# Patient Record
Sex: Female | Born: 1970 | Race: Black or African American | Hispanic: No | Smoking: Never smoker
Health system: Southern US, Community
[De-identification: ages and names within clinical notes are randomized; demographics above are authoritative.]

## PROBLEM LIST (undated history)

## (undated) ENCOUNTER — Inpatient Hospital Stay (HOSPITAL_COMMUNITY): Payer: Self-pay

## (undated) DIAGNOSIS — Z8669 Personal history of other diseases of the nervous system and sense organs: Secondary | ICD-10-CM

## (undated) DIAGNOSIS — I10 Essential (primary) hypertension: Secondary | ICD-10-CM

## (undated) DIAGNOSIS — E7800 Pure hypercholesterolemia, unspecified: Secondary | ICD-10-CM

## (undated) DIAGNOSIS — R569 Unspecified convulsions: Secondary | ICD-10-CM

## (undated) HISTORY — DX: Personal history of other diseases of the nervous system and sense organs: Z86.69

---

## 2009-12-31 ENCOUNTER — Emergency Department (HOSPITAL_COMMUNITY): Admission: EM | Admit: 2009-12-31 | Discharge: 2009-12-31 | Payer: Self-pay | Admitting: Emergency Medicine

## 2010-02-08 ENCOUNTER — Encounter: Admission: RE | Admit: 2010-02-08 | Discharge: 2010-02-08 | Payer: Self-pay | Admitting: Nephrology

## 2010-11-22 ENCOUNTER — Emergency Department (HOSPITAL_COMMUNITY)
Admission: EM | Admit: 2010-11-22 | Discharge: 2010-11-22 | Disposition: A | Discharge status: Home / Self Care | Payer: Medicaid Other | Attending: Emergency Medicine | Admitting: Emergency Medicine

## 2010-11-22 DIAGNOSIS — Z79899 Other long term (current) drug therapy: Secondary | ICD-10-CM | POA: Insufficient documentation

## 2010-11-22 DIAGNOSIS — G40909 Epilepsy, unspecified, not intractable, without status epilepticus: Secondary | ICD-10-CM | POA: Insufficient documentation

## 2010-11-22 DIAGNOSIS — R5381 Other malaise: Secondary | ICD-10-CM | POA: Insufficient documentation

## 2011-02-06 ENCOUNTER — Other Ambulatory Visit: Payer: Self-pay

## 2011-02-06 LAB — OB RESULTS CONSOLE RPR: RPR: NONREACTIVE

## 2011-02-06 LAB — OB RESULTS CONSOLE ABO/RH

## 2011-02-06 LAB — OB RESULTS CONSOLE HEPATITIS B SURFACE ANTIGEN: Hepatitis B Surface Ag: NEGATIVE

## 2011-02-06 LAB — OB RESULTS CONSOLE ANTIBODY SCREEN: Antibody Screen: NEGATIVE

## 2011-02-08 ENCOUNTER — Other Ambulatory Visit: Payer: Self-pay

## 2011-02-20 LAB — OB RESULTS CONSOLE GC/CHLAMYDIA
Chlamydia: NEGATIVE
Gonorrhea: NEGATIVE

## 2011-02-22 ENCOUNTER — Other Ambulatory Visit (HOSPITAL_COMMUNITY): Payer: Self-pay | Admitting: Nephrology

## 2011-02-22 DIAGNOSIS — O0990 Supervision of high risk pregnancy, unspecified, unspecified trimester: Secondary | ICD-10-CM

## 2011-02-28 ENCOUNTER — Other Ambulatory Visit (HOSPITAL_COMMUNITY): Payer: Medicaid Other

## 2011-03-07 ENCOUNTER — Ambulatory Visit (HOSPITAL_COMMUNITY): Payer: Medicaid Other

## 2011-03-13 ENCOUNTER — Encounter (HOSPITAL_COMMUNITY): Payer: Self-pay

## 2011-03-13 ENCOUNTER — Ambulatory Visit (HOSPITAL_COMMUNITY)
Admission: RE | Admit: 2011-03-13 | Discharge: 2011-03-13 | Disposition: A | Discharge status: Home / Self Care | Payer: Medicaid Other | Source: Ambulatory Visit | Attending: Obstetrics & Gynecology | Admitting: Obstetrics & Gynecology

## 2011-03-13 ENCOUNTER — Other Ambulatory Visit: Payer: Self-pay

## 2011-03-13 ENCOUNTER — Ambulatory Visit (HOSPITAL_COMMUNITY)
Admission: RE | Admit: 2011-03-13 | Discharge: 2011-03-13 | Disposition: A | Discharge status: Home / Self Care | Payer: Medicaid Other | Source: Ambulatory Visit | Attending: Obstetrics | Admitting: Obstetrics

## 2011-03-13 VITALS — BP 148/91 | HR 128 | Wt 184.0 lb

## 2011-03-13 DIAGNOSIS — O351XX0 Maternal care for (suspected) chromosomal abnormality in fetus, not applicable or unspecified: Secondary | ICD-10-CM | POA: Insufficient documentation

## 2011-03-13 DIAGNOSIS — O355XX0 Maternal care for (suspected) damage to fetus by drugs, not applicable or unspecified: Secondary | ICD-10-CM | POA: Insufficient documentation

## 2011-03-13 DIAGNOSIS — O0990 Supervision of high risk pregnancy, unspecified, unspecified trimester: Secondary | ICD-10-CM

## 2011-03-13 DIAGNOSIS — O3510X0 Maternal care for (suspected) chromosomal abnormality in fetus, unspecified, not applicable or unspecified: Secondary | ICD-10-CM | POA: Insufficient documentation

## 2011-03-13 DIAGNOSIS — Z3689 Encounter for other specified antenatal screening: Secondary | ICD-10-CM | POA: Insufficient documentation

## 2011-03-13 DIAGNOSIS — O09529 Supervision of elderly multigravida, unspecified trimester: Secondary | ICD-10-CM | POA: Insufficient documentation

## 2011-03-13 HISTORY — DX: Unspecified convulsions: R56.9

## 2011-03-15 DIAGNOSIS — O09529 Supervision of elderly multigravida, unspecified trimester: Secondary | ICD-10-CM | POA: Insufficient documentation

## 2011-03-15 NOTE — Progress Notes (Signed)
Genetic Counseling  High-Risk Gestation Note  Appointment Date:  03/13/11 Referred By: Coral Ceo, MD Date of Birth:  January 08, 1971  Pregnancy History: [id number] Estimated Date of Delivery: 09/17/11 Estimated Gestational Age: [redacted]w[redacted]d Attending: Dario Ave, MD  Thank you for sending Amanda Lozano for genetic counseling because of a maternal age of 40 y.o. at delivery. The patient was accompanied by her daughter today.      She was counseled regarding maternal age and the association with risk for chromosome conditions due to nondisjunction with aging of the ova.   We reviewed chromosomes, nondisjunction, and the associated 1 in 60 risk for fetal aneuploidy in the first trimester related to a maternal age of 6 at delivery.  She was counseled that the risk for aneuploidy decreases as gestational age increases, accounting for those pregnancies which spontaneously abort.  We specifically discussed Down syndrome (trisomy 66), trisomies 72 and 72, and sex chromosome aneuploidies (47,XXX and 47,XXY) including the common features and prognoses of each.   We reviewed available screening and diagnostic options.  Regarding screening tests, we discussed the options of First screen, Quad screen and ultrasound.  She understands that screening tests are used to modify a patient's a priori risk for aneuploidy, typically based on age.  This estimate provides a pregnancy specific risk assessment.  We also reviewed the availability of diagnostic options including CVS and amniocentesis.  We discussed the risks, limitations, and benefits of each.  After reviewing these options, Amanda Lozano elected to have First screen, but declined CVS and amniocentesis.  She wishes to pursue these options to help ascertain her pregnancy specific risks for aneuploidy.  She understands that ultrasound cannot rule out all birth defects or genetic syndromes. The patient was advised of this limitation and states she still does not want  diagnostic testing at this time.  However, she was counseled that 50-80% of fetuses with Down syndrome and up to 90% of fetuses with trisomies 13 and 18, when well visualized, have detectable anomalies or soft markers by ultrasound.   Amanda Lozano was provided with written information regarding sickle cell anemia (SCA) including the carrier frequency and incidence in the African-American population, the availability of carrier testing and prenatal diagnosis if indicated.  In addition, we discussed that hemoglobinopathies are routinely screened for as part of the [place] newborn screening panel.  Amanda Lozano reported that she previously had sickle cell screening, which was normal.   Both family histories were reviewed and found to be contributory for epilepsy in the patient. Epilepsy occurs in 1% of the population and can have many causes.  Approximately 80% of epilepsy is thought to be idiopathic while the remaining 20% is secondary to a variety of factors such as perinatal events, infections, trauma and genetic disease.  A specific diagnosis in an affected individual is necessary to accurately assess the risk for other family members to develop epilepsy.  In the absence of a known etiology, epilepsy is thought to be caused by a combination of genetic and environmental factors, called multifactorial inheritance. Recurrence risk for idiopathic epilepsy in offspring of affected individuals is approximately 4%, assuming multifactorial inheritance. Without further information regarding the provided family history, an accurate genetic risk cannot be calculated. Further genetic counseling is warranted if more information is obtained.   Amanda Lozano denied exposure to environmental toxins or chemical agents. She denied the use of alcohol, tobacco or street drugs. She denied significant viral illnesses during the course of her pregnancy.  Her medical and  surgical histories were contributory for epilepsy for which she is  treated with carbamazepine. Her last seizure was reportedly Amanda Lozano declined MFM consultation regarding this history. She reported that she is followed by a neurologist. Available study data have indicated that prenatal exposures to Tegretol (Carbamazepine) appear to be associated with an increased risk for a neural tube defect (NTD), such as spina bifida, above the general population risk of approximately 1 in 500.  Conflicting study data exist on whether or not the prenatal use of Tegretol also increase the chance for additional birth defects or possible developmental delays. Available screening and testing options in the current pregnancy include maternal serum AFP screening (up to approximately 80% detection of ONTDs), targeted ultrasound in the second trimester (approximately 90% detection of ONTDs, when well visualized) and amniocentesis (greater than 98% detection of ONTDs). Sometimes the maternal use of medications, dictated by a medical condition such as epilepsy, may even be more beneficial to a pregnancy than not taking the medication(s) at all.  In this instance, controlling Amanda Lozano's seizures is more beneficial to the patient and her pregnancy. She should not alter her medication without first consulting with her physician.   I counseled Amanda Lozano regarding the above risks and available options.  The approximate face-to-face time with the genetic counselor was 40 minutes.  Clydie Braun Kale Dols, MS, [facility] 03/15/2011

## 2011-03-20 ENCOUNTER — Telehealth (HOSPITAL_COMMUNITY): Payer: Self-pay | Admitting: MS"

## 2011-03-20 NOTE — Telephone Encounter (Signed)
First trimester screen results within normal range. Results to patient. Down syndrome risk reduced to 1 in 560, and Trisomy 18/13 risk reduced to 1 in 4,501. Patient happy with these results.

## 2011-03-25 ENCOUNTER — Inpatient Hospital Stay (HOSPITAL_COMMUNITY)
Admission: AD | Admit: 2011-03-25 | Discharge: 2011-03-25 | Disposition: A | Discharge status: Home / Self Care | Payer: Medicaid Other | Source: Ambulatory Visit | Attending: Obstetrics & Gynecology | Admitting: Obstetrics & Gynecology

## 2011-03-25 ENCOUNTER — Encounter (HOSPITAL_COMMUNITY): Payer: Self-pay

## 2011-03-25 DIAGNOSIS — O99891 Other specified diseases and conditions complicating pregnancy: Secondary | ICD-10-CM | POA: Insufficient documentation

## 2011-03-25 DIAGNOSIS — R51 Headache: Secondary | ICD-10-CM | POA: Insufficient documentation

## 2011-03-25 DIAGNOSIS — O26899 Other specified pregnancy related conditions, unspecified trimester: Secondary | ICD-10-CM

## 2011-03-25 LAB — URINE MICROSCOPIC-ADD ON

## 2011-03-25 LAB — URINALYSIS, ROUTINE W REFLEX MICROSCOPIC
Glucose, UA: NEGATIVE mg/dL
Specific Gravity, Urine: 1.02 (ref 1.005–1.030)

## 2011-03-25 MED ORDER — ACETAMINOPHEN 500 MG PO TABS
1000.0000 mg | ORAL_TABLET | Freq: Once | ORAL | Status: AC
  Administered 2011-03-25: 1000 mg via ORAL
  Filled 2011-03-25: qty 2

## 2011-03-25 NOTE — Progress Notes (Signed)
Onset of headache since yesterday has continued have headache has not taken anything concerned about what to take, has seizure disorder since 1997, 15 weeks, seizure free for 4 years.

## 2011-03-25 NOTE — ED Provider Notes (Signed)
History     Chief Complaint  Patient presents with  . Headache   HPI Headache started yesterday night, no vision changes or photophobia, wasn't sure what she could take. H/O seizure disorder, no seizure x 4 years.   OB History    Grav Para Term Preterm Abortions TAB SAB Ect Mult Living   5 3 3  0 1 0 1 0 0 3      Past Medical History  Diagnosis Date  . Seizures     No past surgical history on file.  No family history on file.  History  Substance Use Topics  . Smoking status: Never Smoker   . Smokeless tobacco: Not on file  . Alcohol Use: No    Allergies: No Known Allergies  Prescriptions prior to admission  Medication Sig Dispense Refill  . carbamazepine (TEGRETOL) 100 MG chewable tablet Chew by mouth 2 (two) times daily.        Marland Kitchen PRENATAL VITAMINS PO Take by mouth.          Review of Systems  Constitutional: Negative.   HENT: Negative for hearing loss, ear pain, nosebleeds and congestion.   Eyes: Negative for blurred vision, double vision and photophobia.  Respiratory: Negative.   Cardiovascular: Negative.   Gastrointestinal: Negative.   Genitourinary: Negative.   Musculoskeletal: Negative.   Neurological: Positive for headaches. Negative for dizziness, tingling, tremors, sensory change, speech change, focal weakness, seizures and loss of consciousness.  Psychiatric/Behavioral: Negative.    Physical Exam   Blood pressure 132/81, pulse 115, temperature 98.3 F (36.8 C), temperature source Oral, resp. rate 18, height 5\' 1"  (1.549 m), weight 83.28 kg (183 lb 9.6 oz), last menstrual period 12/11/2010.  Physical Exam  Nursing note and vitals reviewed. Constitutional: She is oriented to person, place, and time. She appears well-developed and well-nourished. No distress.  HENT:  Head: Normocephalic and atraumatic.  Eyes: Conjunctivae and EOM are normal. Pupils are equal, round, and reactive to light.  Cardiovascular: Normal rate.   Respiratory: Effort normal.    Musculoskeletal: Normal range of motion.  Neurological: She is alert and oriented to person, place, and time. No cranial nerve deficit. Coordination normal.  Skin: Skin is warm and dry.  Psychiatric: She has a normal mood and affect.    MAU Course  Procedures Tylenol 1000mg  for headache  Results for orders placed during the hospital encounter of 03/25/11 (from the past 24 hour(s))  URINALYSIS, ROUTINE W REFLEX MICROSCOPIC     Status: Abnormal   Collection Time   03/25/11  9:40 AM      Component Value Range   Color, Urine YELLOW  YELLOW    Appearance CLEAR  CLEAR    Specific Gravity, Urine 1.020  1.005 - 1.030    pH 7.5  5.0 - 8.0    Glucose, UA NEGATIVE  NEGATIVE (mg/dL)   Hgb urine dipstick TRACE (*) NEGATIVE    Bilirubin Urine NEGATIVE  NEGATIVE    Ketones, ur NEGATIVE  NEGATIVE (mg/dL)   Protein, ur NEGATIVE  NEGATIVE (mg/dL)   Urobilinogen, UA 0.2  0.0 - 1.0 (mg/dL)   Nitrite NEGATIVE  NEGATIVE    Leukocytes, UA NEGATIVE  NEGATIVE   URINE MICROSCOPIC-ADD ON     Status: Abnormal   Collection Time   03/25/11  9:40 AM      Component Value Range   Squamous Epithelial / LPF FEW (*) RARE    WBC, UA 0-2  <3 (WBC/hpf)   RBC / HPF 0-2  <  3 (RBC/hpf)   Bacteria, UA RARE  RARE      Assessment and Plan  40 y.o. [id number] at [redacted]w[redacted]d Headache - improved with tylenol Follow up as scheduled, precautions rev'd  Jusitn Salsgiver 03/25/2011, 9:58 AM

## 2011-05-08 NOTE — L&D Delivery Note (Signed)
Cesarean Section Procedure Note  Indications: malpresentation: Breech  Pre-operative Diagnosis: 39 week 2 day pregnancy.  Active labor.  Breech presentation.  Post-operative Diagnosis: same  Surgeon: Coral Ceo A   Assistants: Francoise Ceo  Anesthesia: Spinal anesthesia  ASA Class: 2 - E   Procedure Details   The patient was seen in the Holding Room. The risks, benefits, complications, treatment options, and expected outcomes were discussed with the patient.  The patient concurred with the proposed plan, giving informed consent.  The site of surgery properly noted/marked. The patient was taken to Operating Room # 1, identified as Amanda Lozano and the procedure verified as C-Section Delivery. A Time Out was held and the above information confirmed.  After induction of anesthesia, the patient was draped and prepped in the usual sterile manner. A Pfannenstiel incision was made and carried down through the subcutaneous tissue to the fascia. Fascial incision was made and extended transversely. The fascia was separated from the underlying rectus tissue superiorly and inferiorly. The peritoneum was identified and entered. Peritoneal incision was extended longitudinally. The utero-vesical peritoneal reflection was incised transversely and the bladder flap was bluntly freed from the lower uterine segment. A low transverse uterine incision was made. Delivered from breech Homero Fellers)  presentation was a 3465 gram Female with Apgar scores of 9 at one minute and 9 at five minutes. After the umbilical cord was clamped and cut cord blood was obtained for evaluation. The placenta was removed intact and appeared normal. The uterine outline, tubes and ovaries appeared normal. The uterine incision was closed with running locked sutures of 0 Monocryl.   Hemostasis was observed. Lavage was carried out until clear.  The peritoneum closed with running sutures of 2-0 Vicryl.  The fascia was then  reapproximated with running sutures of 0 Vicryl. The skin was reapproximated with Staples.  Instrument, sponge, and needle counts were correct prior the abdominal closure and at the conclusion of the case.   Findings: Viable female - Homero Fellers Breech   Estimated Blood Loss:  600 ml.         Drains: Foley to gravity.         Total IV Fluids:          Specimens: Placenta          Implants: None         Complications:  None; patient tolerated the procedure well.         Disposition: PACU - hemodynamically stable.         Condition: stable  Attending Attestation: I was present and scrubbed for the entire procedure.

## 2011-07-20 ENCOUNTER — Other Ambulatory Visit: Payer: Self-pay | Admitting: Obstetrics

## 2011-07-20 DIAGNOSIS — IMO0002 Reserved for concepts with insufficient information to code with codable children: Secondary | ICD-10-CM

## 2011-07-20 DIAGNOSIS — O99350 Diseases of the nervous system complicating pregnancy, unspecified trimester: Secondary | ICD-10-CM

## 2011-07-20 DIAGNOSIS — O09529 Supervision of elderly multigravida, unspecified trimester: Secondary | ICD-10-CM

## 2011-07-20 DIAGNOSIS — G40909 Epilepsy, unspecified, not intractable, without status epilepticus: Secondary | ICD-10-CM

## 2011-07-27 ENCOUNTER — Ambulatory Visit (HOSPITAL_COMMUNITY): Payer: Medicaid Other

## 2011-07-27 ENCOUNTER — Ambulatory Visit (HOSPITAL_COMMUNITY)
Admission: RE | Admit: 2011-07-27 | Discharge: 2011-07-27 | Disposition: A | Discharge status: Home / Self Care | Payer: Medicaid Other | Source: Ambulatory Visit | Attending: Obstetrics | Admitting: Obstetrics

## 2011-07-27 DIAGNOSIS — Z363 Encounter for antenatal screening for malformations: Secondary | ICD-10-CM | POA: Insufficient documentation

## 2011-07-27 DIAGNOSIS — IMO0002 Reserved for concepts with insufficient information to code with codable children: Secondary | ICD-10-CM

## 2011-07-27 DIAGNOSIS — O09529 Supervision of elderly multigravida, unspecified trimester: Secondary | ICD-10-CM

## 2011-07-27 DIAGNOSIS — Z1389 Encounter for screening for other disorder: Secondary | ICD-10-CM | POA: Insufficient documentation

## 2011-07-27 DIAGNOSIS — O3660X0 Maternal care for excessive fetal growth, unspecified trimester, not applicable or unspecified: Secondary | ICD-10-CM | POA: Insufficient documentation

## 2011-07-27 DIAGNOSIS — O355XX0 Maternal care for (suspected) damage to fetus by drugs, not applicable or unspecified: Secondary | ICD-10-CM | POA: Insufficient documentation

## 2011-07-27 DIAGNOSIS — G40909 Epilepsy, unspecified, not intractable, without status epilepticus: Secondary | ICD-10-CM

## 2011-07-27 DIAGNOSIS — O358XX0 Maternal care for other (suspected) fetal abnormality and damage, not applicable or unspecified: Secondary | ICD-10-CM | POA: Insufficient documentation

## 2011-08-30 ENCOUNTER — Inpatient Hospital Stay (HOSPITAL_COMMUNITY)
Admission: AD | Admit: 2011-08-30 | Discharge: 2011-08-30 | Disposition: A | Discharge status: Home / Self Care | Payer: Medicaid Other | Source: Ambulatory Visit | Attending: Obstetrics & Gynecology | Admitting: Obstetrics & Gynecology

## 2011-08-30 ENCOUNTER — Encounter (HOSPITAL_COMMUNITY): Payer: Self-pay

## 2011-08-30 DIAGNOSIS — O139 Gestational [pregnancy-induced] hypertension without significant proteinuria, unspecified trimester: Secondary | ICD-10-CM

## 2011-08-30 DIAGNOSIS — O133 Gestational [pregnancy-induced] hypertension without significant proteinuria, third trimester: Secondary | ICD-10-CM

## 2011-08-30 DIAGNOSIS — O10019 Pre-existing essential hypertension complicating pregnancy, unspecified trimester: Secondary | ICD-10-CM | POA: Insufficient documentation

## 2011-08-30 LAB — COMPREHENSIVE METABOLIC PANEL
AST: 22 U/L (ref 0–37)
Albumin: 2.8 g/dL — ABNORMAL LOW (ref 3.5–5.2)
Alkaline Phosphatase: 184 U/L — ABNORMAL HIGH (ref 39–117)
Chloride: 99 mEq/L (ref 96–112)
Creatinine, Ser: 0.51 mg/dL (ref 0.50–1.10)
Potassium: 3.2 mEq/L — ABNORMAL LOW (ref 3.5–5.1)
Total Bilirubin: 0.2 mg/dL — ABNORMAL LOW (ref 0.3–1.2)

## 2011-08-30 LAB — CBC
Platelets: 216 10*3/uL (ref 150–400)
RDW: 14.5 % (ref 11.5–15.5)
WBC: 7.9 10*3/uL (ref 4.0–10.5)

## 2011-08-30 LAB — LACTATE DEHYDROGENASE: LDH: 173 U/L (ref 94–250)

## 2011-08-30 NOTE — MAU Provider Note (Signed)
History     CSN: [id number]  Arrival date and time: 08/30/11 1248   First Provider Initiated Contact with Patient 08/30/11 1407      Chief Complaint  Patient presents with  . Pre-Eclampsia   HPI This is a 41 y.o. female at [redacted]w[redacted]d who presents from office with hypertension. Denies headache or visual changes. No edema.  OB History    Grav Para Term Preterm Abortions TAB SAB Ect Mult Living   5 3 3  0 1 0 1 0 0 3      Past Medical History  Diagnosis Date  . Seizures     Past Surgical History  Procedure Date  . No past surgeries     Family History  Problem Relation Age of Onset  . Hypertension Father     History  Substance Use Topics  . Smoking status: Never Smoker   . Smokeless tobacco: Not on file  . Alcohol Use: No    Allergies: No Known Allergies  Prescriptions prior to admission  Medication Sig Dispense Refill  . carbamazepine (TEGRETOL) 100 MG chewable tablet Chew by mouth 2 (two) times daily.        . folic acid (FOLVITE) 1 MG tablet Take 1 mg by mouth daily.        . ondansetron (ZOFRAN) 4 MG tablet Take 4 mg by mouth every 8 (eight) hours as needed. For nausea.       . Prenatal Vit-Fe Fumarate-FA (PRENATAL MULTIVITAMIN) TABS Take 1 tablet by mouth daily.        ROS As listed in HPI  Physical Exam   Blood pressure 138/84, pulse 110, resp. rate 18, last menstrual period 12/11/2010.  Physical Exam  Constitutional: She is oriented to person, place, and time. She appears well-developed and well-nourished. No distress.  HENT:  Head: Normocephalic.  Cardiovascular: Normal rate.   Respiratory: Effort normal.  GI: Soft. She exhibits no distension. There is no tenderness.  Genitourinary:       FHR reactive Irregular mild contractions\  Cervix exam deferred  Musculoskeletal: Normal range of motion. She exhibits no edema.  Neurological: She is alert and oriented to person, place, and time. She has normal reflexes.  Skin: Skin is warm and dry.    Psychiatric: She has a normal mood and affect.   Filed Vitals:   08/30/11 1417  BP: 122/77  Pulse: 108  Resp:    Results for orders placed during the hospital encounter of 08/30/11 (from the past 24 hour(s))  CBC     Status: Abnormal   Collection Time   08/30/11  1:11 PM      Component Value Range   WBC 7.9  4.0 - 10.5 (K/uL)   RBC 4.21  3.87 - 5.11 (MIL/uL)   Hemoglobin 11.6 (*) 12.0 - 15.0 (g/dL)   HCT 56.2 (*) 13.0 - 46.0 (%)   MCV 84.3  78.0 - 100.0 (fL)   MCH 27.6  26.0 - 34.0 (pg)   MCHC 32.7  30.0 - 36.0 (g/dL)   RDW 86.5  78.4 - 69.6 (%)   Platelets 216  150 - 400 (K/uL)  COMPREHENSIVE METABOLIC PANEL     Status: Abnormal   Collection Time   08/30/11  1:11 PM      Component Value Range   Sodium 134 (*) 135 - 145 (mEq/L)   Potassium 3.2 (*) 3.5 - 5.1 (mEq/L)   Chloride 99  96 - 112 (mEq/L)   CO2 21  19 - 32 (mEq/L)  Glucose, Bld 132 (*) 70 - 99 (mg/dL)   BUN 5 (*) 6 - 23 (mg/dL)   Creatinine, Ser 1.61  0.50 - 1.10 (mg/dL)   Calcium 9.0  8.4 - 09.6 (mg/dL)   Total Protein 7.8  6.0 - 8.3 (g/dL)   Albumin 2.8 (*) 3.5 - 5.2 (g/dL)   AST 22  0 - 37 (U/L)   ALT 15  0 - 35 (U/L)   Alkaline Phosphatase 184 (*) 39 - 117 (U/L)   Total Bilirubin 0.2 (*) 0.3 - 1.2 (mg/dL)   GFR calc non Af Amer >90  >90 (mL/min)   GFR calc Af Amer >90  >90 (mL/min)  URIC ACID     Status: Normal   Collection Time   08/30/11  1:11 PM      Component Value Range   Uric Acid, Serum 4.1  2.4 - 7.0 (mg/dL)  LACTATE DEHYDROGENASE     Status: Normal   Collection Time   08/30/11  1:11 PM      Component Value Range   LDH 173  94 - 250 (U/L)     MAU Course  Procedures  Assessment and Plan  A:  SIUP at [redacted]w[redacted]d      Labile hypertension       No evidence of preeclampsia P:  Discussed with DR Tamela Oddi      Discharge home      Labor and [facility] precautions  [facility] 08/30/2011, 2:31 PM

## 2011-08-30 NOTE — Discharge Instructions (Signed)

## 2011-09-03 ENCOUNTER — Other Ambulatory Visit: Payer: Self-pay | Admitting: Obstetrics

## 2011-09-04 ENCOUNTER — Telehealth (HOSPITAL_COMMUNITY): Payer: Self-pay | Admitting: *Deleted

## 2011-09-04 ENCOUNTER — Encounter (HOSPITAL_COMMUNITY): Payer: Self-pay | Admitting: *Deleted

## 2011-09-04 NOTE — Telephone Encounter (Signed)
Preadmission screen  

## 2011-09-06 ENCOUNTER — Inpatient Hospital Stay (HOSPITAL_COMMUNITY)
Admission: AD | Admit: 2011-09-06 | Discharge: 2011-09-06 | Disposition: A | Discharge status: Home / Self Care | Payer: Medicaid Other | Source: Ambulatory Visit | Attending: Obstetrics | Admitting: Obstetrics

## 2011-09-06 ENCOUNTER — Encounter (HOSPITAL_COMMUNITY): Payer: Self-pay | Admitting: *Deleted

## 2011-09-06 DIAGNOSIS — O479 False labor, unspecified: Secondary | ICD-10-CM | POA: Insufficient documentation

## 2011-09-06 NOTE — MAU Note (Signed)
Pt states she woke up @ 0400 having strong uc's, has had 3 BM's.  Pt denies bleeding or LOF.

## 2011-09-06 NOTE — Discharge Instructions (Signed)

## 2011-09-06 NOTE — MAU Note (Signed)
Pains started at 0400, no bleeding, no leaking. Frequent contractions and bowel movements.

## 2011-09-10 ENCOUNTER — Encounter (HOSPITAL_COMMUNITY): Payer: Self-pay

## 2011-09-10 ENCOUNTER — Inpatient Hospital Stay (HOSPITAL_COMMUNITY)
Admission: RE | Admit: 2011-09-10 | Discharge: 2011-09-14 | DRG: 766 | Disposition: A | Discharge status: Home / Self Care | Payer: Medicaid Other | Source: Ambulatory Visit | Attending: Obstetrics | Admitting: Obstetrics

## 2011-09-10 VITALS — BP 136/90 | HR 105 | Temp 98.3°F | Resp 16 | Ht 61.0 in | Wt 185.1 lb

## 2011-09-10 DIAGNOSIS — O321XX0 Maternal care for breech presentation, not applicable or unspecified: Principal | ICD-10-CM | POA: Diagnosis present

## 2011-09-10 LAB — CBC
HCT: 35.5 % — ABNORMAL LOW (ref 36.0–46.0)
Hemoglobin: 11.5 g/dL — ABNORMAL LOW (ref 12.0–15.0)
MCHC: 32.4 g/dL (ref 30.0–36.0)
MCV: 83.7 fL (ref 78.0–100.0)

## 2011-09-10 MED ORDER — HYDROXYZINE HCL 50 MG/ML IM SOLN: 50.0000 mg | Freq: Four times a day (QID) | INTRAMUSCULAR | Status: DC | PRN

## 2011-09-10 MED ORDER — OXYTOCIN BOLUS FROM INFUSION
500.0000 mL | Freq: Once | INTRAVENOUS | Status: DC
  Filled 2011-09-10: qty 500

## 2011-09-10 MED ORDER — PROCHLORPERAZINE EDISYLATE 5 MG/ML IJ SOLN
10.0000 mg | Freq: Four times a day (QID) | INTRAMUSCULAR | Status: DC | PRN
  Filled 2011-09-10: qty 2

## 2011-09-10 MED ORDER — IBUPROFEN 600 MG PO TABS: 600.0000 mg | ORAL_TABLET | Freq: Four times a day (QID) | ORAL | Status: DC | PRN

## 2011-09-10 MED ORDER — LACTATED RINGERS IV SOLN
INTRAVENOUS | Status: DC
  Administered 2011-09-10 – 2011-09-11 (×2): via INTRAVENOUS

## 2011-09-10 MED ORDER — LACTATED RINGERS IV SOLN: 500.0000 mL | INTRAVENOUS | Status: DC | PRN

## 2011-09-10 MED ORDER — OXYTOCIN 20 UNITS IN LACTATED RINGERS INFUSION - SIMPLE: 125.0000 mL/h | Freq: Once | INTRAVENOUS | Status: DC

## 2011-09-10 MED ORDER — CITRIC ACID-SODIUM CITRATE 334-500 MG/5ML PO SOLN
30.0000 mL | ORAL | Status: DC | PRN
  Administered 2011-09-11: 30 mL via ORAL
  Filled 2011-09-10: qty 15

## 2011-09-10 MED ORDER — ONDANSETRON HCL 4 MG/2ML IJ SOLN: 4.0000 mg | Freq: Four times a day (QID) | INTRAMUSCULAR | Status: DC | PRN

## 2011-09-10 MED ORDER — LIDOCAINE HCL (PF) 1 % IJ SOLN: 30.0000 mL | INTRAMUSCULAR | Status: DC | PRN

## 2011-09-10 MED ORDER — OXYCODONE-ACETAMINOPHEN 5-325 MG PO TABS: 1.0000 | ORAL_TABLET | ORAL | Status: DC | PRN

## 2011-09-10 MED ORDER — HYDROXYZINE HCL 50 MG PO TABS: 50.0000 mg | ORAL_TABLET | Freq: Four times a day (QID) | ORAL | Status: DC | PRN

## 2011-09-10 MED ORDER — ACETAMINOPHEN 325 MG PO TABS: 650.0000 mg | ORAL_TABLET | ORAL | Status: DC | PRN

## 2011-09-10 MED ORDER — CARBAMAZEPINE 100 MG PO CHEW
200.0000 mg | CHEWABLE_TABLET | Freq: Every day | ORAL | Status: DC
  Administered 2011-09-11 – 2011-09-13 (×3): 200 mg via ORAL
  Filled 2011-09-10 (×5): qty 2

## 2011-09-10 MED ORDER — CARBAMAZEPINE 100 MG PO CHEW
100.0000 mg | CHEWABLE_TABLET | Freq: Every day | ORAL | Status: DC
Start: 2011-09-11 — End: 2011-09-14
  Administered 2011-09-12 – 2011-09-14 (×3): 100 mg via ORAL
  Filled 2011-09-10 (×5): qty 1

## 2011-09-10 MED ORDER — BUTORPHANOL TARTRATE 2 MG/ML IJ SOLN: 1.0000 mg | INTRAMUSCULAR | Status: DC | PRN

## 2011-09-10 MED ORDER — TERBUTALINE SULFATE 1 MG/ML IJ SOLN
0.2500 mg | Freq: Once | INTRAMUSCULAR | Status: AC | PRN
  Filled 2011-09-10: qty 1

## 2011-09-10 MED ORDER — MISOPROSTOL 25 MCG QUARTER TABLET
25.0000 ug | ORAL_TABLET | ORAL | Status: DC | PRN
  Administered 2011-09-10 – 2011-09-11 (×2): 25 ug via VAGINAL
  Filled 2011-09-10 (×2): qty 0.25

## 2011-09-11 ENCOUNTER — Encounter (HOSPITAL_COMMUNITY)
Admission: RE | Disposition: A | Discharge status: Home / Self Care | Payer: Self-pay | Source: Ambulatory Visit | Attending: Obstetrics

## 2011-09-11 ENCOUNTER — Inpatient Hospital Stay (HOSPITAL_COMMUNITY): Payer: Medicaid Other | Admitting: Anesthesiology

## 2011-09-11 ENCOUNTER — Inpatient Hospital Stay (HOSPITAL_COMMUNITY): Payer: Medicaid Other

## 2011-09-11 ENCOUNTER — Encounter (HOSPITAL_COMMUNITY): Payer: Self-pay

## 2011-09-11 ENCOUNTER — Encounter (HOSPITAL_COMMUNITY): Payer: Self-pay | Admitting: Anesthesiology

## 2011-09-11 LAB — ABO/RH: ABO/RH(D): O POS

## 2011-09-11 SURGERY — Surgical Case
Anesthesia: Spinal | Site: Abdomen | Wound class: Clean Contaminated

## 2011-09-11 MED ORDER — LANOLIN HYDROUS EX OINT: TOPICAL_OINTMENT | CUTANEOUS | Status: DC | PRN

## 2011-09-11 MED ORDER — MEPERIDINE HCL 25 MG/ML IJ SOLN: 6.2500 mg | INTRAMUSCULAR | Status: DC | PRN

## 2011-09-11 MED ORDER — HYDROMORPHONE HCL PF 1 MG/ML IJ SOLN
0.2500 mg | INTRAMUSCULAR | Status: DC | PRN
  Administered 2011-09-11: 0.5 mg via INTRAVENOUS

## 2011-09-11 MED ORDER — PHENYLEPHRINE 40 MCG/ML (10ML) SYRINGE FOR IV PUSH (FOR BLOOD PRESSURE SUPPORT)
PREFILLED_SYRINGE | INTRAVENOUS | Status: AC
  Filled 2011-09-11: qty 5

## 2011-09-11 MED ORDER — NALOXONE HCL 0.4 MG/ML IJ SOLN: 0.4000 mg | INTRAMUSCULAR | Status: DC | PRN

## 2011-09-11 MED ORDER — TETANUS-DIPHTH-ACELL PERTUSSIS 5-2.5-18.5 LF-MCG/0.5 IM SUSP
0.5000 mL | Freq: Once | INTRAMUSCULAR | Status: AC
  Administered 2011-09-12: 0.5 mL via INTRAMUSCULAR
  Filled 2011-09-11: qty 0.5

## 2011-09-11 MED ORDER — ONDANSETRON HCL 4 MG/2ML IJ SOLN
INTRAMUSCULAR | Status: AC
  Filled 2011-09-11: qty 2

## 2011-09-11 MED ORDER — ONDANSETRON HCL 4 MG PO TABS: 4.0000 mg | ORAL_TABLET | ORAL | Status: DC | PRN

## 2011-09-11 MED ORDER — FENTANYL CITRATE 0.05 MG/ML IJ SOLN
INTRAMUSCULAR | Status: AC
  Filled 2011-09-11: qty 2

## 2011-09-11 MED ORDER — MORPHINE SULFATE (PF) 0.5 MG/ML IJ SOLN
INTRAMUSCULAR | Status: DC | PRN
  Administered 2011-09-11: 100 ug via EPIDURAL

## 2011-09-11 MED ORDER — ONDANSETRON HCL 4 MG/2ML IJ SOLN
INTRAMUSCULAR | Status: DC | PRN
  Administered 2011-09-11: 4 mg via INTRAVENOUS

## 2011-09-11 MED ORDER — PRENATAL MULTIVITAMIN CH
1.0000 | ORAL_TABLET | Freq: Every day | ORAL | Status: DC
  Administered 2011-09-11 – 2011-09-14 (×4): 1 via ORAL
  Filled 2011-09-11 (×4): qty 1

## 2011-09-11 MED ORDER — SENNOSIDES-DOCUSATE SODIUM 8.6-50 MG PO TABS
2.0000 | ORAL_TABLET | Freq: Every day | ORAL | Status: DC
  Administered 2011-09-11 – 2011-09-13 (×3): 2 via ORAL

## 2011-09-11 MED ORDER — MAGNESIUM SULFATE 40 G IN LACTATED RINGERS - SIMPLE
2.0000 g/h | INTRAVENOUS | Status: DC
  Administered 2011-09-11 – 2011-09-12 (×2): 2 g/h via INTRAVENOUS
  Filled 2011-09-11 (×2): qty 500

## 2011-09-11 MED ORDER — KETOROLAC TROMETHAMINE 30 MG/ML IJ SOLN: 15.0000 mg | Freq: Once | INTRAMUSCULAR | Status: DC | PRN

## 2011-09-11 MED ORDER — MEDROXYPROGESTERONE ACETATE 150 MG/ML IM SUSP: 150.0000 mg | INTRAMUSCULAR | Status: DC | PRN

## 2011-09-11 MED ORDER — OXYTOCIN 10 UNIT/ML IJ SOLN
20.0000 [IU] | INTRAVENOUS | Status: DC | PRN
  Administered 2011-09-11: 20 [IU] via INTRAVENOUS

## 2011-09-11 MED ORDER — KETOROLAC TROMETHAMINE 30 MG/ML IJ SOLN: 30.0000 mg | Freq: Four times a day (QID) | INTRAMUSCULAR | Status: AC | PRN

## 2011-09-11 MED ORDER — ZOLPIDEM TARTRATE 5 MG PO TABS: 5.0000 mg | ORAL_TABLET | Freq: Every evening | ORAL | Status: DC | PRN

## 2011-09-11 MED ORDER — DIPHENHYDRAMINE HCL 50 MG/ML IJ SOLN: 12.5000 mg | INTRAMUSCULAR | Status: DC | PRN

## 2011-09-11 MED ORDER — LABETALOL HCL 5 MG/ML IV SOLN
20.0000 mg | Freq: Once | INTRAVENOUS | Status: AC
  Administered 2011-09-11: 20 mg via INTRAVENOUS
  Filled 2011-09-11: qty 4

## 2011-09-11 MED ORDER — PHENYLEPHRINE HCL 10 MG/ML IJ SOLN
INTRAMUSCULAR | Status: DC | PRN
  Administered 2011-09-11: 40 ug via INTRAVENOUS
  Administered 2011-09-11: 80 ug via INTRAVENOUS
  Administered 2011-09-11: 40 ug via INTRAVENOUS
  Administered 2011-09-11 (×4): 80 ug via INTRAVENOUS
  Administered 2011-09-11: 40 ug via INTRAVENOUS
  Administered 2011-09-11: 80 ug via INTRAVENOUS

## 2011-09-11 MED ORDER — MAGNESIUM SULFATE BOLUS VIA INFUSION
4.0000 g | Freq: Once | INTRAVENOUS | Status: AC
  Administered 2011-09-11: 4 g via INTRAVENOUS
  Filled 2011-09-11: qty 500

## 2011-09-11 MED ORDER — KETOROLAC TROMETHAMINE 60 MG/2ML IM SOLN
INTRAMUSCULAR | Status: AC
  Administered 2011-09-11: 60 mg via INTRAMUSCULAR
  Filled 2011-09-11: qty 2

## 2011-09-11 MED ORDER — PROMETHAZINE HCL 25 MG/ML IJ SOLN: 6.2500 mg | INTRAMUSCULAR | Status: DC | PRN

## 2011-09-11 MED ORDER — METOCLOPRAMIDE HCL 5 MG/ML IJ SOLN: 10.0000 mg | Freq: Three times a day (TID) | INTRAMUSCULAR | Status: DC | PRN

## 2011-09-11 MED ORDER — SIMETHICONE 80 MG PO CHEW
80.0000 mg | CHEWABLE_TABLET | ORAL | Status: DC | PRN
  Administered 2011-09-12: 80 mg via ORAL

## 2011-09-11 MED ORDER — FENTANYL CITRATE 0.05 MG/ML IJ SOLN
INTRAMUSCULAR | Status: DC | PRN
  Administered 2011-09-11: 12.5 ug via INTRATHECAL

## 2011-09-11 MED ORDER — SCOPOLAMINE 1 MG/3DAYS TD PT72
MEDICATED_PATCH | TRANSDERMAL | Status: AC
  Filled 2011-09-11: qty 1

## 2011-09-11 MED ORDER — NALBUPHINE HCL 10 MG/ML IJ SOLN
5.0000 mg | INTRAMUSCULAR | Status: DC | PRN
  Filled 2011-09-11: qty 1

## 2011-09-11 MED ORDER — SODIUM CHLORIDE 0.9 % IJ SOLN: 3.0000 mL | INTRAMUSCULAR | Status: DC | PRN

## 2011-09-11 MED ORDER — KETOROLAC TROMETHAMINE 30 MG/ML IJ SOLN
30.0000 mg | Freq: Four times a day (QID) | INTRAMUSCULAR | Status: AC | PRN
  Administered 2011-09-11: 30 mg via INTRAVENOUS
  Filled 2011-09-11: qty 1

## 2011-09-11 MED ORDER — SCOPOLAMINE 1 MG/3DAYS TD PT72
1.0000 | MEDICATED_PATCH | Freq: Once | TRANSDERMAL | Status: AC
  Administered 2011-09-11: 1.5 mg via TRANSDERMAL

## 2011-09-11 MED ORDER — OXYCODONE-ACETAMINOPHEN 5-325 MG PO TABS
2.0000 | ORAL_TABLET | Freq: Once | ORAL | Status: AC
  Administered 2011-09-11: 2 via ORAL

## 2011-09-11 MED ORDER — CEFAZOLIN SODIUM 1-5 GM-% IV SOLN
INTRAVENOUS | Status: AC
  Filled 2011-09-11: qty 100

## 2011-09-11 MED ORDER — SODIUM CHLORIDE 0.9 % IV SOLN
1.0000 ug/kg/h | INTRAVENOUS | Status: DC | PRN
  Filled 2011-09-11: qty 2.5

## 2011-09-11 MED ORDER — METHYLERGONOVINE MALEATE 0.2 MG PO TABS: 0.2000 mg | ORAL_TABLET | ORAL | Status: DC | PRN

## 2011-09-11 MED ORDER — OXYTOCIN 10 UNIT/ML IJ SOLN
INTRAMUSCULAR | Status: AC
  Filled 2011-09-11: qty 2

## 2011-09-11 MED ORDER — MORPHINE SULFATE 0.5 MG/ML IJ SOLN
INTRAMUSCULAR | Status: AC
  Filled 2011-09-11: qty 10

## 2011-09-11 MED ORDER — DIPHENHYDRAMINE HCL 25 MG PO CAPS: 25.0000 mg | ORAL_CAPSULE | ORAL | Status: DC | PRN

## 2011-09-11 MED ORDER — METHYLERGONOVINE MALEATE 0.2 MG/ML IJ SOLN: 0.2000 mg | INTRAMUSCULAR | Status: DC | PRN

## 2011-09-11 MED ORDER — HYDROMORPHONE HCL PF 1 MG/ML IJ SOLN
INTRAMUSCULAR | Status: AC
  Filled 2011-09-11: qty 1

## 2011-09-11 MED ORDER — OXYCODONE-ACETAMINOPHEN 5-325 MG PO TABS
1.0000 | ORAL_TABLET | ORAL | Status: DC | PRN
  Administered 2011-09-13 – 2011-09-14 (×2): 1 via ORAL
  Filled 2011-09-11: qty 1
  Filled 2011-09-11: qty 2
  Filled 2011-09-11: qty 1

## 2011-09-11 MED ORDER — DIPHENHYDRAMINE HCL 50 MG/ML IJ SOLN: 25.0000 mg | INTRAMUSCULAR | Status: DC | PRN

## 2011-09-11 MED ORDER — EPHEDRINE 5 MG/ML INJ
INTRAVENOUS | Status: AC
  Filled 2011-09-11: qty 10

## 2011-09-11 MED ORDER — IBUPROFEN 600 MG PO TABS
600.0000 mg | ORAL_TABLET | Freq: Four times a day (QID) | ORAL | Status: DC
  Administered 2011-09-12 – 2011-09-14 (×11): 600 mg via ORAL
  Filled 2011-09-11 (×11): qty 1

## 2011-09-11 MED ORDER — DIBUCAINE 1 % RE OINT: 1.0000 "application " | TOPICAL_OINTMENT | RECTAL | Status: DC | PRN

## 2011-09-11 MED ORDER — ONDANSETRON HCL 4 MG/2ML IJ SOLN: 4.0000 mg | Freq: Three times a day (TID) | INTRAMUSCULAR | Status: DC | PRN

## 2011-09-11 MED ORDER — BENZOCAINE-MENTHOL 20-0.5 % EX AERO: 1.0000 "application " | INHALATION_SPRAY | CUTANEOUS | Status: DC | PRN

## 2011-09-11 MED ORDER — CEFAZOLIN SODIUM 1-5 GM-% IV SOLN
INTRAVENOUS | Status: DC | PRN
  Administered 2011-09-11: 2 g via INTRAVENOUS

## 2011-09-11 MED ORDER — LACTATED RINGERS IV SOLN
INTRAVENOUS | Status: DC | PRN
  Administered 2011-09-11 (×2): via INTRAVENOUS

## 2011-09-11 MED ORDER — ONDANSETRON HCL 4 MG/2ML IJ SOLN: 4.0000 mg | INTRAMUSCULAR | Status: DC | PRN

## 2011-09-11 MED ORDER — BUPIVACAINE HCL 0.75 % IJ SOLN
INTRAMUSCULAR | Status: DC | PRN
  Administered 2011-09-11: 1.4 mL

## 2011-09-11 MED ORDER — DIPHENHYDRAMINE HCL 25 MG PO CAPS: 25.0000 mg | ORAL_CAPSULE | Freq: Four times a day (QID) | ORAL | Status: DC | PRN

## 2011-09-11 MED ORDER — TERBUTALINE SULFATE 1 MG/ML IJ SOLN
0.2500 mg | Freq: Once | INTRAMUSCULAR | Status: AC
  Administered 2011-09-11: 0.25 mg via SUBCUTANEOUS

## 2011-09-11 MED ORDER — OXYTOCIN 20 UNITS IN LACTATED RINGERS INFUSION - SIMPLE
125.0000 mL/h | INTRAVENOUS | Status: DC | PRN
  Filled 2011-09-11: qty 1000

## 2011-09-11 MED ORDER — LACTATED RINGERS IV SOLN
INTRAVENOUS | Status: DC
  Administered 2011-09-12 – 2011-09-13 (×3): via INTRAVENOUS

## 2011-09-11 MED ORDER — WITCH HAZEL-GLYCERIN EX PADS: 1.0000 "application " | MEDICATED_PAD | CUTANEOUS | Status: DC | PRN

## 2011-09-11 MED ORDER — KETOROLAC TROMETHAMINE 60 MG/2ML IM SOLN
60.0000 mg | Freq: Once | INTRAMUSCULAR | Status: AC | PRN
  Administered 2011-09-11: 60 mg via INTRAMUSCULAR

## 2011-09-11 MED ORDER — OXYTOCIN 20 UNITS IN LACTATED RINGERS INFUSION - SIMPLE
INTRAVENOUS | Status: AC
  Administered 2011-09-11: 20 [IU]
  Filled 2011-09-11: qty 1000

## 2011-09-11 SURGICAL SUPPLY — 27 items
CLOTH BEACON ORANGE TIMEOUT ST (SAFETY) ×2 IMPLANT
DERMABOND ADVANCED (GAUZE/BANDAGES/DRESSINGS) ×1
DERMABOND ADVANCED .7 DNX12 (GAUZE/BANDAGES/DRESSINGS) ×1 IMPLANT
DRSG COVADERM 4X10 (GAUZE/BANDAGES/DRESSINGS) ×2 IMPLANT
ELECT REM PT RETURN 9FT ADLT (ELECTROSURGICAL) ×2
ELECTRODE REM PT RTRN 9FT ADLT (ELECTROSURGICAL) ×1 IMPLANT
EXTRACTOR VACUUM M CUP 4 TUBE (SUCTIONS) IMPLANT
GLOVE BIO SURGEON STRL SZ8.5 (GLOVE) ×4 IMPLANT
GOWN PREVENTION PLUS LG XLONG (DISPOSABLE) ×4 IMPLANT
GOWN PREVENTION PLUS XXLARGE (GOWN DISPOSABLE) ×2 IMPLANT
KIT ABG SYR 3ML LUER SLIP (SYRINGE) IMPLANT
NEEDLE HYPO 25X5/8 SAFETYGLIDE (NEEDLE) ×2 IMPLANT
NS IRRIG 1000ML POUR BTL (IV SOLUTION) ×2 IMPLANT
PACK C SECTION WH (CUSTOM PROCEDURE TRAY) ×2 IMPLANT
SLEEVE SCD COMPRESS KNEE MED (MISCELLANEOUS) IMPLANT
SUT CHROMIC 0 CT 802H (SUTURE) ×2 IMPLANT
SUT CHROMIC 1 CTX 36 (SUTURE) ×4 IMPLANT
SUT CHROMIC 2 0 SH (SUTURE) ×2 IMPLANT
SUT GUT PLAIN 0 CT-3 TAN 27 (SUTURE) IMPLANT
SUT MON AB 4-0 PS1 27 (SUTURE) ×2 IMPLANT
SUT VIC AB 0 CT1 18XCR BRD8 (SUTURE) IMPLANT
SUT VIC AB 0 CT1 8-18 (SUTURE)
SUT VIC AB 0 CTX 36 (SUTURE) ×2
SUT VIC AB 0 CTX36XBRD ANBCTRL (SUTURE) ×2 IMPLANT
TOWEL OR 17X24 6PK STRL BLUE (TOWEL DISPOSABLE) ×4 IMPLANT
TRAY FOLEY CATH 14FR (SET/KITS/TRAYS/PACK) ×2 IMPLANT
WATER STERILE IRR 1000ML POUR (IV SOLUTION) ×2 IMPLANT

## 2011-09-11 NOTE — Anesthesia Procedure Notes (Signed)
Spinal  Patient location during procedure: OR Start time: 09/11/2011 6:45 AM End time: 09/11/2011 6:47 AM Staffing Anesthesiologist: Sandrea Hughs Preanesthetic Checklist Completed: patient identified, site marked, surgical consent, pre-op evaluation, timeout performed, IV checked, risks and benefits discussed and monitors and equipment checked Spinal Block Patient position: sitting Prep: DuraPrep Patient monitoring: heart rate, cardiac monitor, continuous pulse ox and blood pressure Approach: midline Location: L3-4 Injection technique: single-shot Needle Needle type: Sprotte  Needle gauge: 24 G Needle length: 9 cm Needle insertion depth: 5 cm Assessment Sensory level: T4

## 2011-09-11 NOTE — Progress Notes (Signed)
CM / UR chart review completed.  

## 2011-09-11 NOTE — Anesthesia Preprocedure Evaluation (Signed)
Anesthesia Evaluation  Patient identified by MRN, date of birth, ID band Patient awake    Reviewed: Allergy & Precautions, H&P , NPO status , Patient's Chart, lab work & pertinent test results  Airway Mallampati: II TM Distance: >3 FB Neck ROM: full    Dental No notable dental hx.    Pulmonary neg pulmonary ROS,  breath sounds clear to auscultation  Pulmonary exam normal       Cardiovascular negative cardio ROS      Neuro/Psych negative psych ROS   GI/Hepatic negative GI ROS, Neg liver ROS,   Endo/Other  Morbid obesity  Renal/GU negative Renal ROS  negative genitourinary   Musculoskeletal negative musculoskeletal ROS (+)   Abdominal (+) + obese,   Peds negative pediatric ROS (+)  Hematology negative hematology ROS (+)   Anesthesia Other Findings   Reproductive/Obstetrics (+) Pregnancy                           Anesthesia Physical  Anesthesia Plan  ASA: III  Anesthesia Plan: Spinal   Post-op Pain Management:    Induction:   Airway Management Planned:   Additional Equipment:   Intra-op Plan:   Post-operative Plan:   Informed Consent: I have reviewed the patients History and Physical, chart, labs and discussed the procedure including the risks, benefits and alternatives for the proposed anesthesia with the patient or authorized representative who has indicated his/her understanding and acceptance.     Plan Discussed with: CRNA and Surgeon  Anesthesia Plan Comments:         Anesthesia Quick Evaluation  

## 2011-09-11 NOTE — H&P (Signed)
Amanda Lozano is a 41 y.o. female presenting for IOL. Maternal Medical History:  Fetal activity: Perceived fetal activity is normal.   Last perceived fetal movement was within the past hour.    Prenatal complications: no prenatal complications   OB History    Grav Para Term Preterm Abortions TAB SAB Ect Mult Living   5 3 3  0 1 0 1 0 0 3     Past Medical History  Diagnosis Date  . History of carpal tunnel syndrome   . Seizures     Last seizure "years ago"   Past Surgical History  Procedure Date  . No past surgeries    Family History: family history includes Hypertension in her father. Social History:  reports that she has never smoked. She has never used smokeless tobacco. She reports that she does not drink alcohol or use illicit drugs.  Review of Systems  All other systems reviewed and are negative.    Dilation: 5.5 Effacement (%): 70 Station: -2;-3 Exam by:: ansah-mensah, rnc Blood pressure 158/74, pulse 116, temperature 98.2 F (36.8 C), temperature source Oral, resp. rate 18, height 5\' 1"  (1.549 m), weight 88.451 kg (195 lb), last menstrual period 12/11/2010, SpO2 100.00%. Maternal Exam:  Uterine Assessment: Contraction strength is firm.  Contraction frequency is regular.   Abdomen: Patient reports no abdominal tenderness. Fetal presentation: breech     Physical Exam  Nursing note and vitals reviewed. Constitutional: She is oriented to person, place, and time. She appears well-developed and well-nourished.  HENT:  Head: Normocephalic.  Eyes: Conjunctivae are normal. Pupils are equal, round, and reactive to light.  Neck: Normal range of motion. Neck supple.  Cardiovascular: Normal rate and regular rhythm.   Respiratory: Effort normal.  GI: Soft.  Musculoskeletal: Normal range of motion.  Neurological: She is alert and oriented to person, place, and time. She has normal reflexes.    Prenatal labs: ABO, Rh: O/Positive/-- (10/02 0000) Antibody: Negative  (10/02 0000) Rubella: Immune (10/02 0000) RPR: NON REACTIVE (05/06 2000)  HBsAg: Negative (10/02 0000)  HIV: Non-reactive (10/02 0000)  GBS: Negative (04/18 0000)   Assessment/Plan:  39 weeks.  Multiparity.   IOL                        Filippo Puls A 09/11/2011, 6:42 AM

## 2011-09-11 NOTE — Progress Notes (Signed)
Amanda Lozano is a 41 y.o. [id number] at [redacted]w[redacted]d by LMP admitted for induction of labor due to Elective at term.  Subjective:  Patient at 39 weeks .  Favorable cervix.  Wanted IOL.   Objective: BP 158/74  Pulse 116  Temp(Src) 98.2 F (36.8 C) (Oral)  Resp 18  Ht 5\' 1"  (1.549 m)  Wt 88.451 kg (195 lb)  BMI 36.84 kg/m2  SpO2 100%  LMP 12/11/2010      FHT:  FHR: 150 bpm, variability: moderate,  accelerations:  Present,  decelerations:  Absent UC:   regular, every 3 minutes SVE:   SROM.  Breech.  Labs: Lab Results  Component Value Date   WBC 7.3 09/10/2011   HGB 11.5* 09/10/2011   HCT 35.5* 09/10/2011   MCV 83.7 09/10/2011   PLT 237 09/10/2011    Assessment / Plan: 39 weeks.  Active labor.  Breech presentation.  Will proceed with C/S.  Amanda Lozano A 09/11/2011, 6:48 AM

## 2011-09-11 NOTE — Anesthesia Postprocedure Evaluation (Signed)
Anesthesia Post Note  Patient: Amanda Lozano  Procedure(s) Performed: Procedure(s) (LRB): CESAREAN SECTION (N/A)  Anesthesia type: Spinal  Patient location: PACU  Post pain: Pain level controlled  Post assessment: Post-op Vital signs reviewed  Last Vitals:  Filed Vitals:   09/11/11 0930  BP: 134/80  Pulse: 85  Temp:   Resp:     Post vital signs: Reviewed  Level of consciousness: awake  Complications: No apparent anesthesia complications

## 2011-09-11 NOTE — Transfer of Care (Signed)
Immediate Anesthesia Transfer of Care Note  Patient: Amanda Lozano  Procedure(s) Performed: Procedure(s) (LRB): CESAREAN SECTION (N/A)  Patient Location: PACU  Anesthesia Type: Spinal  Level of Consciousness: awake, alert  and oriented  Airway & Oxygen Therapy: Patient Spontanous Breathing  Post-op Assessment: Report given to PACU RN and Post -op Vital signs reviewed and stable  Post vital signs: Reviewed and stable  Complications: No apparent anesthesia complications

## 2011-09-11 NOTE — Anesthesia Preprocedure Evaluation (Signed)
Anesthesia Evaluation  Patient identified by MRN, date of birth, ID band Patient awake    Reviewed: Allergy & Precautions, H&P , NPO status , Patient's Chart, lab work & pertinent test results  Airway Mallampati: II TM Distance: >3 FB Neck ROM: full    Dental No notable dental hx.    Pulmonary neg pulmonary ROS,  breath sounds clear to auscultation  Pulmonary exam normal       Cardiovascular negative cardio ROS      Neuro/Psych negative psych ROS   GI/Hepatic negative GI ROS, Neg liver ROS,   Endo/Other  Morbid obesity  Renal/GU negative Renal ROS  negative genitourinary   Musculoskeletal negative musculoskeletal ROS (+)   Abdominal (+) + obese,   Peds negative pediatric ROS (+)  Hematology negative hematology ROS (+)   Anesthesia Other Findings   Reproductive/Obstetrics (+) Pregnancy                           Anesthesia Physical  Anesthesia Plan  ASA: III  Anesthesia Plan: Spinal   Post-op Pain Management:    Induction:   Airway Management Planned:   Additional Equipment:   Intra-op Plan:   Post-operative Plan:   Informed Consent: I have reviewed the patients History and Physical, chart, labs and discussed the procedure including the risks, benefits and alternatives for the proposed anesthesia with the patient or authorized representative who has indicated his/her understanding and acceptance.     Plan Discussed with: CRNA and Surgeon  Anesthesia Plan Comments:         Anesthesia Quick Evaluation

## 2011-09-11 NOTE — Progress Notes (Signed)
New Admit:  Pt rec'd from [facility] via bed for start of Magnesium Sulfate.  G5P4, 39.[redacted] wks gestation, female infant in CN.  Bottle feeding.  PCS, 5/7, 0703 - breech presentation.  Spinal anesthesia with Duramorph at [phone].  Inc BP this afternoon - orders for Magnesium Sulfate per Dr. Clearance Coots.  Pt. In bed, SR up x 2.  Call light at [facility].  Oriented to room.

## 2011-09-11 NOTE — Anesthesia Postprocedure Evaluation (Signed)
  Anesthesia Post Note  Patient: Amanda Lozano  Procedure(s) Performed: Procedure(s) (LRB): CESAREAN SECTION (N/A)  Anesthesia type: Spinal  Patient location: Mother/Baby  Post pain: Pain level controlled  Post assessment: Post-op Vital signs reviewed  Last Vitals:  Filed Vitals:   09/11/11 1500  BP: 145/83  Pulse: 92  Temp: 36.6 C  Resp: 18    Post vital signs: Reviewed  Level of consciousness: awake  Complications: No apparent anesthesia complications

## 2011-09-11 NOTE — Addendum Note (Signed)
Addendum  created 09/11/11 1535 by Algis Greenhouse, CRNA   Modules edited:Notes Section

## 2011-09-12 ENCOUNTER — Encounter (HOSPITAL_COMMUNITY): Payer: Self-pay | Admitting: Obstetrics

## 2011-09-12 LAB — CBC
HCT: 33.5 % — ABNORMAL LOW (ref 36.0–46.0)
Hemoglobin: 10.9 g/dL — ABNORMAL LOW (ref 12.0–15.0)
MCH: 27.3 pg (ref 26.0–34.0)
MCHC: 32.5 g/dL (ref 30.0–36.0)
MCV: 83.8 fL (ref 78.0–100.0)
Platelets: 203 10*3/uL (ref 150–400)
RBC: 4 MIL/uL (ref 3.87–5.11)
RDW: 14.5 % (ref 11.5–15.5)
WBC: 10 10*3/uL (ref 4.0–10.5)

## 2011-09-12 NOTE — Progress Notes (Signed)
Called Dr. Clearance Coots due to increased blood pressures.  Patient is not experiencing any other PIH symptoms at this time.  Order received to give 2 Percocet from Dr. Clearance Coots and to call back in one hour with blood pressure results.  Cox, Rhilynn Preyer M

## 2011-09-12 NOTE — Progress Notes (Signed)
Called Dr. Clearance Coots with continued high blood pressures.  Blood pressure after 2 Percocet was 167/95.  Order received to give Labetalol, transfer to AICU, and to start patient on Magnesium Sulfate.  Patient informed of transfer. Cox, Burnie Hank M

## 2011-09-12 NOTE — Progress Notes (Signed)
Subjective: Postpartum Day 1: Cesarean Delivery Patient reports tolerating PO, + flatus and no problems voiding.    Objective: Vital signs in last 24 hours: Temp:  [97.3 F (36.3 C)-99.4 F (37.4 C)] 98.3 F (36.8 C) (05/08 0800) Pulse Rate:  [84-123] 113  (05/08 0900) Resp:  [16-20] 18  (05/08 0800) BP: (122-167)/(78-95) 147/88 mmHg (05/08 0900) SpO2:  [98 %-100 %] 100 % (05/08 0900) Weight:  [83.961 kg (185 lb 1.6 oz)-85.957 kg (189 lb 8 oz)] 83.961 kg (185 lb 1.6 oz) (05/08 0600)  Physical Exam:  General: alert and no distress Lochia: appropriate Uterine Fundus: firm Incision: healing well DVT Evaluation: No evidence of DVT seen on physical exam.   Basename 09/12/11 0510 09/10/11 2000  HGB 10.9* 11.5*  HCT 33.5* 35.5*    Assessment/Plan: Status post Cesarean section. Doing well postoperatively.  Continue current care.  Estephanie Hubbs A 09/12/2011, 9:11 AM

## 2011-09-13 MED ORDER — AMLODIPINE BESYLATE 5 MG PO TABS
5.0000 mg | ORAL_TABLET | Freq: Every day | ORAL | Status: DC
  Administered 2011-09-13 – 2011-09-14 (×2): 5 mg via ORAL
  Filled 2011-09-13 (×3): qty 1

## 2011-09-13 MED ORDER — TRIAMTERENE-HCTZ 37.5-25 MG PO TABS
1.0000 | ORAL_TABLET | Freq: Every day | ORAL | Status: DC
  Administered 2011-09-13 – 2011-09-14 (×2): 1 via ORAL
  Filled 2011-09-13 (×3): qty 1

## 2011-09-13 NOTE — Progress Notes (Signed)
Subjective: Postpartum Day 2: Cesarean Delivery Patient reports tolerating PO, + flatus and no problems voiding.    Objective: Vital signs in last 24 hours: Temp:  [97.9 F (36.6 C)-98.5 F (36.9 C)] 98.1 F (36.7 C) (05/09 0400) Pulse Rate:  [77-113] 96  (05/09 0600) Resp:  [16-20] 20  (05/09 0600) BP: (125-147)/(80-93) 144/93 mmHg (05/09 0600) SpO2:  [98 %-100 %] 99 % (05/09 0600)  Physical Exam:  General: alert and no distress Lochia: appropriate Uterine Fundus: firm Incision: healing well DVT Evaluation: No evidence of DVT seen on physical exam.   Basename 09/12/11 0510 09/10/11 2000  HGB 10.9* 11.5*  HCT 33.5* 35.5*    Assessment/Plan: Status post Cesarean section. Doing well postoperatively.  Continue current care.  Damarrion Mimbs A 09/13/2011, 8:11 AM

## 2011-09-14 MED ORDER — TRIAMTERENE-HCTZ 37.5-25 MG PO CAPS: 1.0000 | ORAL_CAPSULE | ORAL | Status: DC

## 2011-09-14 MED ORDER — IBUPROFEN 600 MG PO TABS: 600.0000 mg | ORAL_TABLET | Freq: Four times a day (QID) | ORAL | Status: DC

## 2011-09-14 MED ORDER — CARVEDILOL 6.25 MG PO TABS: 6.2500 mg | ORAL_TABLET | Freq: Two times a day (BID) | ORAL | Status: DC

## 2011-09-14 MED ORDER — OXYCODONE-ACETAMINOPHEN 5-325 MG PO TABS: 1.0000 | ORAL_TABLET | ORAL | Status: AC | PRN

## 2011-09-14 NOTE — Discharge Summary (Signed)
Obstetric Discharge Summary Reason for Admission: induction of labor Prenatal Procedures: ultrasound Intrapartum Procedures: cesarean: low cervical, transverse Postpartum Procedures: magnesium sulfate Complications-Operative and Postpartum: hypertension Hemoglobin  Date Value Range Status  09/12/2011 10.9* 12.0-15.0 (g/dL) Final     HCT  Date Value Range Status  09/12/2011 33.5* 36.0-46.0 (%) Final    Physical Exam:  General: alert and no distress Lochia: appropriate Uterine Fundus: firm Incision: healing well DVT Evaluation: No evidence of DVT seen on physical exam.  Discharge Diagnoses: Term Pregnancy-delivered  LTCS for Breech Presentation. Discharge Information: Date: 09/14/2011 Activity: pelvic rest Diet: routine Medications: PNV, Ibuprofen and Percocet, Coreg, Dyazide. Condition: stable Instructions: refer to practice specific booklet Discharge to: home Follow-up Information    Follow up with Jiaire Rosebrook A, MD. Schedule an appointment as soon as possible for a visit in 2 weeks.   Contact information:   [address] [place] [address] [phone]          Newborn Data: Live born female  Birth Weight: 7 lb 10.2 oz (3465 g) APGAR: 9, 9  Home with mother.  Adrena Nakamura A 09/14/2011, 11:15 AM

## 2011-09-14 NOTE — Progress Notes (Signed)
Subjective: Postpartum Day 3: Cesarean Delivery Patient reports tolerating PO, + flatus, + BM and no problems voiding.    Objective: Vital signs in last 24 hours: Temp:  [97.7 F (36.5 C)-98.5 F (36.9 C)] 98.3 F (36.8 C) (05/10 1000) Pulse Rate:  [73-111] 105  (05/10 1000) Resp:  [16-20] 16  (05/10 1000) BP: (136-159)/(83-98) 136/90 mmHg (05/10 1000) SpO2:  [100 %] 100 % (05/10 1000)  Physical Exam:  General: alert and no distress Lochia: appropriate Uterine Fundus: firm Incision: healing well DVT Evaluation: No evidence of DVT seen on physical exam.   Basename 09/12/11 0510  HGB 10.9*  HCT 33.5*    Assessment/Plan: Status post Cesarean section. Doing well postoperatively.  Discharge home with standard precautions and return to clinic in 4-6 weeks.  Amanda Lozano A 09/14/2011, 11:02 AM

## 2011-12-10 ENCOUNTER — Other Ambulatory Visit: Payer: Self-pay | Admitting: Obstetrics & Gynecology

## 2011-12-19 ENCOUNTER — Encounter (HOSPITAL_COMMUNITY): Payer: Self-pay | Admitting: Pharmacist

## 2011-12-28 ENCOUNTER — Inpatient Hospital Stay (HOSPITAL_COMMUNITY): Admission: RE | Admit: 2011-12-28 | Payer: Medicaid Other | Source: Ambulatory Visit

## 2012-01-01 ENCOUNTER — Other Ambulatory Visit (HOSPITAL_COMMUNITY): Payer: Medicaid Other

## 2012-01-04 ENCOUNTER — Encounter (HOSPITAL_COMMUNITY): Admission: AD | Payer: Self-pay | Source: Ambulatory Visit

## 2012-01-04 ENCOUNTER — Ambulatory Visit (HOSPITAL_COMMUNITY)
Admission: AD | Admit: 2012-01-04 | Payer: Medicaid Other | Source: Ambulatory Visit | Admitting: Obstetrics & Gynecology

## 2012-01-04 SURGERY — ESSURE TUBAL STERILIZATION
Anesthesia: Choice | Laterality: Bilateral

## 2012-01-12 MED ORDER — KETOROLAC TROMETHAMINE 30 MG/ML IJ SOLN: 30.0000 mg | Freq: Once | INTRAMUSCULAR | Status: AC

## 2012-09-23 ENCOUNTER — Emergency Department (HOSPITAL_COMMUNITY)
Admission: EM | Admit: 2012-09-23 | Discharge: 2012-09-23 | Disposition: A | Discharge status: Home / Self Care | Payer: Medicaid Other | Attending: Emergency Medicine | Admitting: Emergency Medicine

## 2012-09-23 ENCOUNTER — Encounter (HOSPITAL_COMMUNITY): Payer: Self-pay | Admitting: Emergency Medicine

## 2012-09-23 DIAGNOSIS — R5381 Other malaise: Secondary | ICD-10-CM | POA: Insufficient documentation

## 2012-09-23 DIAGNOSIS — I10 Essential (primary) hypertension: Secondary | ICD-10-CM | POA: Insufficient documentation

## 2012-09-23 DIAGNOSIS — R569 Unspecified convulsions: Secondary | ICD-10-CM | POA: Insufficient documentation

## 2012-09-23 DIAGNOSIS — Z79899 Other long term (current) drug therapy: Secondary | ICD-10-CM | POA: Insufficient documentation

## 2012-09-23 DIAGNOSIS — Z8669 Personal history of other diseases of the nervous system and sense organs: Secondary | ICD-10-CM | POA: Insufficient documentation

## 2012-09-23 DIAGNOSIS — E7800 Pure hypercholesterolemia, unspecified: Secondary | ICD-10-CM | POA: Insufficient documentation

## 2012-09-23 HISTORY — DX: Pure hypercholesterolemia, unspecified: E78.00

## 2012-09-23 HISTORY — DX: Essential (primary) hypertension: I10

## 2012-09-23 LAB — BASIC METABOLIC PANEL
BUN: 13 mg/dL (ref 6–23)
CO2: 23 mEq/L (ref 19–32)
Chloride: 100 mEq/L (ref 96–112)
Creatinine, Ser: 0.7 mg/dL (ref 0.50–1.10)
Glucose, Bld: 149 mg/dL — ABNORMAL HIGH (ref 70–99)
Potassium: 4 mEq/L (ref 3.5–5.1)

## 2012-09-23 LAB — CBC
HCT: 38.6 % (ref 36.0–46.0)
Hemoglobin: 12.6 g/dL (ref 12.0–15.0)
MCH: 27.5 pg (ref 26.0–34.0)
MCV: 84.1 fL (ref 78.0–100.0)
Platelets: 240 10*3/uL (ref 150–400)
RBC: 4.59 MIL/uL (ref 3.87–5.11)
WBC: 9.1 10*3/uL (ref 4.0–10.5)

## 2012-09-23 LAB — GLUCOSE, CAPILLARY

## 2012-09-23 NOTE — ED Provider Notes (Signed)
History     CSN: [id number]  Arrival date & time 09/23/12  1335   First MD Initiated Contact with Patient 09/23/12 1547      Chief Complaint  Patient presents with  . Seizures    (Consider location/radiation/quality/duration/timing/severity/associated sxs/prior treatment) HPI Pt is a 42yo female w/ hx of seizures presenting today after a possible seizure that woke her from her sleep this morning.  Pt states she has not had a grand mal seizure in 7-8 years however, this morning when she woke up she felt sore and fatigued as she does after having a seizure.  Pt reports having not been taking medication, carbamazepine, consistently over the past 2 weeks, nor has she been obtaining adequate sleep due to hectic schedule and stresses at home.  Pt believes seizure was triggered due to lack of sleep but just wanted to be checked out. Denies falling out of bed.   Denies any symptoms at this time. Denies fever, chills, cough, head, neck, chest, back, or abd pain. Denies n/v/d.   Past Medical History  Diagnosis Date  . History of carpal tunnel syndrome   . Seizures     Last seizure "years ago"  . Hypertension   . High cholesterol     Past Surgical History  Procedure Laterality Date  . No past surgeries    . Cesarean section  09/11/2011    Procedure: CESAREAN SECTION;  Surgeon: Brock Bad, MD;  Location: [facility];  Service: Gynecology;  Laterality: N/A;    Family History  Problem Relation Age of Onset  . Hypertension Father     History  Substance Use Topics  . Smoking status: Never Smoker   . Smokeless tobacco: Never Used  . Alcohol Use: No    OB History   Grav Para Term Preterm Abortions TAB SAB Ect Mult Living   5 4 4  0 1 0 1 0 0 4      Review of Systems  Constitutional: Positive for fatigue. Negative for fever and chills.  Neurological: Positive for seizures.  All other systems reviewed and are negative.    Allergies  Review of patient's allergies indicates no  known allergies.  Home Medications   Current Outpatient Rx  Name  Route  Sig  Dispense  Refill  . atenolol (TENORMIN) 100 MG tablet   Oral   Take 100 mg by mouth daily.         . carbamazepine (TEGRETOL) 100 MG chewable tablet   Oral   Chew 100 mg by mouth 2 (two) times daily.          . norethindrone (NORA-BE) 0.35 MG tablet   Oral   Take 1 tablet by mouth daily.         . pravastatin (PRAVACHOL) 10 MG tablet   Oral   Take 10 mg by mouth daily.         Marland Kitchen EXPIRED: carvedilol (COREG) 6.25 MG tablet   Oral   Take 1 tablet (6.25 mg total) by mouth 2 (two) times daily with a meal.   60 tablet   0   . EXPIRED: triamterene-hydrochlorothiazide (DYAZIDE) 37.5-25 MG per capsule   Oral   Take 1 each (1 capsule total) by mouth every morning.   60 capsule   0     BP 142/93  Pulse 96  Temp(Src) 98.9 F (37.2 C) (Oral)  Resp 14  SpO2 100%  Physical Exam  Nursing note and vitals reviewed. Constitutional: She is oriented to  person, place, and time. She appears well-developed and well-nourished. No distress.  Pt lying in exam bed, nad  HENT:  Head: Normocephalic and atraumatic.  Eyes: Conjunctivae and EOM are normal. Pupils are equal, round, and reactive to light. Right eye exhibits no discharge. Left eye exhibits no discharge. No scleral icterus.  Neck: Normal range of motion. Neck supple.  Supple, no nuchal rigidity or meningeal signs  Cardiovascular: Normal rate, regular rhythm and normal heart sounds.   Pulmonary/Chest: Effort normal and breath sounds normal. No respiratory distress. She has no wheezes. She has no rales. She exhibits no tenderness.  Musculoskeletal: Normal range of motion.  Neurological: She is alert and oriented to person, place, and time. She has normal strength. No cranial nerve deficit or sensory deficit. She displays a negative Romberg sign. Coordination and gait normal. GCS eye subscore is 4. GCS verbal subscore is 5. GCS motor subscore is 6.   CN II-XII intact, no focal deficit. 5/5 strength, neg romberg, nl gait  Skin: Skin is warm and dry. She is not diaphoretic.  Psychiatric: She has a normal mood and affect. Her behavior is normal.    ED Course  Procedures (including critical care time)  Labs Reviewed  BASIC METABOLIC PANEL - Abnormal; Notable for the following:    Sodium 134 (*)    Glucose, Bld 149 (*)    All other components within normal limits  GLUCOSE, CAPILLARY - Abnormal; Notable for the following:    Glucose-Capillary 158 (*)    All other components within normal limits  CBC  CARBAMAZEPINE LEVEL, TOTAL   No results found.   1. Seizure       MDM  Pt w/ hx of or seizures experienced a possible seizure just prior to waking this morning.  Felt fatigued and sore similar to previous seizures 7-72yrs ago.  Denies falling out of bed.  Denies fever, chills, cough, head, neck, chest, back, or abd pain. Denies n/v/d. Pt believes possible seizure was due to lack of sleep and not taking her carbamazepine consistently due to busy schedule.  PE: pt appears well, NAD. Neuro exam: nl, CN II-XII in tact, no focal deficit.  GCS: 15. Neg romberg and nl gait.    Labs: unremarkable.  Do not believe further workup is needed at this time. Will have pt f/u with [facility] neurology since she does not have any neurologist in [place].  Moved 83yrs ago and just has PCP.  Will have pt f/u with PCP tomorrow.  Vitals: unremarkable. Discharged in stable condition.    Discussed pt with attending during ED encounter.            Junius Finner, PA-C 09/25/12 1704

## 2012-09-23 NOTE — ED Notes (Signed)
States that she thinks she had a sz in her sleep last night her balance is off and she aching all over states has not had one in 6 weeks  forgort x2 last week  Her meds

## 2012-09-23 NOTE — ED Notes (Addendum)
Patient states "last week I missed two doses". However she has been taken tegretol doses regularly since. No recent fevers or stimulus that patient can recall

## 2012-09-27 NOTE — ED Provider Notes (Signed)
Medical screening examination/treatment/procedure(s) were performed by non-physician practitioner and as supervising physician I was immediately available for consultation/collaboration.   Dione Booze, MD 09/27/12 [phone]

## 2012-11-10 ENCOUNTER — Encounter: Payer: Self-pay | Admitting: Neurology

## 2012-11-10 ENCOUNTER — Ambulatory Visit (INDEPENDENT_AMBULATORY_CARE_PROVIDER_SITE_OTHER): Payer: Medicaid Other | Admitting: Neurology

## 2012-11-10 VITALS — BP 136/94 | HR 84 | Temp 98.6°F | Resp 18 | Wt 176.0 lb

## 2012-11-10 DIAGNOSIS — R569 Unspecified convulsions: Secondary | ICD-10-CM

## 2012-11-10 NOTE — Progress Notes (Signed)
NEUROLOGY CONSULTATION NOTE  Amanda Lozano MRN: [id number] DOB: 08-21-1970   Reason for consult:  Seizure disorder  HISTORY OF PRESENT ILLNESS: Amanda Lozano is a 42 y.o. female with history of seizure disorder who presents at the request of the ER for evaluation of seizures. Her primary care provider is at [facility], however she does not know her PCP's name.  Patient has a history of seizure disorder, diagnosed in 6. Since childhood, she has experienced multiple brief "staring spells". It would only last a few seconds. He would tell her that she looked like she was daydreaming. In 1997, 3 months after having her second child, she had her first generalized tonic-clonic seizure. At first, it was thought to be a reaction to the Depo shot.  She denies any aura but will sometimes no fatigue. Usually they occurred at night time out of sleep. Often, she would wake up feeling fatigued. She has had some witnessed seizures and was noted to have generalized shaking with eyes rolled back. There would be a tongue laceration but no incontinence. She was initially started on Dilantin, which caused elevated liver enzymes. This was then changed to Topamax, which caused sensation of "drunkenness". Eventually, she was started on carbamazepine. She had been taking 100 mg in the morning and 200 mg in the evening for several years. She was initially followed for several years by a neurologist, Dr. Micheline Maze, in [place].  Reportedly, MRIs of the brain were unremarkable. She also said she had several EEGs which were unremarkable.  After she moved to [place], she never established with a neurologist since she has been seizure free for so long.  She was seizure free from generalized tonic-clonic seizures for about 7 years up until this past May. At that time, she woke up one morning and noted generalized fatigue as well as a tongue laceration. This was typical feeling after she would have a seizure  during sleep. She went to the emergency room on 09/23/12. A carbamazepine level was 4.9. Sodium was 134, BUN 13, and creatinine 0.70. She reported increased stress and poor sleep, and had missed some doses over the previous 2 weeks. Since that time, she has been compliant with her medications. However, about 2 weeks ago, she woke up with the same symptoms of headache and sleepiness, suggesting another seizure.  She thinks it may be secondary to stress, as this has been a trigger for seizures in the past. Specifically, she has been having family-related stress, such as one of her children going off to college, to very young children to care for her, and a 13 year old who recently had a baby.it should be noted that she still occasionally has "silent" seizures, namely the staring spells. She does not drive and has not [place] for many years due to her staring spells. She does have a family history of seizures on her father's side, namely her grandmother and her cousin. She had a normal birth. She has no history of meningitis or significant head trauma. She has not had any recent changes in medications.  PAST MEDICAL HISTORY: Past Medical History  Diagnosis Date  . History of carpal tunnel syndrome   . Seizures     Last seizure "years ago"  . Hypertension   . High cholesterol     PAST SURGICAL HISTORY: Past Surgical History  Procedure Laterality Date  . No past surgeries    . Cesarean section  09/11/2011    Procedure: CESAREAN SECTION;  Surgeon: Brock Bad, MD;  Location: [facility];  Service: Gynecology;  Laterality: N/A;    MEDICATIONS: Current Outpatient Prescriptions on File Prior to Visit  Medication Sig Dispense Refill  . atenolol (TENORMIN) 100 MG tablet Take 100 mg by mouth daily.      . carbamazepine (TEGRETOL) 100 MG chewable tablet Chew 100 mg by mouth. One qam and two q pm      . norethindrone (NORA-BE) 0.35 MG tablet Take 1 tablet by mouth daily.      . pravastatin (PRAVACHOL) 10 MG  tablet Take 10 mg by mouth daily.      . carvedilol (COREG) 6.25 MG tablet Take 1 tablet (6.25 mg total) by mouth 2 (two) times daily with a meal.  60 tablet  0  . triamterene-hydrochlorothiazide (DYAZIDE) 37.5-25 MG per capsule Take 1 each (1 capsule total) by mouth every morning.  60 capsule  0   No current facility-administered medications on file prior to visit.    ALLERGIES: No Known Allergies  FAMILY HISTORY: Family History  Problem Relation Age of Onset  . Hypertension Father     SOCIAL HISTORY: History   Social History  . Marital Status: Divorced    Spouse Name: N/A    Number of Children: N/A  . Years of Education: N/A   Occupational History  . Not on file.   Social History Main Topics  . Smoking status: Never Smoker   . Smokeless tobacco: Never Used  . Alcohol Use: No  . Drug Use: No  . Sexually Active: Yes   Other Topics Concern  . Not on file   Social History Narrative  . No narrative on file    REVIEW OF SYSTEMS: Constitutional: No fevers, chills, or sweats, no generalized fatigue, change in appetite Eyes: No visual changes, double vision, eye pain Ear, nose and throat: No hearing loss, ear pain, nasal congestion, sore throat Cardiovascular: No chest pain, palpitations Respiratory:  No shortness of breath at rest or with exertion, wheezes GastrointestinaI: No nausea, vomiting, diarrhea, abdominal pain, fecal incontinence Genitourinary:  No dysuria, urinary retention or frequency Musculoskeletal:  No neck pain, back pain Integumentary: No rash, pruritus, skin lesions Neurological: as above Psychiatric: Some depression, insomnia, anxiety Endocrine: No palpitations, fatigue, diaphoresis, mood swings, change in appetite, change in weight, increased thirst Hematologic/Lymphatic:  No anemia, purpura, petechiae. Allergic/Immunologic: no itchy/runny eyes, nasal congestion, recent allergic reactions, rashes  PHYSICAL EXAM: Filed Vitals:   11/10/12 1512   BP: 136/94  Pulse: 84  Temp: 98.6 F (37 C)  Resp: 18   General: No acute distress Head:  Normocephalic/atraumatic Neck: supple, no paraspinal tenderness, full range of motion Back: No paraspinal tenderness Heart: regular rate and rhythm Lungs: Clear to auscultation bilaterally. Neurological Exam: Mental status: alert and oriented to person, place, time and self, speech fluent and not dysarthric, language intact. Cranial nerves: CN I: not tested CN II: pupils equal, round and reactive to light, visual fields intact, fundi unremarkable CN III, IV, VI:  full range of motion, no nystagmus, no ptosis CN V: facial sensation intact CN VII: upper and lower face symmetric CN VIII: hearing intact CN IX, X: gag intact, uvula midline CN XI: sternocleidomastoid and trapezius muscles intact CN XII: tongue midline Bulk & Tone: normal, no fasciculations. Muscle strength: 5/5 throughout Sensation: pinprick and vibration intact Deep Tendon Reflexes: 2+ throughout, toes downgoing Finger to nose testing: normal without dysmetria Gait: normal, able to walk in tandem. Romberg negative.  IMPRESSION & PLAN: Amanda Lozano is a 42 y.o.  female with history of seizure disorder who presents to establish care given recent exacerbation of seizures. She had potentially 2 seizures during sleep over the past 3 months. 1.  Continue carbamazepine 100 mg every morning and 200 mg every afternoon for now. 2.  We will check a trough carbamazepine level and then make necessary adjustments. 3.  We will order a routine sleep deprived EEG. 4.  We will obtain pertinent records from her previous neurologist, Dr. Micheline Maze, in [place]. 5.  Patient does not drive so this is not an issue. 6.  Address depression and anxiety with PCP. 7.  followup in 3 months or as needed. Call with any questions or concerns  We will send consult over to [facility].  Thank you for allowing me to take part in the care  of this patient.  Shon Millet, DO

## 2012-11-10 NOTE — Patient Instructions (Addendum)
1.  Continue carbamazepine at 100mg  in the morning and 200mg  at night for now. 2.  We will check another carbamazepine level and make adjustments. 3.  We will get another EEG (sleep-deprived). 4.  We will contact Dr. Elio Forget office to get your medical records for review. 5.  Follow up in 3 months.  Call with any questions or concerns.  Your sleep deprived EEG is scheduled at [facility] on Thursday, July 24th at 8:00 am.  Please arrive at admitting fifteen minutes prior to your appointment. Enter the hospital off of [address] at [facility].    [phone].  Blood work in our office at your earliest convenience. It is a trough level so bring your medication with you so you can take it AFTER the blood is drawn.

## 2012-11-14 ENCOUNTER — Other Ambulatory Visit: Payer: Medicaid Other

## 2012-11-14 DIAGNOSIS — R569 Unspecified convulsions: Secondary | ICD-10-CM

## 2012-11-15 LAB — CARBAMAZEPINE LEVEL, TOTAL: Carbamazepine Lvl: 6.6 ug/mL (ref 4.0–12.0)

## 2012-11-17 ENCOUNTER — Telehealth: Payer: Self-pay | Admitting: Neurology

## 2012-11-17 DIAGNOSIS — R569 Unspecified convulsions: Secondary | ICD-10-CM

## 2012-11-17 MED ORDER — CARBAMAZEPINE 100 MG PO CHEW: CHEWABLE_TABLET | ORAL | Status: DC

## 2012-11-17 NOTE — Telephone Encounter (Signed)
Spoke with the patient. Information given as per Dr. Everlena Cooper below. The patient will come for lab work in one week. Trough level explained to the patient. Reorder for the Carbamezipine will be sent to her pharmacy ([facility] on [address]).

## 2012-11-17 NOTE — Telephone Encounter (Signed)
Message copied by Benay Spice on Mon Nov 17, 2012 11:37 AM ------      Message from: JAFFE, ADAM R      Created: Mon Nov 17, 2012  7:16 AM       Please have Ms. Johnson increase her carbamazepine to 200mg  BID and recheck trough level in one week.  Thank you. ------

## 2012-11-27 ENCOUNTER — Other Ambulatory Visit (HOSPITAL_COMMUNITY): Payer: Medicaid Other

## 2013-02-10 ENCOUNTER — Ambulatory Visit: Payer: Medicaid Other | Admitting: Neurology

## 2013-03-06 ENCOUNTER — Ambulatory Visit: Payer: Medicaid Other | Admitting: Neurology

## 2013-04-27 ENCOUNTER — Ambulatory Visit (INDEPENDENT_AMBULATORY_CARE_PROVIDER_SITE_OTHER): Payer: Medicaid Other | Admitting: Obstetrics

## 2013-04-27 ENCOUNTER — Encounter: Payer: Self-pay | Admitting: Obstetrics

## 2013-04-27 VITALS — BP 166/100 | HR 99 | Temp 98.7°F | Ht 61.0 in | Wt 187.0 lb

## 2013-04-27 DIAGNOSIS — Z309 Encounter for contraceptive management, unspecified: Secondary | ICD-10-CM

## 2013-04-27 DIAGNOSIS — IMO0001 Reserved for inherently not codable concepts without codable children: Secondary | ICD-10-CM

## 2013-04-27 DIAGNOSIS — Z3202 Encounter for pregnancy test, result negative: Secondary | ICD-10-CM

## 2013-04-27 LAB — POCT URINE PREGNANCY: Preg Test, Ur: NEGATIVE

## 2013-04-27 MED ORDER — NORETHINDRONE 0.35 MG PO TABS: 1.0000 | ORAL_TABLET | Freq: Every day | ORAL | Status: DC

## 2013-04-27 NOTE — Progress Notes (Signed)
Pt in office today for birth control consult, would like information on Esure and tubal ligation.  Reports she needs prescription for oral birth control.  PE:  Omitted  A/P:  Consultation for contraception.  Desires Essure procedure.Marland Kitchen  Appointment scheduled with Dr. Tina Griffiths.

## 2013-04-28 ENCOUNTER — Encounter: Payer: Self-pay | Admitting: Neurology

## 2013-04-28 ENCOUNTER — Ambulatory Visit (INDEPENDENT_AMBULATORY_CARE_PROVIDER_SITE_OTHER): Payer: Medicaid Other | Admitting: Neurology

## 2013-04-28 ENCOUNTER — Ambulatory Visit: Payer: Medicaid Other | Admitting: Neurology

## 2013-04-28 VITALS — BP 140/80 | HR 72 | Temp 98.3°F | Ht 61.0 in | Wt 185.0 lb

## 2013-04-28 DIAGNOSIS — R569 Unspecified convulsions: Secondary | ICD-10-CM

## 2013-04-28 MED ORDER — LAMOTRIGINE 100 MG PO TABS: ORAL_TABLET | ORAL | Status: DC

## 2013-04-28 NOTE — Progress Notes (Signed)
NEUROLOGY FOLLOW UP OFFICE NOTE  Amanda Lozano [id number]  HISTORY OF PRESENT ILLNESS: Amanda Lozano is a 42 year old woman who presents for follow up regarding seizure disorder.   Patient has a history of seizure disorder, diagnosed in 45.  However, she has experienced multiple brief "staring spells" since childhood, looking like she was daydreaming. It would only last a few seconds. In 1997, 3 months after having her second child, she had her first generalized tonic-clonic seizure. At first, it was thought to be a reaction to the Depo shot.  She denies any aura but will sometimes no fatigue. Usually they occurred at night time out of sleep. Often, she would wake up feeling fatigued. She has had some witnessed seizures and was noted to have generalized shaking with eyes rolled back. There would be a tongue laceration but no incontinence.  She was initially followed for several years by a neurologist, Dr. Micheline Maze, in [place].  Reportedly, MRIs of the brain were unremarkable. She also said she had several EEGs which were unremarkable.    She had been on carbamazepine 100mg  in morning and 200mg  in evening.  She was seizure free from generalized tonic-clonic seizures for about 7 years up until May 2014. At that time, she woke up one morning and noted generalized fatigue as well as a tongue laceration. This was typical feeling after she would have a seizure during sleep. She went to the emergency room on 09/23/12. A carbamazepine level was 4.9. Sodium was 134, BUN 13, and creatinine 0.70. She reported increased stress and poor sleep, and had missed some doses over the previous 2 weeks. Since that time, she has been compliant with her medications.  Afterwards, she woke up with the same symptoms of headache and sleepiness, suggesting another seizure.  She thinks it may be secondary to stress, as this has been a trigger for seizures in the past. Specifically, she has been having  family-related stress, such as one of her children going off to college, to very young children to care for her, and a 1 year old who recently had a baby.it should be noted that she still occasionally has "silent" seizures, namely the staring spells. She does not drive.   When I saw her in July, her carbamazepine level was 6.6.  I recommended increasing the dose to 200mg  BID in order to achieve optimal seizure control.  However, she experienced increased lethargy so she reduced it back to 100mg  in morning and 200mg  at night.  She has not had any further convulsive seizures but still has staring spells.   She does have a family history of seizures on her father's side, namely her grandmother and her cousin. She had a normal birth. She has no history of meningitis or significant head trauma.   She has been experiencing fairly significant stress and anxiety.  She gets flustered and upset easily when she has to multi-task, such as yesterday when she had to run several errands.  Today, she accidentally put in the wrong address to the office, so she was late.  This caused a great deal of anxiety and she began to cry.  She has been proactive about improving this.  She started attending zumba classes and plans to start walking as well.  She has been trying to modify her diet too.  Past AEDs:  Dilantin (elevated LFTs), Topamax (fogginess)  PAST MEDICAL HISTORY: Past Medical History  Diagnosis Date  . History of carpal tunnel syndrome   . Seizures  Last seizure "years ago"  . Hypertension   . High cholesterol     MEDICATIONS: Current Outpatient Prescriptions on File Prior to Visit  Medication Sig Dispense Refill  . atenolol (TENORMIN) 100 MG tablet Take 100 mg by mouth daily.      . carbamazepine (TEGRETOL) 100 MG chewable tablet two qam and two q pm.  120 tablet  1  . norethindrone (NORA-BE) 0.35 MG tablet Take 1 tablet (0.35 mg total) by mouth daily.  1 Package  3  . carvedilol (COREG) 6.25 MG  tablet Take 1 tablet (6.25 mg total) by mouth 2 (two) times daily with a meal.  60 tablet  0   No current facility-administered medications on file prior to visit.    ALLERGIES: No Known Allergies  FAMILY HISTORY: Family History  Problem Relation Age of Onset  . Hypertension Father   . Seizures Paternal Grandmother     SOCIAL HISTORY: History   Social History  . Marital Status: Divorced    Spouse Name: N/A    Number of Children: N/A  . Years of Education: N/A   Occupational History  . Not on file.   Social History Main Topics  . Smoking status: Never Smoker   . Smokeless tobacco: Never Used  . Alcohol Use: No  . Drug Use: No  . Sexual Activity: Yes    Birth Control/ Protection: Abstinence   Other Topics Concern  . Not on file   Social History Narrative  . No narrative on file    REVIEW OF SYSTEMS: Constitutional: No fevers, chills, or sweats, no generalized fatigue, change in appetite Eyes: No visual changes, double vision, eye pain Ear, nose and throat: No hearing loss, ear pain, nasal congestion, sore throat Cardiovascular: No chest pain, palpitations Respiratory:  No shortness of breath at rest or with exertion, wheezes GastrointestinaI: No nausea, vomiting, diarrhea, abdominal pain, fecal incontinence Genitourinary:  No dysuria, urinary retention or frequency Musculoskeletal:  No neck pain, back pain Integumentary: No rash, pruritus, skin lesions Neurological: as above Psychiatric: No depression, insomnia, anxiety Endocrine: No palpitations, fatigue, diaphoresis, mood swings, change in appetite, change in weight, increased thirst Hematologic/Lymphatic:  No anemia, purpura, petechiae. Allergic/Immunologic: no itchy/runny eyes, nasal congestion, recent allergic reactions, rashes  PHYSICAL EXAM: Filed Vitals:   04/28/13 1042  BP: 140/80  Pulse: 72  Temp: 98.3 F (36.8 C)   General: No acute distress Head:  Normocephalic/atraumatic Neck: supple, no  paraspinal tenderness, full range of motion Heart:  Regular rate and rhythm Lungs:  Clear to auscultation bilaterally Back: No paraspinal tenderness Neurological Exam: alert and oriented to person, place, and time. Speech fluent and not dysarthric, language intact.  CN II-XII intact. Fundoscopic exam unremarkable, no papilledema.  Bulk and tone normal, muscle strength 5/5 throughout.  Sensation to light touch intact.  Deep tendon reflexes 2+ throughout.  Finger to nose testing intact.  Gait normal, Romberg negative.  IMPRESSION: 1.  Seizure disorder 2.  Anxiety  PLAN: 1.  We will continue Tegretol at current dose for now.  But we will start Lamictal and titrate to goal of 100mg  BID.  We will check a level and further titrate if needed.  This may help address mood as well. 2.  Discuss with PCP regarding anxiety and stress management.  Shon Millet, DO  CC:  [name], MD

## 2013-04-28 NOTE — Patient Instructions (Addendum)
1.  Continue Tegretol at current dose. 2.  Lamotrigine 100mg  tablets  Week 1 and 2:  Take 1/2 tab daily for 14 days  Week 3 and 4:  Then 1/2 tab twice daily for 14 days  Week 5:  Then 1 tablet twice daily   At the end of week 5, call the clinic so we can check a lamictal level.  If you experience any new or unusual rash while taking this medication, call the clinic immediately.  There is also a small risk of suicidal ideation, which is the case for all antiepileptic medications.  Pregnancy Guidelines 1. If any medication changes are to be made, they should be completed several months before conception. Lamictal (lamotrigine) and Trileptal (oxcarbazepine) levels, in particular, need to carefully monitored (at least once a month) during pregnancy as drug levels for these two medications tend to decrease by at least 30% during pregnancy. 2. The risk of fetal malformation is increased in women taking seizure medications: approximately 2% with Trileptal, Keppra or Lamictal, 3% with Tegretol or Dilantin, 7-9% with Depakote, 15% with two antiseizure drugs, compared to 1-2% in the general population. 3. Women with epilepsy planning a pregnancy should take 5 mg/day of folic acid in the preconception period and throughout pregnancy. 4. Vitamin K (20 mg/day) should be used in the last month of pregnancy in women on enzyme-inducing seizure medications (Dilantin, Phenobarbital, Tegretol, Lamictal, Topamax, Trileptal).  Infants should receive 1 mg of vitamin K intramuscularly at birth, to prevent hemorrhagic disease of the newborn. 5. Overbreathing (hyperventilation), sleep deprivation, pain, and emotional stress increase the risk of seizures during labor.  Consider epidural anesthesia early in the labor. 6. All currently available seizure medications can be taken while breast feeding.  If drowsiness occurs in breastfed babies whose mothers are taking Phenobarbital, alternate breast and bottle feedings.  3.   Discuss with your PCP regarding treatment for stress and anxiety.

## 2013-05-20 ENCOUNTER — Other Ambulatory Visit: Payer: Self-pay | Admitting: Neurology

## 2013-05-20 ENCOUNTER — Telehealth: Payer: Self-pay | Admitting: Neurology

## 2013-05-20 DIAGNOSIS — R569 Unspecified convulsions: Secondary | ICD-10-CM

## 2013-05-20 NOTE — Telephone Encounter (Signed)
spoek with Amanda Lozano. Will come to the office at the end of January to have Lamictal level drawn. Understands trough. Is tolerating the medication well.

## 2013-05-20 NOTE — Telephone Encounter (Signed)
Message copied by Benay SpiceSTONEMAN, Saya Mccoll H on Wed May 20, 2013 10:50 AM ------      Message from: Benay SpiceSTONEMAN, Tahiri Shareef H      Created: Tue Apr 28, 2013 11:37 AM      Regarding: Lamictal level       Needs Lamictal level around the end of January. Will need to place the order. ------

## 2013-05-21 ENCOUNTER — Institutional Professional Consult (permissible substitution): Payer: Medicaid Other | Admitting: Obstetrics & Gynecology

## 2013-06-12 ENCOUNTER — Telehealth: Payer: Self-pay | Admitting: *Deleted

## 2013-06-12 ENCOUNTER — Telehealth: Payer: Self-pay | Admitting: Neurology

## 2013-06-12 NOTE — Telephone Encounter (Signed)
Pt would like to know if she should have blood work drawn d/t sz meds and if she needs to continue sz meds. She says she presented in our office 2 weeks ago but for reasons that I could not really make sense of she did not have her labs drawn. Please call [phone] / Roanna RaiderSherri

## 2013-06-12 NOTE — Telephone Encounter (Signed)
Please see note patient is asking if she should have labs done since she is on  Seizure  medication and do you want her to continue medication

## 2013-06-12 NOTE — Telephone Encounter (Signed)
Patient states she came to office on the 06/04/13 to have labs drawn but she was told she had to have a Caid number  So she was not able to get labs drawn. She also so states she is taking 50mg   Lamictal am and 50 mg HS and has did not want to increase as it makes her not able to function . Please let me know if you want me to set up labs for her and I will do so and call her Monday

## 2013-06-12 NOTE — Telephone Encounter (Signed)
Patient is asking do you want to do lab work on her since she is on seizure medication  ? And do you want her to stay on the medication  ? Please advise

## 2013-06-12 NOTE — Telephone Encounter (Signed)
She's supposed to continue the Tegretol but we started a Lamictal titration last visit.  Please refer to patient instructions from last visit.  As per patient instructions, she is to titrate up on Lamictal as instructed.  At the end of week 5, we are to check a trough lamictal level and I would like to check a BMP at that time as well.  She should be on Lamictal 100mg  BID by now, so we should check the trough level and a BMP.  Further instructions pending results.

## 2013-06-15 NOTE — Telephone Encounter (Signed)
So it sounds like she doesn't want to continue the Lamictal?  How was she not able to function.  We can start another medication instead.  I would recommend Vimpat 50mg  twice daily.  To taper off of Lamictal, she can take 50mg  once for three days and then stop.

## 2013-07-28 ENCOUNTER — Ambulatory Visit: Payer: Medicaid Other | Admitting: Neurology

## 2013-08-04 ENCOUNTER — Encounter: Payer: Self-pay | Admitting: Neurology

## 2013-08-04 ENCOUNTER — Other Ambulatory Visit: Payer: Self-pay | Admitting: *Deleted

## 2013-08-04 ENCOUNTER — Telehealth: Payer: Self-pay | Admitting: *Deleted

## 2013-08-04 ENCOUNTER — Ambulatory Visit (INDEPENDENT_AMBULATORY_CARE_PROVIDER_SITE_OTHER): Payer: Medicaid Other | Admitting: Neurology

## 2013-08-04 VITALS — BP 122/78 | HR 80 | Resp 16 | Ht 61.0 in | Wt 183.0 lb

## 2013-08-04 DIAGNOSIS — F329 Major depressive disorder, single episode, unspecified: Secondary | ICD-10-CM

## 2013-08-04 DIAGNOSIS — Z7189 Other specified counseling: Secondary | ICD-10-CM

## 2013-08-04 DIAGNOSIS — F411 Generalized anxiety disorder: Secondary | ICD-10-CM

## 2013-08-04 DIAGNOSIS — F32A Depression, unspecified: Secondary | ICD-10-CM

## 2013-08-04 DIAGNOSIS — IMO0001 Reserved for inherently not codable concepts without codable children: Secondary | ICD-10-CM

## 2013-08-04 DIAGNOSIS — R569 Unspecified convulsions: Secondary | ICD-10-CM

## 2013-08-04 DIAGNOSIS — F3289 Other specified depressive episodes: Secondary | ICD-10-CM

## 2013-08-04 LAB — COMPREHENSIVE METABOLIC PANEL
ALT: 17 U/L (ref 0–35)
AST: 23 U/L (ref 0–37)
Albumin: 3.7 g/dL (ref 3.5–5.2)
Alkaline Phosphatase: 135 U/L — ABNORMAL HIGH (ref 39–117)
BILIRUBIN TOTAL: 0.2 mg/dL (ref 0.2–1.2)
BUN: 9 mg/dL (ref 6–23)
CALCIUM: 8.7 mg/dL (ref 8.4–10.5)
CHLORIDE: 100 meq/L (ref 96–112)
CO2: 27 mEq/L (ref 19–32)
CREATININE: 0.7 mg/dL (ref 0.50–1.10)
Glucose, Bld: 85 mg/dL (ref 70–99)
Potassium: 3.7 mEq/L (ref 3.5–5.3)
Sodium: 135 mEq/L (ref 135–145)
Total Protein: 7.6 g/dL (ref 6.0–8.3)

## 2013-08-04 LAB — CBC WITH DIFFERENTIAL/PLATELET
BASOS ABS: 0 10*3/uL (ref 0.0–0.1)
Basophils Relative: 0 % (ref 0–1)
EOS ABS: 0 10*3/uL (ref 0.0–0.7)
EOS PCT: 1 % (ref 0–5)
HEMATOCRIT: 36 % (ref 36.0–46.0)
Hemoglobin: 11.9 g/dL — ABNORMAL LOW (ref 12.0–15.0)
LYMPHS PCT: 44 % (ref 12–46)
Lymphs Abs: 1.9 10*3/uL (ref 0.7–4.0)
MCH: 27.7 pg (ref 26.0–34.0)
MCHC: 33.1 g/dL (ref 30.0–36.0)
MCV: 83.7 fL (ref 78.0–100.0)
MONO ABS: 0.5 10*3/uL (ref 0.1–1.0)
Monocytes Relative: 11 % (ref 3–12)
Neutro Abs: 1.9 10*3/uL (ref 1.7–7.7)
Neutrophils Relative %: 44 % (ref 43–77)
Platelets: 264 10*3/uL (ref 150–400)
RBC: 4.3 MIL/uL (ref 3.87–5.11)
RDW: 14 % (ref 11.5–15.5)
WBC: 4.4 10*3/uL (ref 4.0–10.5)

## 2013-08-04 NOTE — Telephone Encounter (Signed)
Spoke with patient she will [facility]  At [phone] and make an appt . [facility]

## 2013-08-04 NOTE — Progress Notes (Signed)
NEUROLOGY FOLLOW UP OFFICE NOTE  Rosena Bartle [id number]  HISTORY OF PRESENT ILLNESS: Amanda Lozano is a 43 year old woman who presents for follow up regarding seizure disorder.   UPDATE: Due to persistent staring spells, she was started on Lamictal in addition to Tegretol.  She reported difficulty functioning so the Lamictal was tapered off.  It was intended to start Vimpat instead, but this was never done.  She continues to have staring spells, maybe 10 a day.  She began improving her lifestyle.  She started exercising, meditating and eating better.  She has increased her water intake. She has not had any tonic clonic seizures.  She continues to have depression and anxiety, particularly about her brother who is dealing with a drug problem.  She is unable to work due to her anxiety and depression.  She would like to talk with a therapist.  HISTORY: Patient has a history of seizure disorder, diagnosed in 67.  However, she has experienced multiple brief "staring spells" since childhood, looking like she was daydreaming. It would only last a few seconds. In 1997, 3 months after having her second child, she had her first generalized tonic-clonic seizure. At first, it was thought to be a reaction to the Depo shot.  She denies any aura but will sometimes no fatigue. Usually they occurred at night time out of sleep. Often, she would wake up feeling fatigued. She has had some witnessed seizures and was noted to have generalized shaking with eyes rolled back. There would be a tongue laceration but no incontinence.  She was initially followed for several years by a neurologist, Dr. Micheline Maze, in [place].  Reportedly, MRIs of the brain were unremarkable. She also said she had several EEGs which were unremarkable.     She had been on carbamazepine 100mg  in morning and 200mg  in evening.  Higher doses caused increased lethargy.  She was seizure free from generalized tonic-clonic seizures for  about 7 years up until May 2014. At that time, she woke up one morning and noted generalized fatigue as well as a tongue laceration. This was typical feeling after she would have a seizure during sleep. She went to the emergency room on 09/23/12. A carbamazepine level was 4.9. Sodium was 134, BUN 13, and creatinine 0.70. She reported increased stress and poor sleep, and had missed some doses over the previous 2 weeks. Since that time, she has been compliant with her medications.  Afterwards, she woke up with the same symptoms of headache and sleepiness, suggesting another seizure.  She thinks it may be secondary to stress, as this has been a trigger for seizures in the past. Specifically, she has been having family-related stress, such as one of her children going off to college, to very young children to care for her, and a 47 year old who recently had a baby.it should be noted that she still occasionally has "silent" seizures, namely the staring spells. She does not drive. Her last generalized tonic clonic seizure was in late August or early September.  She does have a family history of seizures on her father's side, namely her grandmother and her cousin. She had a normal birth. She has no history of meningitis or significant head trauma.   She has been experiencing fairly significant stress and anxiety.  She gets flustered and upset easily when she has to multi-task, such as yesterday when she had to run several errands.  Today, she accidentally put in the wrong address to the office, so  she was late. This caused a great deal of anxiety and she began to cry.  She has been proactive about improving this.  She started attending zumba classes and plans to start walking as well.  She has been trying to modify her diet too.  Past AEDs:  Dilantin (elevated LFTs), Topamax (fogginess)  PAST MEDICAL HISTORY: Past Medical History  Diagnosis Date  . History of carpal tunnel syndrome   . Seizures     Last seizure  "years ago"  . Hypertension   . High cholesterol     MEDICATIONS: Current Outpatient Prescriptions on File Prior to Visit  Medication Sig Dispense Refill  . atenolol (TENORMIN) 100 MG tablet Take 100 mg by mouth daily.      . carbamazepine (TEGRETOL) 100 MG chewable tablet two qam and two q pm.  120 tablet  1  . norethindrone (NORA-BE) 0.35 MG tablet Take 1 tablet (0.35 mg total) by mouth daily.  1 Package  3   No current facility-administered medications on file prior to visit.    ALLERGIES: No Known Allergies  FAMILY HISTORY: Family History  Problem Relation Age of Onset  . Hypertension Father   . Seizures Paternal Grandmother     SOCIAL HISTORY: History   Social History  . Marital Status: Divorced    Spouse Name: N/A    Number of Children: N/A  . Years of Education: N/A   Occupational History  . Not on file.   Social History Main Topics  . Smoking status: Never Smoker   . Smokeless tobacco: Never Used  . Alcohol Use: No  . Drug Use: No  . Sexual Activity: Yes    Birth Control/ Protection: Abstinence   Other Topics Concern  . Not on file   Social History Narrative  . No narrative on file    REVIEW OF SYSTEMS: Constitutional: No fevers, chills, or sweats, no generalized fatigue, change in appetite Eyes: No visual changes, double vision, eye pain Ear, nose and throat: No hearing loss, ear pain, nasal congestion, sore throat Cardiovascular: No chest pain, palpitations Respiratory:  No shortness of breath at rest or with exertion, wheezes GastrointestinaI: No nausea, vomiting, diarrhea, abdominal pain, fecal incontinence Genitourinary:  No dysuria, urinary retention or frequency Musculoskeletal:  No neck pain, back pain Integumentary: No rash, pruritus, skin lesions Neurological: as above Psychiatric: No depression, insomnia, anxiety Endocrine: No palpitations, fatigue, diaphoresis, mood swings, change in appetite, change in weight, increased  thirst Hematologic/Lymphatic:  No anemia, purpura, petechiae. Allergic/Immunologic: no itchy/runny eyes, nasal congestion, recent allergic reactions, rashes  PHYSICAL EXAM: Filed Vitals:   08/04/13 1258  BP: 122/78  Pulse: 80  Resp: 16   General: No acute distress Head:  Normocephalic/atraumatic Neck: supple, no paraspinal tenderness, full range of motion Heart:  Regular rate and rhythm Lungs:  Clear to auscultation bilaterally Back: No paraspinal tenderness Neurological Exam: alert and oriented to person, place, and time. Attention span and concentration intact, recent and remote memory intact, fund of knowledge intact.  Speech fluent and not dysarthric, language intact.  CN II-XII intact. Fundoscopic exam unremarkable without vessel changes, exudates, hemorrhages or papilledema.  Bulk and tone normal, muscle strength 5/5 throughout.  Sensation to light touch, temperature and vibration intact.  Deep tendon reflexes 2+ throughout, toes downgoing.  Finger to nose and heel to shin testing intact.  Gait normal, Romberg negative.  IMPRESSION: 1.  Seizure disorder, stable. 2.  Staring spells. 3.  Depression and anxiety  PLAN: 1.  Continue Tegretol  100mg  in AM and 200mg  at bedtime. 2.  Check CBC and CMP 3.  Will order 24 hour ambulatory EEG to capture and evaluate her staring spells. 4.  Refer to Behavioral Health 5.  Follow up soon after EEG to discuss results.  30 minutes spent with patient, over 50% spent counseling, discussing her depression, and coordinating care.  Shon Millet, DO  CC:  [name], MD

## 2013-08-04 NOTE — Patient Instructions (Addendum)
1.  We will continue the Tegretol (carbamazepine) 100mg  in the morning and 200mg  at bedtime. 2.  We will check some blood work. Your provider has requested that you have labwork completed today. [facility] on the first floor of this building before leaving the office today. 3.  We will arrange for a 24 hour ambulatory EEG to look at the staring spells. [facility]/20/2013 at 1:00 pm. Please arrive 15 minutes prior and go to Admissions.  4.  Follow up right after the EEG to discuss next steps. 5. We will refer you to behavioral health for counseling.

## 2013-08-05 ENCOUNTER — Telehealth: Payer: Self-pay | Admitting: *Deleted

## 2013-08-05 NOTE — Telephone Encounter (Signed)
Patient is aware that labs are ok

## 2013-08-06 ENCOUNTER — Telehealth: Payer: Self-pay | Admitting: Neurology

## 2013-08-06 ENCOUNTER — Telehealth: Payer: Self-pay | Admitting: *Deleted

## 2013-08-06 NOTE — Telephone Encounter (Signed)
Pt calling to speak w/ Susie. CB# [phone] / Sherri S.

## 2013-08-06 NOTE — Telephone Encounter (Signed)
Patient is aware she has appt today with behavorial health at 3pm in [place]

## 2013-08-12 ENCOUNTER — Other Ambulatory Visit: Payer: Self-pay | Admitting: *Deleted

## 2013-08-12 DIAGNOSIS — IMO0001 Reserved for inherently not codable concepts without codable children: Secondary | ICD-10-CM

## 2013-08-12 MED ORDER — NORETHINDRONE 0.35 MG PO TABS: 1.0000 | ORAL_TABLET | Freq: Every day | ORAL | Status: DC

## 2013-08-13 ENCOUNTER — Encounter: Payer: Self-pay | Admitting: Obstetrics

## 2013-08-20 ENCOUNTER — Inpatient Hospital Stay (HOSPITAL_COMMUNITY): Admission: RE | Admit: 2013-08-20 | Payer: Medicaid Other | Source: Ambulatory Visit

## 2013-09-03 ENCOUNTER — Ambulatory Visit: Payer: Medicaid Other | Admitting: Neurology

## 2013-10-05 ENCOUNTER — Ambulatory Visit: Payer: Medicaid Other | Admitting: Neurology

## 2013-10-19 ENCOUNTER — Encounter: Payer: Self-pay | Admitting: Neurology

## 2013-10-19 ENCOUNTER — Ambulatory Visit (INDEPENDENT_AMBULATORY_CARE_PROVIDER_SITE_OTHER): Payer: Medicaid Other | Admitting: Neurology

## 2013-10-19 VITALS — BP 122/74 | HR 70 | Temp 97.8°F | Resp 16 | Ht 61.0 in | Wt 182.2 lb

## 2013-10-19 DIAGNOSIS — R569 Unspecified convulsions: Secondary | ICD-10-CM

## 2013-10-19 DIAGNOSIS — IMO0001 Reserved for inherently not codable concepts without codable children: Secondary | ICD-10-CM

## 2013-10-19 LAB — CBC WITH DIFFERENTIAL/PLATELET
BASOS ABS: 0 10*3/uL (ref 0.0–0.1)
Basophils Relative: 0 % (ref 0–1)
EOS PCT: 2 % (ref 0–5)
Eosinophils Absolute: 0.1 10*3/uL (ref 0.0–0.7)
HCT: 36 % (ref 36.0–46.0)
Hemoglobin: 11.9 g/dL — ABNORMAL LOW (ref 12.0–15.0)
LYMPHS PCT: 47 % — AB (ref 12–46)
Lymphs Abs: 2.3 10*3/uL (ref 0.7–4.0)
MCH: 27.5 pg (ref 26.0–34.0)
MCHC: 33.1 g/dL (ref 30.0–36.0)
MCV: 83.1 fL (ref 78.0–100.0)
Monocytes Absolute: 0.6 10*3/uL (ref 0.1–1.0)
Monocytes Relative: 13 % — ABNORMAL HIGH (ref 3–12)
Neutro Abs: 1.9 10*3/uL (ref 1.7–7.7)
Neutrophils Relative %: 38 % — ABNORMAL LOW (ref 43–77)
PLATELETS: 274 10*3/uL (ref 150–400)
RBC: 4.33 MIL/uL (ref 3.87–5.11)
RDW: 14 % (ref 11.5–15.5)
WBC: 4.9 10*3/uL (ref 4.0–10.5)

## 2013-10-19 LAB — COMPLETE METABOLIC PANEL WITH GFR
ALT: 19 U/L (ref 0–35)
AST: 27 U/L (ref 0–37)
Albumin: 4 g/dL (ref 3.5–5.2)
Alkaline Phosphatase: 152 U/L — ABNORMAL HIGH (ref 39–117)
BILIRUBIN TOTAL: 0.2 mg/dL (ref 0.2–1.2)
BUN: 13 mg/dL (ref 6–23)
CO2: 27 mEq/L (ref 19–32)
CREATININE: 0.67 mg/dL (ref 0.50–1.10)
Calcium: 9.2 mg/dL (ref 8.4–10.5)
Chloride: 101 mEq/L (ref 96–112)
GFR, Est African American: >89 mL/min
GFR, Est Non African American: >89 mL/min
Glucose, Bld: 83 mg/dL (ref 70–99)
Potassium: 3.9 mEq/L (ref 3.5–5.3)
Sodium: 136 mEq/L (ref 135–145)
Total Protein: 8.3 g/dL (ref 6.0–8.3)

## 2013-10-19 MED ORDER — CARBAMAZEPINE 100 MG PO CHEW: 200.0000 mg | CHEWABLE_TABLET | Freq: Two times a day (BID) | ORAL | Status: DC

## 2013-10-19 NOTE — Progress Notes (Signed)
NEUROLOGY FOLLOW UP OFFICE NOTE  Amanda Lozano [id number]  HISTORY OF PRESENT ILLNESS: Amanda Lozano is a 43 year old woman who presents for follow up regarding seizure disorder.  Records personally reviewed.   UPDATE: Current medication:  Tegretol 100mg  in AM and 200mg  at bedtime. 08/04/13 LABS:  WBC 4.4, HGB 11.9, HCT 36, PLT 264, Na 135, K 3.7, BUN 9, Cr 0.70, TB 0.2, AP 235, AST 23, ALT 17. Ordered ambulatory EEG to evaluate staring spells, but patient cancelled.  She had a seizure about 2 weeks ago.  She woke up in the morning feeling a little confused and had a lingering headache.  She had bit her tongue and had lost bladder control.  She recovered much quicker than previous seizures.  She still has staring spells almost daily.  She began seeing a therapist.  Since she doesn't drive, he comes to the house.  She has also worked on improving her diet.  HISTORY: Patient has a history of seizure disorder, diagnosed in [address]1997.  However, she has experienced multiple brief "staring spells" since childhood, looking like she was daydreaming. It would only last a few seconds. In 1997, 3 months after having her second child, she had her first generalized tonic-clonic seizure. At first, it was thought to be a reaction to the Depo shot.  She denies any aura but will sometimes no fatigue. Usually they occurred at night time out of sleep. Often, she would wake up feeling fatigued. She has had some witnessed seizures and was noted to have generalized shaking with eyes rolled back. There would be a tongue laceration but no incontinence.  She was initially followed for several years by a neurologist, Dr. Micheline [place]Boyle, in [place].  Reportedly, MRIs of the brain were unremarkable. She also said she had several EEGs which were unremarkable.    She had been on carbamazepine 100mg  in morning and 200mg  in evening.  Higher doses caused increased lethargy.  She was seizure free from generalized tonic-clonic  seizures for about 7 years up until May 2014. At that time, she woke up one morning and noted generalized fatigue as well as a tongue laceration. This was typical feeling after she would have a seizure during sleep. She went to the emergency room on 09/23/12. A carbamazepine level was 4.9. Sodium was 134, BUN 13, and creatinine 0.70. She reported increased stress and poor sleep, and had missed some doses over the previous 2 weeks. Since that time, she has been compliant with her medications.  Afterwards, she woke up with the same symptoms of headache and sleepiness, suggesting another seizure.  She thinks it may be secondary to stress, as this has been a trigger for seizures in the past. Specifically, she has been having family-related stress, such as one of her children going off to college, to very young children to care for her, and a 41 year old who recently had a baby.it should be noted that she still occasionally has "silent" seizures, namely the staring spells. She does not drive.  She does have a family history of seizures on her father's side, namely her grandmother and her cousin. She had a normal birth. She has no history of meningitis or significant head trauma.   Past AEDs:  Dilantin (elevated LFTs), Topamax (fogginess), Lamictal (could not function)  PAST MEDICAL HISTORY: Past Medical History  Diagnosis Date  . History of carpal tunnel syndrome   . Seizures     Last seizure "years ago"  . Hypertension   .  High cholesterol     MEDICATIONS: Current Outpatient Prescriptions on File Prior to Visit  Medication Sig Dispense Refill  . atenolol (TENORMIN) 100 MG tablet Take 100 mg by mouth daily.      . norethindrone (NORA-BE) 0.35 MG tablet Take 1 tablet (0.35 mg total) by mouth daily.  1 Package  9   No current facility-administered medications on file prior to visit.    ALLERGIES: No Known Allergies  FAMILY HISTORY: Family History  Problem Relation Age of Onset  . Hypertension  Father   . Seizures Paternal Grandmother     SOCIAL HISTORY: History   Social History  . Marital Status: Divorced    Spouse Name: N/A    Number of Children: N/A  . Years of Education: N/A   Occupational History  . Not on file.   Social History Main Topics  . Smoking status: Never Smoker   . Smokeless tobacco: Never Used  . Alcohol Use: No  . Drug Use: No  . Sexual Activity: Yes    Partners: Male    Birth Control/ Protection: Abstinence   Other Topics Concern  . Not on file   Social History Narrative  . No narrative on file    REVIEW OF SYSTEMS: Constitutional: No fevers, chills, or sweats, no generalized fatigue, change in appetite Eyes: No visual changes, double vision, eye pain Ear, nose and throat: No hearing loss, ear pain, nasal congestion, sore throat Cardiovascular: No chest pain, palpitations Respiratory:  No shortness of breath at rest or with exertion, wheezes GastrointestinaI: No nausea, vomiting, diarrhea, abdominal pain, fecal incontinence Genitourinary:  No dysuria, urinary retention or frequency Musculoskeletal:  No neck pain, back pain Integumentary: No rash, pruritus, skin lesions Neurological: as above Psychiatric: depression, anxiety Endocrine: No palpitations, fatigue, diaphoresis, mood swings, change in appetite, change in weight, increased thirst Hematologic/Lymphatic:  No anemia, purpura, petechiae. Allergic/Immunologic: no itchy/runny eyes, nasal congestion, recent allergic reactions, rashes  PHYSICAL EXAM: Filed Vitals:   10/19/13 1421  BP: 122/74  Pulse: 70  Temp: 97.8 F (36.6 C)  Resp: 16   General: No acute distress Head:  Normocephalic/atraumatic Neck: supple, no paraspinal tenderness, full range of motion Heart:  Regular rate and rhythm Lungs:  Clear to auscultation bilaterally Back: No paraspinal tenderness Neurological Exam: alert and oriented to person, place, and time. Attention span and concentration intact, recent and  remote memory intact, fund of knowledge intact.  Speech fluent and not dysarthric, language intact.  CN II-XII intact. Fundoscopic exam unremarkable without vessel changes, exudates, hemorrhages or papilledema.  Bulk and tone normal, muscle strength 5/5 throughout.  Sensation to light touch, temperature and vibration intact.  Deep tendon reflexes 2+ throughout, toes downgoing.  Finger to nose and heel to shin testing intact.  Gait normal, Romberg negative.  IMPRESSION: 1.  Seizure disorder, stable. 2.  Staring spells. 3.  Depression and anxiety  PLAN: 1.  Increase carbamazepine to 200mg  twice daily.  Check trough level, CBC and CMP in one week. 2.  She should call the office if she has another seizure.   3.  Will get 24 hour ambulatory EEG to assess staring spells. 4.  Follow up in 3 months.  Shon MilletAdam Bailynn Dyk, DO  CC: Jackie PlumGeorge Osei-Bonsu, MD

## 2013-10-19 NOTE — Patient Instructions (Signed)
1.  Increase Tegretol to 2 pills (200mg ) in the morning and 2 pills (200mg ) at bedtime. 2.  In one week, check a tegretol level, as well as CBC and CMP 3.  Will get 24 hour ambulatory EEG. 4.  Follow up in 3 months.

## 2013-10-20 ENCOUNTER — Telehealth: Payer: Self-pay | Admitting: *Deleted

## 2013-10-20 LAB — CARBAMAZEPINE LEVEL, TOTAL: Carbamazepine Lvl: 6.2 ug/mL (ref 4.0–12.0)

## 2013-10-20 NOTE — Telephone Encounter (Signed)
Message copied by Fredirick MaudlinVAN DER GLAS, SUSAN E on Tue Oct 20, 2013 11:26 AM ------      Message from: JAFFE, ADAM R      Created: Tue Oct 20, 2013  9:39 AM       Labs okay      ----- Message -----         From: [facility]         Sent: 10/19/2013  11:44 PM           To: Cira ServantAdam Robert Jaffe, DO                   ------

## 2013-11-05 NOTE — Telephone Encounter (Signed)
Error

## 2013-11-17 ENCOUNTER — Encounter: Payer: Self-pay | Admitting: Neurology

## 2013-11-17 ENCOUNTER — Ambulatory Visit (INDEPENDENT_AMBULATORY_CARE_PROVIDER_SITE_OTHER): Payer: Medicaid Other | Admitting: Neurology

## 2013-11-17 VITALS — BP 118/60 | HR 78 | Temp 98.0°F | Resp 16 | Wt 181.2 lb

## 2013-11-17 DIAGNOSIS — R404 Transient alteration of awareness: Secondary | ICD-10-CM

## 2013-11-17 DIAGNOSIS — IMO0001 Reserved for inherently not codable concepts without codable children: Secondary | ICD-10-CM

## 2013-11-17 DIAGNOSIS — R569 Unspecified convulsions: Secondary | ICD-10-CM

## 2013-11-17 DIAGNOSIS — F329 Major depressive disorder, single episode, unspecified: Secondary | ICD-10-CM | POA: Insufficient documentation

## 2013-11-17 DIAGNOSIS — F32A Depression, unspecified: Secondary | ICD-10-CM

## 2013-11-17 DIAGNOSIS — F3289 Other specified depressive episodes: Secondary | ICD-10-CM

## 2013-11-17 MED ORDER — CARBAMAZEPINE 100 MG PO CHEW: 300.0000 mg | CHEWABLE_TABLET | Freq: Two times a day (BID) | ORAL | Status: DC

## 2013-11-17 MED ORDER — FOLIC ACID 1 MG PO TABS: 1.0000 mg | ORAL_TABLET | Freq: Every day | ORAL | Status: DC

## 2013-11-17 NOTE — Patient Instructions (Addendum)
1.  Increase tegretol 100mg  tablets to 3 tablets (300mg ) twice daily.  NOTE:  Tegretol may reduce efficacy of birth control pills. 2.  Start folic acid 1mg  daily 3.  No driving 4.  Follow up as scheduled in September 5.  Have ambulatory EEG.

## 2013-11-17 NOTE — Progress Notes (Signed)
NEUROLOGY FOLLOW UP OFFICE NOTE  Amanda Lozano [id number]  HISTORY OF PRESENT ILLNESS: Amanda Lozano is a 43 year old woman who presents for follow up regarding seizure disorder.  Records personally reviewed.  UPDATE: Current medication:  Tegretol 200mg  bid She woke up this morning from vivid dreams.  She has been experiencing headache with head tingling, dizziness, depression and extreme fatigue.  She has been seeing a therapist, which has been going well.   10/19/13 LABS:  carbamazepine level 6.2, WBC 4.9, HGB 11.9, HCT 36, PLT 274, Na 136, K 3.9, BUN 13, Cr 0.67, TB 0.2, AP 152, AST 27, ALT 19 Ordered ambulatory EEG to evaluate staring spells, scheduled for later this month.  HISTORY: Patient has a history of seizure disorder, diagnosed in [address].  However, she has experienced multiple brief "staring spells" since childhood, looking like she was daydreaming. It would only last a few seconds. In 1997, 3 months after having her second child, she had her first generalized tonic-clonic seizure. At first, it was thought to be a reaction to the Depo shot.  She denies any aura but will sometimes no fatigue. Usually they occurred at night time out of sleep. Often, she would wake up feeling fatigued. She has had some witnessed seizures and was noted to have generalized shaking with eyes rolled back. There would be a tongue laceration but no incontinence.  She was initially followed for several years by a neurologist, Dr. Micheline MazeBoyle, in [place].  Reportedly, MRIs of the brain were unremarkable. She also said she had several EEGs which were unremarkable.    She had been on carbamazepine 100mg  in morning and 200mg  in evening.  Higher doses caused increased lethargy.  She was seizure free from generalized tonic-clonic seizures for about 7 years up until May 2014. At that time, she woke up one morning and noted generalized fatigue as well as a tongue laceration. This was typical feeling after she  would have a seizure during sleep. She went to the emergency room on 09/23/12. A carbamazepine level was 4.9. Sodium was 134, BUN 13, and creatinine 0.70. She reported increased stress and poor sleep, and had missed some doses over the previous 2 weeks. Since that time, she has been compliant with her medications.  She will have episodes about every 6 months or so.  She has disturbed sleep with vivid dreams.  She wakes up with the same symptoms of headache, depression and sleepiness, suggesting another seizure.  This would last a couple of days.  Sometimes, she has bitten her tongue or lost bladder control.  She thinks it may be secondary to stress, as this has been a trigger for seizures in the past. Specifically, she has been having family-related stress, such as one of her children going off to college, to very young children to care for her, and a 41 year old who recently had a baby.it should be noted that she still occasionally has "silent" seizures, namely the staring spells. She does not drive.  She does have a family history of seizures on her father's side, namely her grandmother and her cousin. She had a normal birth. She has no history of meningitis or significant head trauma.   Past AEDs:  Dilantin (elevated LFTs), Topamax (fogginess), Lamictal (could not function)  PAST MEDICAL HISTORY: Past Medical History  Diagnosis Date  . History of carpal tunnel syndrome   . Seizures     Last seizure "years ago"  . Hypertension   . High cholesterol  MEDICATIONS: Current Outpatient Prescriptions on File Prior to Visit  Medication Sig Dispense Refill  . atenolol (TENORMIN) 100 MG tablet Take 100 mg by mouth daily.      . norethindrone (NORA-BE) 0.35 MG tablet Take 1 tablet (0.35 mg total) by mouth daily.  1 Package  9   No current facility-administered medications on file prior to visit.    ALLERGIES: No Known Allergies  FAMILY HISTORY: Family History  Problem Relation Age of Onset  .  Hypertension Father   . Seizures Paternal Grandmother     SOCIAL HISTORY: History   Social History  . Marital Status: Divorced    Spouse Name: N/A    Number of Children: N/A  . Years of Education: N/A   Occupational History  . Not on file.   Social History Main Topics  . Smoking status: Never Smoker   . Smokeless tobacco: Never Used  . Alcohol Use: No  . Drug Use: No  . Sexual Activity: Yes    Partners: Male    Birth Control/ Protection: Abstinence   Other Topics Concern  . Not on file   Social History Narrative  . No narrative on file    REVIEW OF SYSTEMS: Constitutional: No fevers, chills, or sweats, no generalized fatigue, change in appetite Eyes: No visual changes, double vision, eye pain Ear, nose and throat: No hearing loss, ear pain, nasal congestion, sore throat Cardiovascular: No chest pain, palpitations Respiratory:  No shortness of breath at rest or with exertion, wheezes GastrointestinaI: No nausea, vomiting, diarrhea, abdominal pain, fecal incontinence Genitourinary:  No dysuria, urinary retention or frequency Musculoskeletal:  No neck pain, back pain Integumentary: No rash, pruritus, skin lesions Neurological: as above Psychiatric: No depression, insomnia, anxiety Endocrine: No palpitations, fatigue, diaphoresis, mood swings, change in appetite, change in weight, increased thirst Hematologic/Lymphatic:  No anemia, purpura, petechiae. Allergic/Immunologic: no itchy/runny eyes, nasal congestion, recent allergic reactions, rashes  PHYSICAL EXAM: Filed Vitals:   11/17/13 1304  BP: 118/60  Pulse: 78  Temp: 98 F (36.7 C)  Resp: 16   General: No acute distress but stressed and tearful Head:  Normocephalic/atraumatic Neck: supple, no paraspinal tenderness, full range of motion Heart:  Regular rate and rhythm Lungs:  Clear to auscultation bilaterally Back: No paraspinal tenderness Neurological Exam: alert and oriented to person, place, and time.  Attention span and concentration intact, recent and remote memory intact, fund of knowledge intact.  Speech fluent and not dysarthric, language intact.  CN II-XII intact. Fundoscopic exam unremarkable without vessel changes, exudates, hemorrhages or papilledema.  Bulk and tone normal, muscle strength 5/5 throughout.  Sensation to light touch, temperature and vibration intact.  Deep tendon reflexes 2+ throughout, toes downgoing.  Finger to nose and heel to shin testing intact.  Gait normal, Romberg negative.  IMPRESSION: Probable complex partial seizures Staring spells Depression and anxiety  PLAN: 1.  Increase Tegretol to 300mg  twice daily.  Discussed that this may reduce efficacy of birth control pills.  She is aware and is not currently sexually active. 2.  Folic acid 1mg  daily 3.  Ambulatory EEG scheduled. 4.  Follow up in September.  Shon Millet, DO  CC:  [name], MD

## 2013-12-02 ENCOUNTER — Other Ambulatory Visit: Payer: Medicaid Other

## 2013-12-03 ENCOUNTER — Ambulatory Visit (INDEPENDENT_AMBULATORY_CARE_PROVIDER_SITE_OTHER): Payer: Medicaid Other | Admitting: Neurology

## 2013-12-03 DIAGNOSIS — IMO0001 Reserved for inherently not codable concepts without codable children: Secondary | ICD-10-CM

## 2013-12-03 DIAGNOSIS — R404 Transient alteration of awareness: Secondary | ICD-10-CM

## 2013-12-04 NOTE — Procedures (Signed)
24 HOUR AMBULATORY ELECTROENCEPHALOGRAM REPORT  Date of Study: 12/03/2013 to 12/04/2013  Patient's Name: Amanda Lozano MRN: [id number] Date of Birth: [birth year]  Indication: staring spells  Medications: Tegretol  Technical Summary: This is a multichannel digital EEG recording, using the international 10-20 placement system.  Spike detection software was employed.  Description: The EEG background is symmetric, with a well-developed posterior dominant rhythm of 10 Hz, which is reactive to eye opening and closing.  Diffuse beta activity is seen, with a bilateral frontal preponderance.  No focal or generalized abnormalities are seen.  No focal or generalized epileptiform discharges are seen.  Several of the patient's habitual spells were captured on EEG, producing no electrographic correlate.  Stage II sleep is seen, with normal and symmetric sleep patterns.  Hyperventilation and photic stimulation were not performed.  ECG revealed normal cardiac rate and rhythm.  Impression: This is a normal awake and asleep 24 hour ambulatory EEG, without activating procedures.  Several of the patient's habitual spells were captured, with no electrographic correlate, diagnostic for nonepileptic spells.  Adam R. Everlena CooperJaffe, DO

## 2013-12-07 ENCOUNTER — Telehealth: Payer: Self-pay | Admitting: *Deleted

## 2013-12-07 NOTE — Telephone Encounter (Signed)
Message copied by Fredirick MaudlinVAN DER GLAS, SUSAN E on Mon Dec 07, 2013  8:19 AM ------      Message from: JAFFE, ADAM R      Created: Fri Dec 04, 2013  4:38 PM       eeg normal.      ----- Message -----         From: Cira ServantAdam Robert Jaffe, DO         Sent: 12/04/2013   4:37 PM           To: Cira ServantAdam Robert Jaffe, DO                   ------

## 2013-12-07 NOTE — Telephone Encounter (Signed)
Patient is aware of EEG results.   

## 2014-01-19 ENCOUNTER — Encounter: Payer: Self-pay | Admitting: Neurology

## 2014-01-19 ENCOUNTER — Ambulatory Visit (INDEPENDENT_AMBULATORY_CARE_PROVIDER_SITE_OTHER): Payer: Medicaid Other | Admitting: Neurology

## 2014-01-19 VITALS — BP 140/80 | HR 76 | Temp 98.5°F | Resp 16 | Wt 184.6 lb

## 2014-01-19 DIAGNOSIS — IMO0001 Reserved for inherently not codable concepts without codable children: Secondary | ICD-10-CM

## 2014-01-19 DIAGNOSIS — F329 Major depressive disorder, single episode, unspecified: Secondary | ICD-10-CM

## 2014-01-19 DIAGNOSIS — F32A Depression, unspecified: Secondary | ICD-10-CM

## 2014-01-19 DIAGNOSIS — F3289 Other specified depressive episodes: Secondary | ICD-10-CM

## 2014-01-19 DIAGNOSIS — R404 Transient alteration of awareness: Secondary | ICD-10-CM

## 2014-01-19 DIAGNOSIS — G40209 Localization-related (focal) (partial) symptomatic epilepsy and epileptic syndromes with complex partial seizures, not intractable, without status epilepticus: Secondary | ICD-10-CM

## 2014-01-19 LAB — CBC WITH DIFFERENTIAL/PLATELET
Basophils Absolute: 0 10*3/uL (ref 0.0–0.1)
Basophils Relative: 0 % (ref 0–1)
EOS ABS: 0 10*3/uL (ref 0.0–0.7)
Eosinophils Relative: 1 % (ref 0–5)
HCT: 38.3 % (ref 36.0–46.0)
HEMOGLOBIN: 12.3 g/dL (ref 12.0–15.0)
LYMPHS ABS: 1.6 10*3/uL (ref 0.7–4.0)
LYMPHS PCT: 42 % (ref 12–46)
MCH: 27.1 pg (ref 26.0–34.0)
MCHC: 32.1 g/dL (ref 30.0–36.0)
MCV: 84.4 fL (ref 78.0–100.0)
MONOS PCT: 17 % — AB (ref 3–12)
Monocytes Absolute: 0.6 10*3/uL (ref 0.1–1.0)
Neutro Abs: 1.5 10*3/uL — ABNORMAL LOW (ref 1.7–7.7)
Neutrophils Relative %: 40 % — ABNORMAL LOW (ref 43–77)
PLATELETS: 269 10*3/uL (ref 150–400)
RBC: 4.54 MIL/uL (ref 3.87–5.11)
RDW: 14 % (ref 11.5–15.5)
WBC: 3.7 10*3/uL — AB (ref 4.0–10.5)

## 2014-01-19 LAB — COMPLETE METABOLIC PANEL WITH GFR
ALT: 26 U/L (ref 0–35)
AST: 27 U/L (ref 0–37)
Albumin: 4 g/dL (ref 3.5–5.2)
Alkaline Phosphatase: 152 U/L — ABNORMAL HIGH (ref 39–117)
BILIRUBIN TOTAL: 0.3 mg/dL (ref 0.2–1.2)
BUN: 12 mg/dL (ref 6–23)
CHLORIDE: 102 meq/L (ref 96–112)
CO2: 28 meq/L (ref 19–32)
Calcium: 9.2 mg/dL (ref 8.4–10.5)
Creat: 0.76 mg/dL (ref 0.50–1.10)
GLUCOSE: 82 mg/dL (ref 70–99)
Potassium: 4.2 mEq/L (ref 3.5–5.3)
Sodium: 137 mEq/L (ref 135–145)
TOTAL PROTEIN: 8.1 g/dL (ref 6.0–8.3)

## 2014-01-19 NOTE — Progress Notes (Signed)
NEUROLOGY FOLLOW UP OFFICE NOTE  Amanda Lozano [id number]  HISTORY OF PRESENT ILLNESS: Amanda Lozano is a 43 year old woman with history of hypertension and depression who presents for follow up regarding seizure disorder.  Records personally reviewed.  UPDATE: Carbamazepine was increased to  twice daily because she felt that she was still having nocturnal seizures.  At first, it caused sleepiness but she has since adjusted to it.  She also takes Folic acid  daily.  She also had 24 hour ambulatory EEG was performed 12/03/13 to 12/04/13, which captured several staring spells and was a normal awake and asleep study.  She does not feel she has had any nocturnal seizures.  She occasionally feels she has a staring spell.  She started seeing a therapist who comes to the house, which is helping. HISTORY: Patient has a history of seizure disorder, diagnosed in 44.  However, she has experienced multiple brief "staring spells" since childhood, looking like she was daydreaming. It would only last a few seconds. In 1997, 3 months after having her second child, she had her first generalized tonic-clonic seizure. At first, it was thought to be a reaction to the Depo shot.  She denies any aura but will sometimes no fatigue. Usually they occurred at night time out of sleep. Often, she would wake up feeling fatigued. She has had some witnessed seizures and was noted to have generalized shaking with eyes rolled back. There would be a tongue laceration but no incontinence.  She was initially followed for several years by a neurologist, Dr. Micheline Maze, in [place].  Reportedly, MRIs of the brain were unremarkable. She also said she had several EEGs which were unremarkable.    She had been on carbamazepine  in morning and  in evening for many years.  Higher doses caused increased lethargy.  She was seizure free from generalized tonic-clonic seizures for about 7 years up until May 2014. At that  time, she woke up one morning and noted generalized fatigue as well as a tongue laceration. This was typical feeling after she would have a seizure during sleep. She went to the emergency room on 09/23/12. A carbamazepine level was 4.9. Sodium was 134, BUN 13, and creatinine 0.70. She reported increased stress and poor sleep, and had missed some doses over the previous 2 weeks. Since that time, she has been compliant with her medications.  She will have episodes about every 6 months or so.  She has disturbed sleep with vivid dreams.  She wakes up with the same symptoms of headache, depression and sleepiness, suggesting another seizure.  This would last a couple of days.  Sometimes, she has bitten her tongue or lost bladder control.  She thinks it may be secondary to stress, as this has been a trigger for seizures in the past. Specifically, she has been having family-related stress, such as one of her children going off to college, to very young children to care for her, and a 74 year old who recently had a baby.  It should be noted that she still occasionally has "silent" seizures, namely the staring spells. She does not drive.  She does have a family history of seizures on her father's side, namely her grandmother and her cousin. She had a normal birth. She has no history of meningitis or significant head trauma.   Past AEDs:  Dilantin (elevated LFTs), Topamax (fogginess), Lamictal (could not function)  10/19/13 LABS:  carbamazepine level 6.2, WBC 4.9, HGB 11.9, HCT 36, PLT 274,  Na 136, K 3.9, BUN 13, Cr 0.67, TB 0.2, AP 152, AST 27, ALT 19  PAST MEDICAL HISTORY: Past Medical History  Diagnosis Date  . History of carpal tunnel syndrome   . Seizures     Last seizure "years ago"  . Hypertension   . High cholesterol     MEDICATIONS: Current Outpatient Prescriptions on File Prior to Visit  Medication Sig Dispense Refill  . atenolol (TENORMIN) 100 MG tablet Take 100 mg by mouth daily.      .  carbamazepine (TEGRETOL) 100 MG chewable tablet Chew 3 tablets (300 mg total) by mouth 2 (two) times daily.  180 tablet  3  . folic acid (FOLVITE) 1 MG tablet Take 1 tablet (1 mg total) by mouth daily.  30 tablet  6  . norethindrone (NORA-BE) 0.35 MG tablet Take 1 tablet (0.35 mg total) by mouth daily.  1 Package  9   No current facility-administered medications on file prior to visit.    ALLERGIES: No Known Allergies  FAMILY HISTORY: Family History  Problem Relation Age of Onset  . Hypertension Father   . Seizures Paternal Grandmother     SOCIAL HISTORY: History   Social History  . Marital Status: Divorced    Spouse Name: N/A    Number of Children: N/A  . Years of Education: N/A   Occupational History  . Not on file.   Social History Main Topics  . Smoking status: Never Smoker   . Smokeless tobacco: Never Used  . Alcohol Use: No  . Drug Use: No  . Sexual Activity: Not Currently    Partners: Male    Birth Control/ Protection: Abstinence   Other Topics Concern  . Not on file   Social History Narrative  . No narrative on file    REVIEW OF SYSTEMS: Constitutional: No fevers, chills, or sweats, no generalized fatigue, change in appetite Eyes: No visual changes, double vision, eye pain Ear, nose and throat: No hearing loss, ear pain, nasal congestion, sore throat Cardiovascular: No chest pain, palpitations Respiratory:  No shortness of breath at rest or with exertion, wheezes GastrointestinaI: No nausea, vomiting, diarrhea, abdominal pain, fecal incontinence Genitourinary:  No dysuria, urinary retention or frequency Musculoskeletal:  No neck pain, back pain Integumentary: No rash, pruritus, skin lesions Neurological: as above Psychiatric: Depression, anxiety Endocrine: No palpitations, fatigue, diaphoresis, mood swings, change in appetite, change in weight, increased thirst Hematologic/Lymphatic:  No anemia, purpura, petechiae. Allergic/Immunologic: no  itchy/runny eyes, nasal congestion, recent allergic reactions, rashes  PHYSICAL EXAM: Filed Vitals:   01/19/14 0850  BP: 140/80  Pulse: 76  Temp: 98.5 F (36.9 C)  Resp: 16   General: No acute distress Head:  Normocephalic/atraumatic Neck: supple, no paraspinal tenderness, full range of motion Heart:  Regular rate and rhythm Lungs:  Clear to auscultation bilaterally Back: No paraspinal tenderness Neurological Exam: alert and oriented to person, place, and time. Attention span and concentration intact, recent and remote memory intact, fund of knowledge intact.  Speech fluent and not dysarthric, language intact.  CN II-XII intact. Fundoscopic exam unremarkable without vessel changes, exudates, hemorrhages or papilledema.  Bulk and tone normal, muscle strength 5/5 throughout.  Sensation to light touch, temperature and vibration intact.  Deep tendon reflexes 2+ throughout, toes downgoing.  Finger to nose and heel to shin testing intact.  Gait normal, Romberg negative.  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  More likely she is pre-occupied and loses train of thought. Depression  and anxiety  PLAN: 1.  Continue carbamazepine  twice daily and Folic acid  daily 2.  Check CBC w/diff and CMP 3.  Follow up with therapist. 4.  Consider switching atenolol to another anti-hypertensive other than beta blocker, as beta blockers may exacerbate depression. 5.  Follow up in 3 months.  Shon Millet, DO  CC: [name], MD

## 2014-01-19 NOTE — Patient Instructions (Signed)
1.  Continue carbamazepine  twice daily 2.  We will check some blood work 3.  Continue seeing the therapist 4.  Follow up in 3 months.

## 2014-01-27 ENCOUNTER — Telehealth: Payer: Self-pay | Admitting: Neurology

## 2014-01-27 NOTE — Telephone Encounter (Signed)
I spoke with Amanda Lozano regarding the lab results.  It appears that she has had a drop in her WBC over the past year.  In May 2014, it was around 9 and this month it is 3.7.  Carbamazepine may cause a drop in WBC, however she is on such a low dose.  We will recheck the CBC when I see her next time.  I did recommend that she have this checked out and to discuss with her PCP.

## 2014-01-28 ENCOUNTER — Telehealth: Payer: Self-pay | Admitting: *Deleted

## 2014-01-28 NOTE — Telephone Encounter (Signed)
Patients has questions about here medication please call  Call back [phone]

## 2014-01-29 ENCOUNTER — Telehealth: Payer: Self-pay | Admitting: Neurology

## 2014-01-29 NOTE — Telephone Encounter (Signed)
Records release form received from [employer]. Forwarded to HIM at [employer]. Sherri S.

## 2014-03-05 ENCOUNTER — Telehealth: Payer: Self-pay | Admitting: Neurology

## 2014-03-05 NOTE — Telephone Encounter (Signed)
Received request by mail from [employer] requesting medical records. Request sent to HIM at [facility] via interoffice mail for processing / Sherri S.  °

## 2014-03-08 ENCOUNTER — Encounter: Payer: Self-pay | Admitting: Neurology

## 2014-03-26 ENCOUNTER — Other Ambulatory Visit: Payer: Self-pay | Admitting: Neurology

## 2014-03-26 ENCOUNTER — Other Ambulatory Visit: Payer: Self-pay | Admitting: *Deleted

## 2014-03-26 ENCOUNTER — Telehealth: Payer: Self-pay | Admitting: Neurology

## 2014-03-26 MED ORDER — CARBAMAZEPINE 100 MG PO CHEW: 100.0000 mg | CHEWABLE_TABLET | Freq: Two times a day (BID) | ORAL | Status: DC

## 2014-03-26 NOTE — Telephone Encounter (Signed)
Pt called to request refill of Carbamazepine 100mg . / Roanna RaiderSherri

## 2014-04-20 ENCOUNTER — Ambulatory Visit: Payer: Medicaid Other | Admitting: Neurology

## 2014-05-03 ENCOUNTER — Encounter: Payer: Self-pay | Admitting: *Deleted

## 2014-05-04 ENCOUNTER — Encounter: Payer: Self-pay | Admitting: Neurology

## 2014-05-04 ENCOUNTER — Ambulatory Visit (INDEPENDENT_AMBULATORY_CARE_PROVIDER_SITE_OTHER): Payer: Medicaid Other | Admitting: Neurology

## 2014-05-04 VITALS — BP 104/60 | HR 86 | Temp 101.7°F | Resp 18 | Ht 61.0 in | Wt 183.0 lb

## 2014-05-04 DIAGNOSIS — F329 Major depressive disorder, single episode, unspecified: Secondary | ICD-10-CM

## 2014-05-04 DIAGNOSIS — G40209 Localization-related (focal) (partial) symptomatic epilepsy and epileptic syndromes with complex partial seizures, not intractable, without status epilepticus: Secondary | ICD-10-CM

## 2014-05-04 DIAGNOSIS — F32A Depression, unspecified: Secondary | ICD-10-CM

## 2014-05-04 DIAGNOSIS — R509 Fever, unspecified: Secondary | ICD-10-CM

## 2014-05-04 LAB — CBC
HCT: 37 % (ref 36.0–46.0)
HEMOGLOBIN: 12.1 g/dL (ref 12.0–15.0)
MCH: 27.9 pg (ref 26.0–34.0)
MCHC: 32.7 g/dL (ref 30.0–36.0)
MCV: 85.3 fL (ref 78.0–100.0)
MPV: 10.7 fL (ref 8.6–12.4)
PLATELETS: 270 10*3/uL (ref 150–400)
RBC: 4.34 MIL/uL (ref 3.87–5.11)
RDW: 13.5 % (ref 11.5–15.5)
WBC: 3.9 10*3/uL — AB (ref 4.0–10.5)

## 2014-05-04 NOTE — Progress Notes (Signed)
NEUROLOGY FOLLOW UP OFFICE NOTE  Amanda Lozano [id number]  HISTORY OF PRESENT ILLNESS: Amanda Lozano is a 43 year old woman with history of hypertension and depression who presents for follow up regarding seizure disorder.  Records personally reviewed.  UPDATE: She is take carbamazepine 300mg  twice daily and Folic acid 1mg  daily.  She has a seizure approximately once every 2 months.  Her last seizure was this morning.  She reports having vivid dream and when she woke up, she felt fatigued and thinks she bit her lower lip.  She reports increased stress lately due to her ongoing legal issues with her children's father.  She also reports heavy menses, which is a trigger.  She woke up with a temperature as well.  Between May 2014 and September 2015, her WBC dropped from 9 to 3.7. 01/19/14 LABS:  WBC 3.7, HGB 12.3, HCT 38.3, PLT 269, Na 137, K 4.2, glucose 82, BUN 12, Cr 0.76, TB 0.3, ALP 152, AST 27, ALT 26, TP 8.1.  HISTORY: Patient has a history of seizure disorder, diagnosed in 35.  However, she has experienced multiple brief "staring spells" since childhood, looking like she was daydreaming. It would only last a few seconds. In 1997, 3 months after having her second child, she had her first generalized tonic-clonic seizure. At first, it was thought to be a reaction to the Depo shot.  She denies any aura but will sometimes no fatigue. Usually they occurred at night time out of sleep. Often, she would wake up feeling fatigued. She has had some witnessed seizures and was noted to have generalized shaking with eyes rolled back. There would be a tongue laceration but no incontinence.  She was initially followed for several years by a neurologist, Dr. Micheline Maze, in [place].  Reportedly, MRIs of the brain were unremarkable. She also said she had several EEGs which were unremarkable.    She had been on carbamazepine 100mg  in morning and 200mg  in evening for many years.  Higher doses caused  increased lethargy.  She was seizure free from generalized tonic-clonic seizures for about 7 years up until May 2014, presumably due to having missed some doses of her medication.  She will have episodes about every 6 months or so.  She has disturbed sleep with vivid dreams.  She wakes up with the same symptoms of headache, depression and sleepiness, suggesting another seizure.  This would last a couple of days.  Sometimes, she has bitten her tongue or lost bladder control.  It should be noted that she still occasionally has "silent" seizures, namely the staring spells. She does not drive.  24 hour ambulatory EEG was performed 12/03/13 to 12/04/13, which captured several staring spells and was a normal awake and asleep study.  Stress and heavy menses are triggers.  She does have a family history of seizures on her father's side, namely her grandmother and her cousin. She had a normal birth. She has no history of meningitis or significant head trauma.   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 Depression and anxiety  Due to drop in WBC in September, will recheck CBC.  If it is still low, will switch to another agent, such as Keppra.  PAST MEDICAL HISTORY: Past Medical History  Diagnosis Date  . History of carpal tunnel syndrome   . Seizures     Last seizure "years  ago"  . Hypertension   . High cholesterol     MEDICATIONS: Current Outpatient Prescriptions on File Prior to Visit  Medication Sig Dispense Refill  . atenolol (TENORMIN) 100 MG tablet Take 100 mg by mouth daily.    . carbamazepine (TEGRETOL) 100 MG chewable tablet CHEW AND SWALLOW 3 TABLETS BY MOUTH TWICE DAILY 180 tablet 0  . carbamazepine (TEGRETOL) 100 MG chewable tablet Chew 1 tablet (100 mg total) by mouth 2 (two) times daily. 60 tablet 3  . folic acid  (FOLVITE) 1 MG tablet Take 1 tablet (1 mg total) by mouth daily. 30 tablet 6  . norethindrone (NORA-BE) 0.35 MG tablet Take 1 tablet (0.35 mg total) by mouth daily. 1 Package 9   No current facility-administered medications on file prior to visit.    ALLERGIES: No Known Allergies  FAMILY HISTORY: Family History  Problem Relation Age of Onset  . Hypertension Father   . Seizures Paternal Grandmother     SOCIAL HISTORY: History   Social History  . Marital Status: Divorced    Spouse Name: N/A    Number of Children: N/A  . Years of Education: N/A   Occupational History  . Not on file.   Social History Main Topics  . Smoking status: Never Smoker   . Smokeless tobacco: Never Used  . Alcohol Use: No  . Drug Use: No  . Sexual Activity:    Partners: Male    Birth Control/ Protection: Abstinence   Other Topics Concern  . Not on file   Social History Narrative    REVIEW OF SYSTEMS: Constitutional: No fevers, chills, or sweats, no generalized fatigue, change in appetite Eyes: No visual changes, double vision, eye pain Ear, nose and throat: No hearing loss, ear pain, nasal congestion, sore throat Cardiovascular: No chest pain, palpitations Respiratory:  No shortness of breath at rest or with exertion, wheezes GastrointestinaI: No nausea, vomiting, diarrhea, abdominal pain, fecal incontinence Genitourinary:  No dysuria, urinary retention or frequency Musculoskeletal:  No neck pain, back pain Integumentary: No rash, pruritus, skin lesions Neurological: as above Psychiatric: No depression, insomnia, anxiety Endocrine: No palpitations, fatigue, diaphoresis, mood swings, change in appetite, change in weight, increased thirst Hematologic/Lymphatic:  No anemia, purpura, petechiae. Allergic/Immunologic: no itchy/runny eyes, nasal congestion, recent allergic reactions, rashes  PHYSICAL EXAM: Filed Vitals:   05/04/14 0921  BP: 104/60  Pulse: 86  Temp: 101.7 F (38.7 C)    Resp: 18   General: No acute distress Head:  Normocephalic/atraumatic Eyes:  Fundoscopic exam unremarkable without vessel changes, exudates, hemorrhages or papilledema. Neck: supple, no paraspinal tenderness, full range of motion Heart:  Regular rate and rhythm Lungs:  Clear to auscultation bilaterally Back: No paraspinal tenderness Neurological Exam: alert and oriented to person, place, and time. Attention span and concentration intact, recent and remote memory intact, fund of knowledge intact.  Speech fluent and not dysarthric, language intact.  CN II-XII intact. Fundoscopic exam unremarkable without vessel changes, exudates, hemorrhages or papilledema.  Bulk and tone normal, muscle strength 5/5 throughout.  Sensation to light touch, temperature and vibration intact.  Deep tendon reflexes 2+ throughout.  Finger to nose testing intact.  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  Will check another CBC.  If the WBC is stable, we will increase the Tegretol and continue to monitor CBC.  If it is lower, then I would favor switching to another agent, such as Keppra. 2.  If fever persists, advised to contact PCP. 3.  No driving 4.  Follow up in 3 months.  Adam Jaffe,Shon Millet DO  CC:  Jackie PlumGeorge Osei-Bonsu, MD

## 2014-05-04 NOTE — Patient Instructions (Addendum)
We will check another blood test.  Based on the result, I will decide whether to increase the Tegretol or switch to another medication.  Remember, no driving.  Follow up in 3 months.  If you continue to have a fever, please contact your PCP.

## 2014-05-06 NOTE — Progress Notes (Signed)
Note faxed.

## 2014-05-20 ENCOUNTER — Telehealth: Payer: Self-pay | Admitting: Neurology

## 2014-05-20 ENCOUNTER — Other Ambulatory Visit: Payer: Self-pay | Admitting: *Deleted

## 2014-05-20 MED ORDER — CARBAMAZEPINE 100 MG PO CHEW: CHEWABLE_TABLET | ORAL | Status: DC

## 2014-05-20 NOTE — Telephone Encounter (Signed)
Patient requesting refills

## 2014-05-20 NOTE — Telephone Encounter (Signed)
Ordered and patient notified.  

## 2014-05-20 NOTE — Telephone Encounter (Signed)
Pt called requesting a refill for CARBAMAZEPINE 100mg   Pharmacy: [facility] on [phone]

## 2014-06-24 ENCOUNTER — Other Ambulatory Visit: Payer: Self-pay | Admitting: Neurology

## 2014-08-03 ENCOUNTER — Ambulatory Visit (INDEPENDENT_AMBULATORY_CARE_PROVIDER_SITE_OTHER): Payer: Medicaid Other | Admitting: Neurology

## 2014-08-03 ENCOUNTER — Encounter: Payer: Self-pay | Admitting: Neurology

## 2014-08-03 VITALS — BP 140/70 | HR 80 | Temp 98.0°F | Resp 20 | Ht 61.0 in | Wt 188.2 lb

## 2014-08-03 DIAGNOSIS — G40219 Localization-related (focal) (partial) symptomatic epilepsy and epileptic syndromes with complex partial seizures, intractable, without status epilepticus: Secondary | ICD-10-CM | POA: Diagnosis not present

## 2014-08-03 DIAGNOSIS — F329 Major depressive disorder, single episode, unspecified: Secondary | ICD-10-CM | POA: Diagnosis not present

## 2014-08-03 DIAGNOSIS — F32A Depression, unspecified: Secondary | ICD-10-CM

## 2014-08-03 MED ORDER — CARBAMAZEPINE 100 MG PO CHEW: CHEWABLE_TABLET | ORAL | Status: DC

## 2014-08-03 MED ORDER — FOLIC ACID 1 MG PO TABS: 1.0000 mg | ORAL_TABLET | Freq: Every day | ORAL | Status: DC

## 2014-08-03 NOTE — Progress Notes (Signed)
NEUROLOGY FOLLOW UP OFFICE NOTE  Amanda RibasStephanie Salois [id number]  HISTORY OF PRESENT ILLNESS: Amanda Lozano is a 44 year old woman with history of hypertension and depression who presents for follow up regarding seizure disorder.  Labs personally reviewed.  UPDATE: She is take carbamazepine 300mg  twice daily and Folic acid 1mg  daily.  She is doing very well.  She thinks she may have had two spells since last visit.  In February, she woke up feeling dizzy, which was preceded by feeling of fatigue and headache for a couple of days.  This past month, she woke up with bruise on side of face and wondered if she hit herself in her sleep during a spell.  No incontinence or tongue biting.  Her mood has improved.  She is on citalopram.  She has more energy in the morning.  She is exercising again 3 times a week.  Between May 2014 and September 2015, her WBC dropped from 9 to 3.7.  CBC rechecked in December was stable at 3.9.  HISTORY: Patient has a history of seizure disorder, diagnosed in [address].  However, she has experienced multiple brief "staring spells" since childhood, looking like she was daydreaming. It would only last a few seconds. In 1997, 3 months after having her second child, she had her first generalized tonic-clonic seizure. At first, it was thought to be a reaction to the Depo shot.  She denies any aura but will sometimes no fatigue. Usually they occurred at night time out of sleep. Often, she would wake up feeling fatigued. She has had some witnessed seizures and was noted to have generalized shaking with eyes rolled back. There would be a tongue laceration but no incontinence.  She was initially followed for several years by a neurologist, Dr. Micheline MazeBoyle, in [place].  Reportedly, MRIs of the brain were unremarkable. She also said she had several EEGs which were unremarkable.    She had been on carbamazepine 100mg  in morning and 200mg  in evening for many years.  High doses caused  increased lethargy.  She was seizure free from generalized tonic-clonic seizures for about 7 years up until May 2014, presumably due to having missed some doses of her medication.  She will have episodes about every 6 months or so.  She has disturbed sleep with vivid dreams.  She wakes up with the same symptoms of headache, depression and sleepiness, suggesting another seizure.  This would last a couple of days.  Sometimes, she has bitten her tongue or lost bladder control.  It should be noted that she still occasionally has "silent" seizures, namely the staring spells. She does not drive.  24 hour ambulatory EEG was performed 12/03/13 to 12/04/13, which captured several staring spells and was a normal awake and asleep study.  Stress and heavy menses are triggers.  She does have a family history of seizures on her father's side, namely her grandmother and her cousin. She had a normal birth. She has no history of meningitis or significant head trauma.   Past AEDs:  Dilantin (elevated LFTs), Topamax (fogginess), Lamictal (could not function)  PAST MEDICAL HISTORY: Past Medical History  Diagnosis Date  . History of carpal tunnel syndrome   . Seizures     Last seizure "years ago"  . Hypertension   . High cholesterol     MEDICATIONS: Current Outpatient Prescriptions on File Prior to Visit  Medication Sig Dispense Refill  . atenolol (TENORMIN) 100 MG tablet Take 100 mg by mouth daily.    .Marland Kitchen  norethindrone (NORA-BE) 0.35 MG tablet Take 1 tablet (0.35 mg total) by mouth daily. (Patient not taking: Reported on 08/03/2014) 1 Package 9   No current facility-administered medications on file prior to visit.    ALLERGIES: No Known Allergies  FAMILY HISTORY: Family History  Problem Relation Age of Onset  . Hypertension Father   . Seizures Paternal Grandmother     SOCIAL HISTORY: History   Social History  . Marital Status: Divorced    Spouse Name: N/A  . Number of Children: N/A  . Years of  Education: N/A   Occupational History  . Not on file.   Social History Main Topics  . Smoking status: Never Smoker   . Smokeless tobacco: Never Used  . Alcohol Use: No  . Drug Use: No  . Sexual Activity:    Partners: Male    Birth Control/ Protection: Abstinence   Other Topics Concern  . Not on file   Social History Narrative    REVIEW OF SYSTEMS: Constitutional: No fevers, chills, or sweats, no generalized fatigue, change in appetite Eyes: No visual changes, double vision, eye pain Ear, nose and throat: No hearing loss, ear pain, nasal congestion, sore throat Cardiovascular: No chest pain, palpitations Respiratory:  No shortness of breath at rest or with exertion, wheezes GastrointestinaI: No nausea, vomiting, diarrhea, abdominal pain, fecal incontinence Genitourinary:  No dysuria, urinary retention or frequency Musculoskeletal:  No neck pain, back pain Integumentary: No rash, pruritus, skin lesions Neurological: as above Psychiatric: No depression, insomnia, anxiety Endocrine: No palpitations, fatigue, diaphoresis, mood swings, change in appetite, change in weight, increased thirst Hematologic/Lymphatic:  No anemia, purpura, petechiae. Allergic/Immunologic: no itchy/runny eyes, nasal congestion, recent allergic reactions, rashes  PHYSICAL EXAM: Filed Vitals:   08/03/14 0946  BP: 140/70  Pulse: 80  Temp: 98 F (36.7 C)  Resp: 20   General: No acute distress Head:  Normocephalic/atraumatic Eyes:  Fundoscopic exam unremarkable without vessel changes, exudates, hemorrhages or papilledema. Neck: supple, no paraspinal tenderness, full range of motion Heart:  Regular rate and rhythm Lungs:  Clear to auscultation bilaterally Back: No paraspinal tenderness Neurological Exam: alert and oriented to person, place, and time. Attention span and concentration intact, recent and remote memory intact, fund of knowledge intact.  Speech fluent and not dysarthric, language intact.   CN II-XII intact. Fundoscopic exam unremarkable without vessel changes, exudates, hemorrhages or papilledema.  Bulk and tone normal, muscle strength 5/5 throughout.  Sensation to light touch intact.  Deep tendon reflexes 2+ throughout.  Finger to nose testing intact.  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  Continue carbamazepine  twice daily and folic acid  daily 2.  Repeat CBC with diff in 3 months and follow up soon after  15 minutes spent with patient, over 50% spent discussing management.  Shon Millet, DO  CC:  [name], MD

## 2014-08-03 NOTE — Patient Instructions (Signed)
1.  Continue carbamazepine 300mg  twice daily and folic acid 1mg  daily 2.  Repeat CBC with diff in 3 months and follow up soon after

## 2014-09-20 ENCOUNTER — Telehealth: Payer: Self-pay | Admitting: Neurology

## 2014-09-20 NOTE — Telephone Encounter (Signed)
Pt called for Tegretol refill, [facility]. Pt Cb# [phone] / Sherri S.

## 2014-09-20 NOTE — Telephone Encounter (Signed)
I made patient aware that she has 6 refills as of 08/03/14 on medication

## 2014-10-06 ENCOUNTER — Ambulatory Visit: Payer: Self-pay | Admitting: Obstetrics

## 2014-11-11 ENCOUNTER — Ambulatory Visit: Payer: Medicaid Other | Admitting: Neurology

## 2014-12-24 ENCOUNTER — Telehealth: Payer: Self-pay | Admitting: Neurology

## 2014-12-24 NOTE — Telephone Encounter (Signed)
I explained to patient that Dr Everlena Cooper did not request she see a eye DR she would need to contact her PCP for a referral to see one or contact the number on the back of insurance card and call to see who is in her network

## 2014-12-24 NOTE — Telephone Encounter (Signed)
Pt would like a referral to a eye Dr that takes medicaid please call her at [phone]

## 2015-03-02 ENCOUNTER — Ambulatory Visit: Payer: Medicaid Other | Admitting: Neurology

## 2015-04-25 ENCOUNTER — Telehealth: Payer: Self-pay | Admitting: Neurology

## 2015-04-25 MED ORDER — CARBAMAZEPINE 100 MG PO CHEW: CHEWABLE_TABLET | ORAL | Status: DC

## 2015-04-25 NOTE — Telephone Encounter (Signed)
PT called and said she needed seizure medicine/Dawn CB# [phone]

## 2015-04-25 NOTE — Telephone Encounter (Signed)
Last OV: 08/03/14 Next OV: 05/23/15

## 2015-05-23 ENCOUNTER — Encounter: Payer: Self-pay | Admitting: Neurology

## 2015-05-23 ENCOUNTER — Other Ambulatory Visit (INDEPENDENT_AMBULATORY_CARE_PROVIDER_SITE_OTHER): Payer: Medicaid Other

## 2015-05-23 ENCOUNTER — Ambulatory Visit (INDEPENDENT_AMBULATORY_CARE_PROVIDER_SITE_OTHER): Payer: Medicaid Other | Admitting: Neurology

## 2015-05-23 VITALS — BP 134/82 | HR 90 | Ht 61.0 in | Wt 205.0 lb

## 2015-05-23 DIAGNOSIS — G40219 Localization-related (focal) (partial) symptomatic epilepsy and epileptic syndromes with complex partial seizures, intractable, without status epilepticus: Secondary | ICD-10-CM | POA: Diagnosis not present

## 2015-05-23 DIAGNOSIS — R404 Transient alteration of awareness: Secondary | ICD-10-CM

## 2015-05-23 DIAGNOSIS — IMO0001 Reserved for inherently not codable concepts without codable children: Secondary | ICD-10-CM

## 2015-05-23 LAB — COMPREHENSIVE METABOLIC PANEL
ALK PHOS: 144 U/L — AB (ref 39–117)
ALT: 31 U/L (ref 0–35)
AST: 42 U/L — ABNORMAL HIGH (ref 0–37)
Albumin: 3.9 g/dL (ref 3.5–5.2)
BUN: 10 mg/dL (ref 6–23)
CHLORIDE: 103 meq/L (ref 96–112)
CO2: 25 meq/L (ref 19–32)
Calcium: 9.4 mg/dL (ref 8.4–10.5)
Creatinine, Ser: 0.66 mg/dL (ref 0.40–1.20)
GFR: 125.75 mL/min (ref >60.00)
GLUCOSE: 75 mg/dL (ref 70–99)
POTASSIUM: 3.5 meq/L (ref 3.5–5.1)
Sodium: 139 mEq/L (ref 135–145)
TOTAL PROTEIN: 8.9 g/dL — AB (ref 6.0–8.3)
Total Bilirubin: 0.2 mg/dL (ref 0.2–1.2)

## 2015-05-23 MED ORDER — CARBAMAZEPINE 100 MG PO CHEW: CHEWABLE_TABLET | ORAL | Status: DC

## 2015-05-23 NOTE — Progress Notes (Signed)
NEUROLOGY FOLLOW UP OFFICE NOTE  Amanda Lozano [id number]  HISTORY OF PRESENT ILLNESS: Amanda Lozano is a 45 year old woman with history of hypertension and depression who presents for follow up regarding seizure disorder.    UPDATE: She is take carbamazepine 300mg  twice daily and Folic acid 1mg  daily.  She has not had any recurrent seizures since last visit.  She is doing very well.  She is in much better spirits.  She no longer needs to see the therapist.  She is back in school and has 3.68 GPA.  Stress is under much better control.  She is exercising and eating healthy.  Between May 2014 and September 2015, her WBC dropped from 9 to 3.7.  CBC rechecked in December was stable at 3.9.    HISTORY: Patient has a history of seizure disorder, diagnosed in 8.  However, she has experienced multiple brief "staring spells" since childhood, looking like she was daydreaming. It would only last a few seconds. In 1997, 3 months after having her second child, she had her first generalized tonic-clonic seizure. At first, it was thought to be a reaction to the Depo shot.  She denies any aura but will sometimes no fatigue. Usually they occurred at night time out of sleep. Often, she would wake up feeling fatigued. She has had some witnessed seizures and was noted to have generalized shaking with eyes rolled back. There would be a tongue laceration but no incontinence.  She was initially followed for several years by a neurologist, Dr. Micheline Maze, in [place].  Reportedly, MRIs of the brain were unremarkable. She also said she had several EEGs which were unremarkable.    She had been on carbamazepine 100mg  in morning and 200mg  in evening for many years.  High doses caused increased lethargy.  She was seizure free from generalized tonic-clonic seizures for about 7 years up until May 2014, presumably due to having missed some doses of her medication.  She will have episodes about every 6 months or so.   She has disturbed sleep with vivid dreams.  She wakes up with the same symptoms of headache, depression and sleepiness, suggesting another seizure.  This would last a couple of days.  Sometimes, she has bitten her tongue or lost bladder control.  It should be noted that she still occasionally has "silent" seizures, namely the staring spells. She does not drive.  24 hour ambulatory EEG was performed 12/03/13 to 12/04/13, which captured several staring spells and was a normal awake and asleep study.  Stress and heavy menses are triggers.  She does have a family history of seizures on her father's side, namely her grandmother and her cousin. She had a normal birth. She has no history of meningitis or significant head trauma.   Past AEDs:  Dilantin (elevated LFTs), Topamax (fogginess), Lamictal (could not function)  PAST MEDICAL HISTORY: Past Medical History  Diagnosis Date  . History of carpal tunnel syndrome   . Seizures (HCC)     Last seizure "years ago"  . Hypertension   . High cholesterol     MEDICATIONS: Current Outpatient Prescriptions on File Prior to Visit  Medication Sig Dispense Refill  . atenolol (TENORMIN) 100 MG tablet Take 100 mg by mouth daily.    . folic acid (FOLVITE) 1 MG tablet Take 1 tablet (1 mg total) by mouth daily. 30 tablet 5   No current facility-administered medications on file prior to visit.    ALLERGIES: No Known Allergies  FAMILY  HISTORY: Family History  Problem Relation Age of Onset  . Hypertension Father   . Seizures Paternal Grandmother     SOCIAL HISTORY: Social History   Social History  . Marital Status: Divorced    Spouse Name: N/A  . Number of Children: N/A  . Years of Education: N/A   Occupational History  . Not on file.   Social History Main Topics  . Smoking status: Never Smoker   . Smokeless tobacco: Never Used  . Alcohol Use: No  . Drug Use: No  . Sexual Activity:    Partners: Male    Birth Control/ Protection: Abstinence    Other Topics Concern  . Not on file   Social History Narrative    REVIEW OF SYSTEMS: Constitutional: No fevers, chills, or sweats, no generalized fatigue, change in appetite Eyes: No visual changes, double vision, eye pain Ear, nose and throat: No hearing loss, ear pain, nasal congestion, sore throat Cardiovascular: No chest pain, palpitations Respiratory:  No shortness of breath at rest or with exertion, wheezes GastrointestinaI: No nausea, vomiting, diarrhea, abdominal pain, fecal incontinence Genitourinary:  No dysuria, urinary retention or frequency Musculoskeletal:  No neck pain, back pain Integumentary: No rash, pruritus, skin lesions Neurological: as above Psychiatric: No depression, insomnia, anxiety Endocrine: No palpitations, fatigue, diaphoresis, mood swings, change in appetite, change in weight, increased thirst Hematologic/Lymphatic:  No anemia, purpura, petechiae. Allergic/Immunologic: no itchy/runny eyes, nasal congestion, recent allergic reactions, rashes  PHYSICAL EXAM: Filed Vitals:   05/23/15 0929  BP: 134/82  Pulse: 90   General: No acute distress.  Patient appears well-groomed.   Head:  Normocephalic/atraumatic Eyes:  Fundoscopic exam unremarkable without vessel changes, exudates, hemorrhages or papilledema. Neck: supple, no paraspinal tenderness, full range of motion Heart:  Regular rate and rhythm Lungs:  Clear to auscultation bilaterally Back: No paraspinal tenderness Neurological Exam: alert and oriented to person, place, and time. Attention span and concentration intact, recent and remote memory intact, fund of knowledge intact.  Speech fluent and not dysarthric, language intact.  CN II-XII intact. Fundoscopic exam unremarkable without vessel changes, exudates, hemorrhages or papilledema.  Bulk and tone normal, muscle strength 5/5 throughout.  Sensation to light touch intact.  Deep tendon reflexes 2+ throughout.  Finger to nose testing intact.  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  Shon MilletAdam Kelijah Towry, DO  CC:  Jackie PlumGeorge Osei-Bonsu, MD

## 2015-05-23 NOTE — Patient Instructions (Signed)
Continue carbamazepine 100mg  tablets, take 3 tablets twice daily.  Also take folic acid 1mg  daily Follow up in 6 months

## 2015-11-24 ENCOUNTER — Ambulatory Visit: Payer: Medicaid Other | Admitting: Neurology

## 2015-11-25 ENCOUNTER — Other Ambulatory Visit: Payer: Self-pay

## 2015-11-25 ENCOUNTER — Ambulatory Visit (INDEPENDENT_AMBULATORY_CARE_PROVIDER_SITE_OTHER): Payer: Medicaid Other | Admitting: Neurology

## 2015-11-25 ENCOUNTER — Encounter: Payer: Self-pay | Admitting: Neurology

## 2015-11-25 VITALS — BP 150/98 | HR 97 | Ht 61.0 in | Wt 205.0 lb

## 2015-11-25 DIAGNOSIS — G40219 Localization-related (focal) (partial) symptomatic epilepsy and epileptic syndromes with complex partial seizures, intractable, without status epilepticus: Secondary | ICD-10-CM | POA: Diagnosis not present

## 2015-11-25 NOTE — Progress Notes (Signed)
NEUROLOGY FOLLOW UP OFFICE NOTE  Amanda Lozano [id number]  HISTORY OF PRESENT ILLNESS: Amanda Lozano is a 45 year old woman with history of hypertension and depression who presents for follow up regarding seizure disorder.    UPDATE: She is take carbamazepine 321m twice daily and Folic acid 137mdaily.  She has been under a little more stress lately.  Her sister is getting divorced.  Her brother was an innocent bystander who was shot.  He is well.  She feels that she had a couple of seizures in her sleep since last visit, as she felt tired and jittery that day.  LFTs from 05/23/15 showed alk phos 144, AST 42 and ALT 31 with TB 0.2, which appear baseline for her.  HISTORY: Patient has a history of seizure disorder, diagnosed in 1933 However, she has experienced multiple brief "staring spells" since childhood, looking like she was daydreaming. It would only last a few seconds. In 1997, 3 months after having her second child, she had her first generalized tonic-clonic seizure. At first, it was thought to be a reaction to the Depo shot.  She denies any aura but will sometimes no fatigue. Usually they occurred at night time out of sleep. Often, she would wake up feeling fatigued. She has had some witnessed seizures and was noted to have generalized shaking with eyes rolled back. There would be a tongue laceration but no incontinence.  She was initially followed for several years by a neurologist, [place] Reportedly, MRIs of the brain were unremarkable. She also said she had several EEGs which were unremarkable.    She had been on carbamazepine 10059mn morning and 200m55m evening for many years.  High doses caused increased lethargy.  She was seizure free from generalized tonic-clonic seizures for about 7 years up until May 2014, presumably due to having missed some doses of her medication.  She will have episodes about every 6 months or so.  She has disturbed sleep  with vivid dreams.  She wakes up with the same symptoms of headache, depression and sleepiness, suggesting another seizure.  This would last a couple of days.  Sometimes, she has bitten her tongue or lost bladder control.  It should be noted that she still occasionally has "silent" seizures, namely the staring spells. She does not drive.  24 hour ambulatory EEG was performed 12/03/13 to 12/04/13, which captured several staring spells and was a normal awake and asleep study.  Stress and heavy menses are triggers.  She does have a family history of seizures on her father's side, namely her grandmother and her cousin. She had a normal birth. She has no history of meningitis or significant head trauma.   Past AEDs:  Dilantin (elevated LFTs), Topamax (fogginess), Lamictal (could not function)  PAST MEDICAL HISTORY: Past Medical History  Diagnosis Date  . History of carpal tunnel syndrome   . Seizures (HCC)[place]  Last seizure "years ago"  . Hypertension   . High cholesterol     MEDICATIONS: Current Outpatient Prescriptions on File Prior to Visit  Medication Sig Dispense Refill  . atenolol (TENORMIN) 100 MG tablet Take 100 mg by mouth daily.    . carbamazepine (TEGRETOL) 100 MG chewable tablet CHEW AND SWALLOW 3 TABLETS BY MOUTH TWICE DAILY 540 tablet 3  . Cholecalciferol (VITAMIN D) 2000 units CAPS Take by mouth.     No current facility-administered medications on file prior to visit.  ALLERGIES: No Known Allergies  FAMILY HISTORY: Family History  Problem Relation Age of Onset  . Hypertension Father   . Seizures Paternal Grandmother     SOCIAL HISTORY: Social History   Social History  . Marital Status: Divorced    Spouse Name: N/A  . Number of Children: N/A  . Years of Education: N/A   Occupational History  . Not on file.   Social History Main Topics  . Smoking status: Never Smoker   . Smokeless tobacco: Never Used  . Alcohol Use: No  . Drug Use: No  . Sexual Activity:     Partners: Male    Birth Control/ Protection: Abstinence   Other Topics Concern  . Not on file   Social History Narrative    REVIEW OF SYSTEMS: Constitutional: No fevers, chills, or sweats, no generalized fatigue, change in appetite Eyes: No visual changes, double vision, eye pain Ear, nose and throat: No hearing loss, ear pain, nasal congestion, sore throat Cardiovascular: No chest pain, palpitations Respiratory:  No shortness of breath at rest or with exertion, wheezes GastrointestinaI: No nausea, vomiting, diarrhea, abdominal pain, fecal incontinence Genitourinary:  No dysuria, urinary retention or frequency Musculoskeletal:  No neck pain, back pain Integumentary: No rash, pruritus, skin lesions Neurological: as above Psychiatric: No depression, insomnia, anxiety Endocrine: No palpitations, fatigue, diaphoresis, mood swings, change in appetite, change in weight, increased thirst Hematologic/Lymphatic:  No purpura, petechiae. Allergic/Immunologic: no itchy/runny eyes, nasal congestion, recent allergic reactions, rashes  PHYSICAL EXAM: Filed Vitals:   11/25/15 0836  BP: 150/98  Pulse: 97   General: No acute distress.  Patient appears well-groomed.   Head:  Normocephalic/atraumatic Eyes:  Fundi examined but not visualized Neck: supple, no paraspinal tenderness, full range of motion Heart:  Regular rate and rhythm Lungs:  Clear to auscultation bilaterally Back: No paraspinal tenderness Neurological Exam: alert and oriented to person, place, and time. Attention span and concentration intact, recent and remote memory intact, fund of knowledge intact.  Speech fluent and not dysarthric, language intact.  CN II-XII intact. Bulk and tone normal, muscle strength 5/5 throughout.  Sensation to light touch intact.  Deep tendon reflexes 2+ throughout, toes downgoing.  Finger to nose and heel to shin testing intact.  Gait normal, Romberg negative.  IMPRESSION: Localization-related  epilepsy with complex partial seizures.  I question if she is still having seizures in her sleep based on her description of simply feeling unwell during the day.  Stress is likely playing a role. HTN.     PLAN: 1.  Continue carbamazepine 34m twice daily 2.  Check trough level 3.  Follow up in 6 months. 4.  BP elevated today.  Should follow up with PCP.  25 minutes spent face to face with patient, over 50% spent counseling.  AMetta Clines DO  CC:  GBenito Mccreedy MD

## 2015-11-25 NOTE — Patient Instructions (Addendum)
1.  Continue carbamazepine 300mg  twice daily 2.  We will check a trough carbamazepine level 3.  Follow up in 6 months.

## 2015-12-26 ENCOUNTER — Encounter: Payer: Self-pay | Admitting: Hematology

## 2015-12-27 ENCOUNTER — Encounter: Payer: Self-pay | Admitting: Hematology

## 2016-01-16 ENCOUNTER — Ambulatory Visit: Payer: Medicaid Other | Admitting: Hematology

## 2016-01-16 ENCOUNTER — Telehealth: Payer: Self-pay | Admitting: Hematology

## 2016-01-16 NOTE — Telephone Encounter (Signed)
01/16/2016 Appointment rescheduled to 01/30/2016 per patient request. Patient was not aware she had an appointment scheduled for 01/16/2016, she thought it was scheduled for the following Monday. Patient states that her daughter's number is being used and address instead of her's. Patient talked with someone in registration to address situation.

## 2016-01-30 ENCOUNTER — Telehealth: Payer: Self-pay | Admitting: Hematology

## 2016-01-30 ENCOUNTER — Ambulatory Visit (HOSPITAL_BASED_OUTPATIENT_CLINIC_OR_DEPARTMENT_OTHER): Payer: Medicaid Other

## 2016-01-30 ENCOUNTER — Ambulatory Visit (HOSPITAL_BASED_OUTPATIENT_CLINIC_OR_DEPARTMENT_OTHER): Payer: Medicaid Other | Admitting: Hematology

## 2016-01-30 ENCOUNTER — Encounter: Payer: Self-pay | Admitting: Hematology

## 2016-01-30 VITALS — BP 154/85 | HR 91 | Temp 99.3°F | Resp 17 | Ht 61.0 in | Wt 196.1 lb

## 2016-01-30 DIAGNOSIS — D892 Hypergammaglobulinemia, unspecified: Secondary | ICD-10-CM

## 2016-01-30 DIAGNOSIS — I10 Essential (primary) hypertension: Secondary | ICD-10-CM | POA: Diagnosis not present

## 2016-01-30 DIAGNOSIS — E8809 Other disorders of plasma-protein metabolism, not elsewhere classified: Secondary | ICD-10-CM

## 2016-01-30 DIAGNOSIS — E785 Hyperlipidemia, unspecified: Secondary | ICD-10-CM | POA: Diagnosis not present

## 2016-01-30 DIAGNOSIS — R569 Unspecified convulsions: Secondary | ICD-10-CM | POA: Diagnosis not present

## 2016-01-30 LAB — CBC & DIFF AND RETIC
BASO%: 0.2 % (ref 0.0–2.0)
BASOS ABS: 0 10*3/uL (ref 0.0–0.1)
EOS ABS: 0 10*3/uL (ref 0.0–0.5)
EOS%: 0.7 % (ref 0.0–7.0)
HEMATOCRIT: 37.1 % (ref 34.8–46.6)
HEMOGLOBIN: 12 g/dL (ref 11.6–15.9)
IMMATURE RETIC FRACT: 6 % (ref 1.60–10.00)
LYMPH%: 25.1 % (ref 14.0–49.7)
MCH: 27.3 pg (ref 25.1–34.0)
MCHC: 32.3 g/dL (ref 31.5–36.0)
MCV: 84.5 fL (ref 79.5–101.0)
MONO#: 0.4 10*3/uL (ref 0.1–0.9)
MONO%: 8.6 % (ref 0.0–14.0)
NEUT#: 2.7 10*3/uL (ref 1.5–6.5)
NEUT%: 65.4 % (ref 38.4–76.8)
PLATELETS: 201 10*3/uL (ref 145–400)
RBC: 4.39 10*6/uL (ref 3.70–5.45)
RDW: 13.5 % (ref 11.2–14.5)
Retic %: 0.91 % (ref 0.70–2.10)
Retic Ct Abs: 39.95 10*3/uL (ref 33.70–90.70)
WBC: 4.2 10*3/uL (ref 3.9–10.3)
lymph#: 1.1 10*3/uL (ref 0.9–3.3)

## 2016-01-30 LAB — COMPREHENSIVE METABOLIC PANEL
ALBUMIN: 3.7 g/dL (ref 3.5–5.0)
ALK PHOS: 142 U/L (ref 40–150)
ALT: 42 U/L (ref 0–55)
ANION GAP: 8 meq/L (ref 3–11)
AST: 61 U/L — ABNORMAL HIGH (ref 5–34)
BILIRUBIN TOTAL: 0.31 mg/dL (ref 0.20–1.20)
BUN: 8.4 mg/dL (ref 7.0–26.0)
CO2: 25 mEq/L (ref 22–29)
CREATININE: 0.7 mg/dL (ref 0.6–1.1)
Calcium: 9.2 mg/dL (ref 8.4–10.4)
Chloride: 104 mEq/L (ref 98–109)
GLUCOSE: 86 mg/dL (ref 70–140)
Potassium: 3.7 mEq/L (ref 3.5–5.1)
Sodium: 138 mEq/L (ref 136–145)
TOTAL PROTEIN: 9.6 g/dL — AB (ref 6.4–8.3)

## 2016-01-30 LAB — LACTATE DEHYDROGENASE: LDH: 334 U/L — ABNORMAL HIGH (ref 125–245)

## 2016-01-30 NOTE — Progress Notes (Signed)
Marland [facility]    HEMATOLOGY/ONCOLOGY CONSULTATION NOTE  Date of Service: 01/30/2016  Patient Care Team: Benito Mccreedy, MD as PCP - General (Internal Medicine)  CHIEF COMPLAINTS/PURPOSE OF CONSULTATION:  Hypergammaglobulinemia  HISTORY OF PRESENTING ILLNESS:   Amanda Lozano is a wonderful 45 y.o. 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  Patient has a history of hypertension, dyslipidemia, seizures (on chronic seizure medications), depression previously on medications, obesity who recently had routine labs with her primary care physician and was noted to have an elevated total protein level of 8.6 (ULN 8.5) and elevated total globulin at 4.7 (ULN 4.5). Her CBC showed stable hemoglobin of 11.5 normal MCV, normal WBC count and normal platelets. She had a previous CMP on 05/23/2015 that showed a total protein of 8.9 with a total globulin of 5.  Her chemistries recently showed no hypercalcemia, normal creatinine of 0.66. Patient has no new bone pains. He does not feel any differently than she has felt over the last 6-12 months. No recent overt infections. No recent vaccines. No other acute new symptoms. She reports she is trying to lose weight by eating healthy and being more physically active.  MEDICAL HISTORY:  Past Medical History:  Diagnosis Date  . High cholesterol   . History of carpal tunnel syndrome   . Hypertension   . 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 CESAREAN SECTION  09/11/2011   Procedure: CESAREAN SECTION;  Surgeon: Shelly Bombard, MD;  Location: [facility];  Service: Gynecology;  Laterality: N/A;  . NO PAST SURGERIES      SOCIAL HISTORY: Social History   Social History  . Marital status: Divorced    Spouse name: N/A  . Number of  children: N/A  . Years of education: N/A   Occupational History  . Not on file.   Social History Main Topics  . Smoking status: Never Smoker  . Smokeless tobacco: Never Used  . Alcohol use No  . Drug use: No  . Sexual activity: Not Currently    Partners: Male    Birth control/ protection: Abstinence   Other Topics Concern  . Not on file   Social History Narrative  . No narrative on file    FAMILY HISTORY: Family History  Problem Relation Age of Onset  . Hypertension Father   . Seizures Paternal Grandmother     ALLERGIES:  has No Known Allergies.  MEDICATIONS:  Current Outpatient Prescriptions  Medication Sig Dispense Refill  . amLODipine (NORVASC) 10 MG tablet TK 1 T PO ONCE D  1  . atenolol (TENORMIN) 100 MG tablet Take 100 mg by mouth daily.    . carbamazepine (TEGRETOL) 100 MG chewable tablet CHEW AND SWALLOW 3 TABLETS BY MOUTH TWICE DAILY 540 tablet 3   No current facility-administered medications for this visit.     REVIEW OF SYSTEMS:    10 Point review of Systems was done is negative except as noted above.  PHYSICAL EXAMINATION: ECOG PERFORMANCE STATUS: 1 - Symptomatic but completely ambulatory  . Vitals:   01/30/16 1120  BP: (!) 154/85  Pulse: 91  Resp: 17  Temp: 99.3 F (37.4 C)   Filed Weights   01/30/16 1120  Weight: 196 lb 1.6 oz (89 kg)   .Body mass index is 37.05 kg/m.  GENERAL:alert, in  no acute distress and comfortable SKIN: skin color, texture, turgor are normal, no rashes or significant lesions EYES: normal, conjunctiva are pink and non-injected, sclera clear OROPHARYNX:no exudate, no erythema and lips, buccal mucosa, and tongue normal  NECK: supple, no JVD, thyroid normal size, non-tender, without nodularity LYMPH:  no palpable lymphadenopathy in the cervical, axillary or inguinal LUNGS: clear to auscultation with normal respiratory effort HEART: regular rate & rhythm,  no murmurs and no lower extremity edema ABDOMEN: abdomen  soft, non-tender, normoactive bowel sounds  Musculoskeletal: no cyanosis of digits and no clubbing  PSYCH: alert & oriented x 3 with fluent speech NEURO: no focal motor/sensory deficits  LABORATORY DATA:  I have reviewed the data as listed  . CBC Latest Ref Rng & Units 05/04/2014 01/19/2014 10/19/2013  WBC 4.0 - 10.5 K/uL 3.9(L) 3.7(L) 4.9  Hemoglobin 12.0 - 15.0 g/dL 12.1 12.3 11.9(L)  Hematocrit 36.0 - 46.0 % 37.0 38.3 36.0  Platelets 150 - 400 K/uL 270 269 274    . CMP Latest Ref Rng & Units 05/23/2015 01/19/2014 10/19/2013  Glucose 70 - 99 mg/dL 75 82 83  BUN 6 - 23 mg/dL '10 12 13  ' Creatinine 0.40 - 1.20 mg/dL 0.66 0.76 0.67  Sodium 135 - 145 mEq/L 139 137 136  Potassium 3.5 - 5.1 mEq/L 3.5 4.2 3.9  Chloride 96 - 112 mEq/L 103 102 101  CO2 19 - 32 mEq/L '25 28 27  ' Calcium 8.4 - 10.5 mg/dL 9.4 9.2 9.2  Total Protein 6.0 - 8.3 g/dL 8.9(H) 8.1 8.3  Total Bilirubin 0.2 - 1.2 mg/dL 0.2 0.3 0.2  Alkaline Phos 39 - 117 U/L 144(H) 152(H) 152(H)  AST 0 - 37 U/L 42(H) 27 27  ALT 0 - 35 U/L '31 26 19     ' RADIOGRAPHIC STUDIES: I have personally reviewed the radiological images as listed and agreed with the findings in the report. No results found.  ASSESSMENT & PLAN:   45 year old African-American female with  #1 Elevated total protein and total globulin fraction. Could potentially be reactive or related to medications. No obvious infections at this time. Could also be due to relative hemoconcentration from dehydration or some other reason for chronic inflammation. SPEP was not available to determine if the hypergammaglobin anemia has an associated monoclonal protein that might suggest an underlying plasma cell disorder. Given the patient's relatively young age and the likelihood of multiple myeloma would be quite low. She does not have any findings suggesting positivity of any of the CRAB criteria. Patient has no peripheral lymphadenopathy or hepatosplenomegaly to suggest an overt  lymphoproliferative disorder.  Previously was noted to have a borderline elevated alkaline phosphatase about 6-12 months ago. Alkaline phosphatase was about 140-150 is now down to 127 on 11/21/2015. Plan -patient has no clinical or lab evidence of overt myeloma. -We'll repeat labs including CBC, CMP today. -We will get a myeloma panel to further evaluate the hypergammaglobinemiawith quantitative immunoglobulins and serum protein electrophoresis. We'll also get a kappa lambda free light chain. -LDH and sedimentation rate. -we shall call the patient with the results and set up a follow-up only if lab results are concerning. -Patient was recommended maintaining good hydration. -Continue follow-up with primary care physician. Patient was given our contact information in case of any new symptoms suggestive of new bone pathology or other constitutional symptoms.  #2 hypertension #3 dyslipidemia #4 history of epilepsy with seizures #5 depression -Continue follow-up with primary care physician for management of other medical comorbidities  All of  the patients questions were answered with apparent satisfaction. The patient knows to call the clinic with any problems, questions or concerns.  I spent 40 minutes counseling the patient face to face. The total time spent in the appointment was 45 minutes and more than 50% was on counseling and direct patient cares.    Sullivan Lone MD [place] AAHIVMS [facility] Hematology/Oncology Physician [facility]  (Office):       [phone] (Work cell):  [phone] (Fax):           [phone]  01/30/2016 11:37 AM

## 2016-01-30 NOTE — Telephone Encounter (Signed)
Patient sent back to lab. Per 9/25 los f/u as needed

## 2016-01-31 LAB — KAPPA/LAMBDA LIGHT CHAINS
Ig Kappa Free Light Chain: 35 mg/L — ABNORMAL HIGH (ref 3.3–19.4)
Ig Lambda Free Light Chain: 24.6 mg/L (ref 5.7–26.3)
KAPPA/LAMBDA FLC RATIO: 1.42 (ref 0.26–1.65)

## 2016-01-31 LAB — SEDIMENTATION RATE: SED RATE: 39 mm/h — AB (ref 0–32)

## 2016-02-01 LAB — MULTIPLE MYELOMA PANEL, SERUM
ALBUMIN SERPL ELPH-MCNC: 4.1 g/dL (ref 2.9–4.4)
ALPHA 1: 0.2 g/dL (ref 0.0–0.4)
ALPHA2 GLOB SERPL ELPH-MCNC: 0.6 g/dL (ref 0.4–1.0)
Albumin/Glob SerPl: 0.9 (ref 0.7–1.7)
B-Globulin SerPl Elph-Mcnc: 1.3 g/dL (ref 0.7–1.3)
GLOBULIN, TOTAL: 4.6 g/dL — AB (ref 2.2–3.9)
Gamma Glob SerPl Elph-Mcnc: 2.6 g/dL — ABNORMAL HIGH (ref 0.4–1.8)
IGM (IMMUNOGLOBIN M), SRM: 111 mg/dL (ref 26–217)
IgA, Qn, Serum: 445 mg/dL — ABNORMAL HIGH (ref 87–352)
Total Protein: 8.7 g/dL — ABNORMAL HIGH (ref 6.0–8.5)

## 2016-05-25 ENCOUNTER — Telehealth: Payer: Self-pay | Admitting: Neurology

## 2016-05-25 MED ORDER — CARBAMAZEPINE 100 MG PO CHEW: CHEWABLE_TABLET | ORAL | 3 refills | Status: DC

## 2016-05-25 NOTE — Telephone Encounter (Signed)
Patient needs a refill on seizure medication called into [facility] on [address] patient phone number is [phone] please call when it is called in

## 2016-05-29 ENCOUNTER — Ambulatory Visit: Payer: Medicaid Other | Admitting: Neurology

## 2016-08-14 ENCOUNTER — Encounter: Payer: Self-pay | Admitting: Neurology

## 2016-08-14 ENCOUNTER — Ambulatory Visit (INDEPENDENT_AMBULATORY_CARE_PROVIDER_SITE_OTHER): Payer: Medicaid Other | Admitting: Neurology

## 2016-08-14 VITALS — BP 148/92 | HR 81 | Ht 61.0 in | Wt 206.8 lb

## 2016-08-14 DIAGNOSIS — I10 Essential (primary) hypertension: Secondary | ICD-10-CM

## 2016-08-14 DIAGNOSIS — G40219 Localization-related (focal) (partial) symptomatic epilepsy and epileptic syndromes with complex partial seizures, intractable, without status epilepticus: Secondary | ICD-10-CM

## 2016-08-14 NOTE — Progress Notes (Signed)
NEUROLOGY FOLLOW UP OFFICE NOTE  Amanda Lozano [id number]  HISTORY OF PRESENT ILLNESS: Amanda Lozano is a 46 year old woman with history of hypertension and depression who presents for follow up regarding seizure disorder.     UPDATE: She is take carbamazepine  twice daily and Folic acid  daily. She has not had any seizures.  Depression is controlled now.  She is concerned about weight gain.  She walks and eats healthy.  CMP from 01/30/16 showed Na 138, K 3.7, Cl 104, CO2 25, glucose 86, BUN 8.4, Cr 0.7, total bili 0.31, ALP 142, AST 61 and ALT 42.   HISTORY: Patient has a history of seizure disorder, diagnosed in 73.  However, she has experienced multiple brief "staring spells" since childhood, looking like she was daydreaming. It would only last a few seconds. In 1997, 3 months after having her second child, she had her first generalized tonic-clonic seizure. At first, it was thought to be a reaction to the Depo shot.  She denies any aura but will sometimes no fatigue. Usually they occurred at night time out of sleep. Often, she would wake up feeling fatigued. She has had some witnessed seizures and was noted to have generalized shaking with eyes rolled back. There would be a tongue laceration but no incontinence.  She was initially followed for several years by a neurologist, Dr. Micheline Maze, in [place].  Reportedly, MRIs of the brain were unremarkable. She also said she had several EEGs which were unremarkable.     She had been on carbamazepine  in morning and  in evening for many years.  High doses caused increased lethargy.  She was seizure free from generalized tonic-clonic seizures for about 7 years up until May 2014, presumably due to having missed some doses of her medication.  She will have episodes about every 6 months or so.  She has disturbed sleep with vivid dreams.  She wakes up with the same symptoms of headache, depression and sleepiness, suggesting  another seizure.  This would last a couple of days.  Sometimes, she has bitten her tongue or lost bladder control.  It should be noted that she still occasionally has "silent" seizures, namely the staring spells. She does not drive.  24 hour ambulatory EEG was performed 12/03/13 to 12/04/13, which captured several staring spells and was a normal awake and asleep study.  Stress and heavy menses are triggers.   She does have a family history of seizures on her father's side, namely her grandmother and her cousin. She had a normal birth. She has no history of meningitis or significant head trauma.    Past AEDs:  Dilantin (elevated LFTs), Topamax (fogginess), Lamictal (could not function)  PAST MEDICAL HISTORY: Past Medical History:  Diagnosis Date  . High cholesterol   . History of carpal tunnel syndrome   . Hypertension   . Seizures (HCC)    Last seizure "years ago"    MEDICATIONS: Current Outpatient Prescriptions on File Prior to Visit  Medication Sig Dispense Refill  . amLODipine (NORVASC) 10 MG tablet TK 1 T PO ONCE D  1  . atenolol (TENORMIN) 100 MG tablet Take 100 mg by mouth daily.    . carbamazepine (TEGRETOL) 100 MG chewable tablet CHEW AND SWALLOW 3 TABLETS BY MOUTH TWICE DAILY 540 tablet 3   No current facility-administered medications on file prior to visit.     ALLERGIES: No Known Allergies  FAMILY HISTORY: Family History  Problem Relation Age of Onset  .  Hypertension Father   . Seizures Paternal Grandmother     SOCIAL HISTORY: Social History   Social History  . Marital status: Divorced    Spouse name: N/A  . Number of children: N/A  . Years of education: N/A   Occupational History  . Not on file.   Social History Main Topics  . Smoking status: Never Smoker  . Smokeless tobacco: Never Used  . Alcohol use No  . Drug use: No  . Sexual activity: Not Currently    Partners: Male    Birth control/ protection: Abstinence   Other Topics Concern  . Not on file    Social History Narrative  . No narrative on file    REVIEW OF SYSTEMS: Constitutional: No fevers, chills, or sweats, no generalized fatigue, change in appetite Eyes: No visual changes, double vision, eye pain Ear, nose and throat: No hearing loss, ear pain, nasal congestion, sore throat Cardiovascular: No chest pain, palpitations Respiratory:  No shortness of breath at rest or with exertion, wheezes GastrointestinaI: No nausea, vomiting, diarrhea, abdominal pain, fecal incontinence Genitourinary:  No dysuria, urinary retention or frequency Musculoskeletal:  No neck pain, back pain Integumentary: No rash, pruritus, skin lesions Neurological: as above Psychiatric: No depression, insomnia, anxiety Endocrine: Weight gain.  No palpitations, fatigue, diaphoresis, mood swings, change in appetite,increased thirst Hematologic/Lymphatic:  No purpura, petechiae. Allergic/Immunologic: no itchy/runny eyes, nasal congestion, recent allergic reactions, rashes  PHYSICAL EXAM: Vitals:   08/14/16 1111  BP: (!) 148/92  Pulse: 81   General: No acute distress.  Patient appears well-groomed.  Head:  Normocephalic/atraumatic Eyes:  Fundi examined but not visualized Neck: supple, no paraspinal tenderness, full range of motion Heart:  Regular rate and rhythm Lungs:  Clear to auscultation bilaterally Back: No paraspinal tenderness Neurological Exam: alert and oriented to person, place, and time. Attention span and concentration intact, recent and remote memory intact, fund of knowledge intact.  Speech fluent and not dysarthric, language intact.  CN II-XII intact. Bulk and tone normal, muscle strength 5/5 throughout.  Sensation to light touch  intact.  Deep tendon reflexes 2+ throughout.  Finger to nose testing intact.  Gait normal, Romberg negative.  IMPRESSION: Localization-related epilepsy with complex partial seizures.   HTN  PLAN: 1.  Continue carbamazepine  twice daily with folic acid   daily.  We discussed switching to another AED but she doesn't want to risk a recurrence of seizures.  She will try to continue optimizing a healthy lifestyle.  If needed, we can always try switching to zonisamide. 2.  Blood pressure elevated.  Recheck with PCP. 3.  Follow up in 9 months.  17 minutes spent face to face with patient, over 50% spent discussing management.  Shon Millet, DO  CC:  [name], MD

## 2016-08-14 NOTE — Patient Instructions (Addendum)
1. Continue carbamazepine  twice daily and folic acid  daily 2.  Your blood pressure is a little high (140s/90s).  Please see your PCP for recheck 3.  Follow up in 9 months.

## 2016-11-08 ENCOUNTER — Telehealth: Payer: Self-pay | Admitting: Neurology

## 2016-11-08 ENCOUNTER — Other Ambulatory Visit: Payer: Self-pay | Admitting: Neurology

## 2016-11-08 MED ORDER — FOLIC ACID 1 MG PO TABS: 5.0000 mg | ORAL_TABLET | Freq: Every day | ORAL | 5 refills | Status: DC

## 2016-11-08 NOTE — Telephone Encounter (Signed)
PT called and said she was pregnant and wanted to know if Dr Everlena CooperJaffe should change her seizure medicine

## 2016-11-08 NOTE — Telephone Encounter (Signed)
I called and spoke with Amanda Lozano.  Since she is already pregnant, I would not make any changes to her medication.  She has been pregnant on carbamazepine in the past.  I explained that there is a small risk for neural tube defects, so she should take folic acid 5mg  daily (I sent a prescription to her pharmacy [facility]).  I also told her to make sure her OB is aware as she likely will require closer monitoring.  I will have her follow up next available.

## 2016-11-08 NOTE — Telephone Encounter (Signed)
Please schedule her for next available appt.

## 2016-11-08 NOTE — Telephone Encounter (Signed)
Dr Jaffe-  Please advise 

## 2016-11-15 ENCOUNTER — Inpatient Hospital Stay (HOSPITAL_COMMUNITY)
Admission: AD | Admit: 2016-11-15 | Discharge: 2016-11-15 | Disposition: A | Discharge status: Home / Self Care | Payer: Medicaid Other | Source: Ambulatory Visit | Attending: Obstetrics & Gynecology | Admitting: Obstetrics & Gynecology

## 2016-11-15 ENCOUNTER — Encounter (HOSPITAL_COMMUNITY): Payer: Self-pay | Admitting: *Deleted

## 2016-11-15 ENCOUNTER — Inpatient Hospital Stay (HOSPITAL_COMMUNITY): Payer: Medicaid Other

## 2016-11-15 DIAGNOSIS — O34219 Maternal care for unspecified type scar from previous cesarean delivery: Secondary | ICD-10-CM | POA: Insufficient documentation

## 2016-11-15 DIAGNOSIS — O200 Threatened abortion: Secondary | ICD-10-CM | POA: Diagnosis not present

## 2016-11-15 DIAGNOSIS — I200 Unstable angina: Secondary | ICD-10-CM | POA: Diagnosis not present

## 2016-11-15 DIAGNOSIS — Z3491 Encounter for supervision of normal pregnancy, unspecified, first trimester: Secondary | ICD-10-CM

## 2016-11-15 DIAGNOSIS — O99281 Endocrine, nutritional and metabolic diseases complicating pregnancy, first trimester: Secondary | ICD-10-CM | POA: Insufficient documentation

## 2016-11-15 DIAGNOSIS — Z3A01 Less than 8 weeks gestation of pregnancy: Secondary | ICD-10-CM | POA: Diagnosis not present

## 2016-11-15 DIAGNOSIS — O209 Hemorrhage in early pregnancy, unspecified: Secondary | ICD-10-CM | POA: Diagnosis not present

## 2016-11-15 DIAGNOSIS — E7800 Pure hypercholesterolemia, unspecified: Secondary | ICD-10-CM | POA: Insufficient documentation

## 2016-11-15 DIAGNOSIS — O161 Unspecified maternal hypertension, first trimester: Secondary | ICD-10-CM | POA: Insufficient documentation

## 2016-11-15 DIAGNOSIS — G5600 Carpal tunnel syndrome, unspecified upper limb: Secondary | ICD-10-CM | POA: Insufficient documentation

## 2016-11-15 DIAGNOSIS — Z8249 Family history of ischemic heart disease and other diseases of the circulatory system: Secondary | ICD-10-CM | POA: Insufficient documentation

## 2016-11-15 DIAGNOSIS — O99351 Diseases of the nervous system complicating pregnancy, first trimester: Secondary | ICD-10-CM | POA: Insufficient documentation

## 2016-11-15 DIAGNOSIS — Z820 Family history of epilepsy and other diseases of the nervous system: Secondary | ICD-10-CM | POA: Diagnosis not present

## 2016-11-15 DIAGNOSIS — Z79899 Other long term (current) drug therapy: Secondary | ICD-10-CM | POA: Insufficient documentation

## 2016-11-15 LAB — CBC
HCT: 34.5 % — ABNORMAL LOW (ref 36.0–46.0)
Hemoglobin: 11.3 g/dL — ABNORMAL LOW (ref 12.0–15.0)
MCH: 28.5 pg (ref 26.0–34.0)
MCHC: 32.8 g/dL (ref 30.0–36.0)
MCV: 87.1 fL (ref 78.0–100.0)
Platelets: 258 K/uL (ref 150–400)
RBC: 3.96 MIL/uL (ref 3.87–5.11)
RDW: 14.3 % (ref 11.5–15.5)
WBC: 4.5 K/uL (ref 4.0–10.5)

## 2016-11-15 LAB — URINALYSIS, ROUTINE W REFLEX MICROSCOPIC
Bilirubin Urine: NEGATIVE
Glucose, UA: NEGATIVE mg/dL
Ketones, ur: NEGATIVE mg/dL
Leukocytes, UA: NEGATIVE
NITRITE: NEGATIVE
PROTEIN: NEGATIVE mg/dL
SPECIFIC GRAVITY, URINE: 1.013 (ref 1.005–1.030)
WBC, UA: NONE SEEN WBC/hpf (ref 0–5)
pH: 5 (ref 5.0–8.0)

## 2016-11-15 LAB — POCT PREGNANCY, URINE: Preg Test, Ur: POSITIVE — AB

## 2016-11-15 LAB — WET PREP, GENITAL
Sperm: NONE SEEN
Trich, Wet Prep: NONE SEEN
WBC, Wet Prep HPF POC: NONE SEEN
Yeast Wet Prep HPF POC: NONE SEEN

## 2016-11-15 LAB — HCG, QUANTITATIVE, PREGNANCY: hCG, Beta Chain, Quant, S: 2986 m[IU]/mL — ABNORMAL HIGH

## 2016-11-15 NOTE — MAU Provider Note (Signed)
History     CSN: [id number]  Arrival date and time: 11/15/16 2020  First Provider Initiated Contact with Patient 11/15/16 2114      Chief Complaint  Patient presents with  . Vaginal Bleeding   HPI Amanda Lozano is a 46 y.o. [id number] at 6687w2d by LMP who presents with vaginal bleeding. First noted spotting last night. Pink spotting on toilet paper. Denies abdominal pain, vaginal discharge, dysuria, n/v/d, or recent intercourse.   OB History    Gravida Para Term Preterm AB Living   6 4 4  0 1 4   SAB TAB Ectopic Multiple Live Births   1 0 0 0 4      Obstetric Comments   Previous CS for breech       Past Medical History:  Diagnosis Date  . High cholesterol   . History of carpal tunnel syndrome   . Hypertension   . Seizures (HCC)    Last seizure "years ago"    Past Surgical History:  Procedure Laterality Date  . CESAREAN SECTION  09/11/2011   Procedure: CESAREAN SECTION;  Surgeon: Brock Badharles A Harper, MD;  Location: [facility];  Service: Gynecology;  Laterality: N/A;    Family History  Problem Relation Age of Onset  . Hypertension Father   . Seizures Paternal Grandmother     Social History  Substance Use Topics  . Smoking status: Never Smoker  . Smokeless tobacco: Never Used  . Alcohol use No    Allergies: No Known Allergies  Prescriptions Prior to Admission  Medication Sig Dispense Refill Last Dose  . carbamazepine (TEGRETOL) 100 MG chewable tablet CHEW AND SWALLOW 3 TABLETS BY MOUTH TWICE DAILY 540 tablet 3 11/15/2016 at Unknown time  . folic acid (FOLVITE) 1 MG tablet Take 5 tablets (5 mg total) by mouth daily. 150 tablet 5 11/15/2016 at Unknown time  . Prenatal Vit-Fe Fumarate-FA (PRENATAL MULTIVITAMIN) TABS tablet Take 1 tablet by mouth daily at 12 noon.     Marland Kitchen. amLODipine (NORVASC) 10 MG tablet TK 1 T PO ONCE D  1 Taking  . atenolol (TENORMIN) 100 MG tablet Take 100 mg by mouth daily.   Unknown at Unknown time    Review of Systems  Constitutional: Negative.    Gastrointestinal: Negative.   Genitourinary: Positive for vaginal bleeding. Negative for dysuria and vaginal discharge.   Physical Exam   Physical Exam  Nursing note and vitals reviewed. Constitutional: She is oriented to person, place, and time. She appears well-developed and well-nourished. No distress.  HENT:  Head: Normocephalic and atraumatic.  Eyes: Conjunctivae are normal. Right eye exhibits no discharge. Left eye exhibits no discharge. No scleral icterus.  Neck: Normal range of motion.  Respiratory: Effort normal. No respiratory distress.  GI: Soft. There is no tenderness. There is no rebound and no guarding.  Genitourinary: Uterus is enlarged. Cervix exhibits no motion tenderness and no friability. Right adnexum displays no mass and no tenderness. Left adnexum displays no mass and no tenderness. There is bleeding (small amount of dark red blood; no active bleeding) in the vagina.  Genitourinary Comments: Cervix closed  Neurological: She is alert and oriented to person, place, and time.  Skin: Skin is warm and dry. She is not diaphoretic.  Psychiatric: She has a normal mood and affect. Her behavior is normal. Judgment and thought content normal.    MAU Course  Procedures Results for orders placed or performed during the hospital encounter of 11/15/16 (from the past 24 hour(s))  Urinalysis, Routine  w reflex microscopic     Status: Abnormal   Collection Time: 11/15/16  8:49 PM  Result Value Ref Range   Color, Urine YELLOW YELLOW   APPearance CLEAR CLEAR   Specific Gravity, Urine 1.013 1.005 - 1.030   pH 5.0 5.0 - 8.0   Glucose, UA NEGATIVE NEGATIVE mg/dL   Hgb urine dipstick SMALL (A) NEGATIVE   Bilirubin Urine NEGATIVE NEGATIVE   Ketones, ur NEGATIVE NEGATIVE mg/dL   Protein, ur NEGATIVE NEGATIVE mg/dL   Nitrite NEGATIVE NEGATIVE   Leukocytes, UA NEGATIVE NEGATIVE   RBC / HPF 0-5 0 - 5 RBC/hpf   WBC, UA NONE SEEN 0 - 5 WBC/hpf   Bacteria, UA RARE (A) NONE SEEN    Squamous Epithelial / LPF 0-5 (A) NONE SEEN   Mucous PRESENT    Granular Casts, UA PRESENT   Pregnancy, urine POC     Status: Abnormal   Collection Time: 11/15/16  8:57 PM  Result Value Ref Range   Preg Test, Ur POSITIVE (A) NEGATIVE  CBC     Status: Abnormal   Collection Time: 11/15/16  9:16 PM  Result Value Ref Range   WBC 4.5 4.0 - 10.5 K/uL   RBC 3.96 3.87 - 5.11 MIL/uL   Hemoglobin 11.3 (L) 12.0 - 15.0 g/dL   HCT 19.1 (L) 47.8 - 29.5 %   MCV 87.1 78.0 - 100.0 fL   MCH 28.5 26.0 - 34.0 pg   MCHC 32.8 30.0 - 36.0 g/dL   RDW 62.1 30.8 - 65.7 %   Platelets 258 150 - 400 K/uL  hCG, quantitative, pregnancy     Status: Abnormal   Collection Time: 11/15/16  9:16 PM  Result Value Ref Range   hCG, Beta Chain, Quant, S 2,986 (H) <5 mIU/mL  Wet prep, genital     Status: Abnormal   Collection Time: 11/15/16  9:44 PM  Result Value Ref Range   Yeast Wet Prep HPF POC NONE SEEN NONE SEEN   Trich, Wet Prep NONE SEEN NONE SEEN   Clue Cells Wet Prep HPF POC PRESENT (A) NONE SEEN   WBC, Wet Prep HPF POC NONE SEEN NONE SEEN   Sperm NONE SEEN    US Ob Comp Less 14 Wks  Result Date: 11/15/2016 CLINICAL DATA:  Spotting.  Pregnant patient. EXAM: OBSTETRIC <14 WK [place] AND TRANSVAGINAL OB US TECHNIQUE: Both transabdominal and transvaginal ultrasound examinations were performed for complete evaluation of the gestation as well as the maternal uterus, adnexal regions, and pelvic cul-de-sac. Transvaginal technique was performed to assess early pregnancy. COMPARISON:  None. FINDINGS: Intrauterine gestational sac: Single Yolk sac:  Visualized. Embryo:  Not Visualized. Cardiac Activity: Heart Rate:   bpm MSD: 9.6  mm   5 w   5  d CRL:    mm    w    d                  [place] EDC: Subchorionic hemorrhage:  None Maternal uterus/adnexae: The right ovary is not seen. The left ovary is normal. No free fluid in the pelvis. IMPRESSION: 1. Intrauterine pregnancy with a gestational sac and an early yolk sac. No fetal pole  identified. Electronically Signed   By: Gerome Sam III M.D   On: 11/15/2016 22:11   US Ob Transvaginal  Result Date: 11/15/2016 CLINICAL DATA:  Spotting.  Pregnant patient. EXAM: OBSTETRIC <14 WK [place] AND TRANSVAGINAL OB US TECHNIQUE: Both transabdominal and transvaginal ultrasound examinations were performed for complete  evaluation of the gestation as well as the maternal uterus, adnexal regions, and pelvic cul-de-sac. Transvaginal technique was performed to assess early pregnancy. COMPARISON:  None. FINDINGS: Intrauterine gestational sac: Single Yolk sac:  Visualized. Embryo:  Not Visualized. Cardiac Activity: Heart Rate:   bpm MSD: 9.6  mm   5 w   5  d CRL:    mm    w    d                  [place] EDC: Subchorionic hemorrhage:  None Maternal uterus/adnexae: The right ovary is not seen. The left ovary is normal. No free fluid in the pelvis. IMPRESSION: 1. Intrauterine pregnancy with a gestational sac and an early yolk sac. No fetal pole identified. Electronically Signed   By: Gerome Sam III M.D   On: 11/15/2016 22:11    MDM +UPT UA, wet prep, GC/chlamydia, CBC, ABO/Rh, quant hCG, HIV, and [place] today to rule out ectopic pregnancy O positive Ultrasound shows IUGS with yolk sac  Assessment and Plan  A: 1. Vaginal bleeding in pregnancy, first trimester   2. Normal IUP (intrauterine pregnancy) on prenatal ultrasound, first trimester   3. Threatened miscarriage    P: Discharge home Outpatient ultrasound for viability ordered Discussed reasons to return to MAU Keep follow up appointment with OB/PCP  GC/CT pending   Judeth Horn 11/15/2016, 9:14 PM

## 2016-11-15 NOTE — Discharge Instructions (Signed)
Threatened Miscarriage °A threatened miscarriage occurs when you have vaginal bleeding during your first 20 weeks of pregnancy but the pregnancy has not ended. If you have vaginal bleeding during this time, your health care provider will do tests to make sure you are still pregnant. If the tests show you are still pregnant and the developing baby (fetus) inside your womb (uterus) is still growing, your condition is considered a threatened miscarriage. °A threatened miscarriage does not mean your pregnancy will end, but it does increase the risk of losing your pregnancy (complete miscarriage). °What are the causes? °The cause of a threatened miscarriage is usually not known. If you go on to have a complete miscarriage, the most common cause is an abnormal number of chromosomes in the developing baby. Chromosomes are the structures inside cells that hold all your genetic material. °Some causes of vaginal bleeding that do not result in miscarriage include: °· Having sex. °· Having an infection. °· Normal hormone changes of pregnancy. °· Bleeding that occurs when an egg implants in your uterus. ° °What increases the risk? °Risk factors for bleeding in early pregnancy include: °· Obesity. °· Smoking. °· Drinking excessive amounts of alcohol or caffeine. °· Recreational drug use. ° °What are the signs or symptoms? °· Light vaginal bleeding. °· Mild abdominal pain or cramps. °How is this diagnosed? °If you have bleeding with or without abdominal pain before 20 weeks of pregnancy, your health care provider will do tests to check whether you are still pregnant. One important test involves using sound waves and a computer (ultrasound) to create images of the inside of your uterus. Other tests include an internal exam of your vagina and uterus (pelvic exam) and measurement of your baby’s heart rate. °You may be diagnosed with a threatened miscarriage if: °· Ultrasound testing shows you are still pregnant. °· Your baby’s heart  rate is strong. °· A pelvic exam shows that the opening between your uterus and your vagina (cervix) is closed. °· Your heart rate and blood pressure are stable. °· Blood tests confirm you are still pregnant. ° °How is this treated? °No treatments have been shown to prevent a threatened miscarriage from going on to a complete miscarriage. However, the right home care is important. °Follow these instructions at home: °· Make sure you keep all your appointments for prenatal care. This is very important. °· Get plenty of rest. °· Do not have sex or use tampons if you have vaginal bleeding. °· Do not douche. °· Do not smoke or use recreational drugs. °· Do not drink alcohol. °· Avoid caffeine. °Contact a health care provider if: °· You have light vaginal bleeding or spotting while pregnant. °· You have abdominal pain or cramping. °· You have a fever. °Get help right away if: °· You have heavy vaginal bleeding. °· You have blood clots coming from your vagina. °· You have severe low back pain or abdominal cramps. °· You have fever, chills, and severe abdominal pain. °This information is not intended to replace advice given to you by your health care provider. Make sure you discuss any questions you have with your health care provider. °Document Released: 04/23/2005 Document Revised: 09/29/2015 Document Reviewed: 02/17/2013 °Elsevier Interactive Patient Education © 2018 [employer] ° °

## 2016-11-16 LAB — GC/CHLAMYDIA PROBE AMP (~~LOC~~) NOT AT ARMC
Chlamydia: NEGATIVE
Neisseria Gonorrhea: NEGATIVE

## 2016-11-18 ENCOUNTER — Inpatient Hospital Stay (HOSPITAL_COMMUNITY)
Admission: AD | Admit: 2016-11-18 | Discharge: 2016-11-18 | Disposition: A | Discharge status: Home / Self Care | Payer: Medicaid Other | Source: Ambulatory Visit | Attending: Obstetrics & Gynecology | Admitting: Obstetrics & Gynecology

## 2016-11-18 ENCOUNTER — Encounter (HOSPITAL_COMMUNITY): Payer: Self-pay | Admitting: *Deleted

## 2016-11-18 ENCOUNTER — Inpatient Hospital Stay (HOSPITAL_COMMUNITY): Payer: Medicaid Other

## 2016-11-18 DIAGNOSIS — O209 Hemorrhage in early pregnancy, unspecified: Secondary | ICD-10-CM

## 2016-11-18 DIAGNOSIS — O10911 Unspecified pre-existing hypertension complicating pregnancy, first trimester: Secondary | ICD-10-CM

## 2016-11-18 DIAGNOSIS — O039 Complete or unspecified spontaneous abortion without complication: Secondary | ICD-10-CM | POA: Diagnosis not present

## 2016-11-18 DIAGNOSIS — Z3A01 Less than 8 weeks gestation of pregnancy: Secondary | ICD-10-CM | POA: Insufficient documentation

## 2016-11-18 DIAGNOSIS — O208 Other hemorrhage in early pregnancy: Secondary | ICD-10-CM

## 2016-11-18 DIAGNOSIS — I10 Essential (primary) hypertension: Secondary | ICD-10-CM | POA: Diagnosis not present

## 2016-11-18 DIAGNOSIS — O4691 Antepartum hemorrhage, unspecified, first trimester: Secondary | ICD-10-CM | POA: Diagnosis present

## 2016-11-18 LAB — CBC
HCT: 31.8 % — ABNORMAL LOW (ref 36.0–46.0)
HEMOGLOBIN: 10.4 g/dL — AB (ref 12.0–15.0)
MCH: 28.3 pg (ref 26.0–34.0)
MCHC: 32.7 g/dL (ref 30.0–36.0)
MCV: 86.6 fL (ref 78.0–100.0)
Platelets: 243 10*3/uL (ref 150–400)
RBC: 3.67 MIL/uL — AB (ref 3.87–5.11)
RDW: 14.1 % (ref 11.5–15.5)
WBC: 4 10*3/uL (ref 4.0–10.5)

## 2016-11-18 MED ORDER — ATENOLOL 100 MG PO TABS: 100.0000 mg | ORAL_TABLET | Freq: Every day | ORAL | 1 refills | Status: DC

## 2016-11-18 MED ORDER — AMLODIPINE BESYLATE 5 MG PO TABS: 5.0000 mg | ORAL_TABLET | Freq: Every day | ORAL | 1 refills | Status: DC

## 2016-11-18 NOTE — MAU Note (Signed)
Pts d/c BP was elevated at 173/100 pt denies HA, SOB, and chest pain. Provider V.Smith CNM was notified and is going to come evaluate pt before d/c from facility.

## 2016-11-18 NOTE — Discharge Instructions (Signed)

## 2016-11-18 NOTE — MAU Provider Note (Signed)
History     CSN: [id number]  Arrival date and time: 11/18/16 4098   First Provider Initiated Contact with Patient 11/18/16 2034     Chief Complaint  Patient presents with  . Vaginal Bleeding  . Abdominal Pain   HPI Amanda Lozano is a 46 y.o. [id number] at [redacted]w[redacted]d who presents with abdominal pain and vaginal bleeding. She states she slept most of the day and woke up with severe lower abdominal pain and vaginal bleeding. She states she went to the bathroom and had a large gush of blood and passed tissue. After that, the pain subsided and the bleeding has decreased. She reports 0/10 pain and a scant amount of bleeding now.   OB History    Gravida Para Term Preterm AB Living   6 4 4  0 1 4   SAB TAB Ectopic Multiple Live Births   1 0 0 0 4      Obstetric Comments   Previous CS for breech       Past Medical History:  Diagnosis Date  . High cholesterol   . History of carpal tunnel syndrome   . Hypertension   . Seizures (HCC)    Last seizure "years ago"    Past Surgical History:  Procedure Laterality Date  . CESAREAN SECTION  09/11/2011   Procedure: CESAREAN SECTION;  Surgeon: Brock Bad, MD;  Location: [facility];  Service: Gynecology;  Laterality: N/A;    Family History  Problem Relation Age of Onset  . Hypertension Father   . Seizures Paternal Grandmother     Social History  Substance Use Topics  . Smoking status: Never Smoker  . Smokeless tobacco: Never Used  . Alcohol use No    Allergies: No Known Allergies  Prescriptions Prior to Admission  Medication Sig Dispense Refill Last Dose  . carbamazepine (TEGRETOL) 100 MG chewable tablet CHEW AND SWALLOW 3 TABLETS BY MOUTH TWICE DAILY 540 tablet 3 11/18/2016 at Unknown time  . folic acid (FOLVITE) 1 MG tablet Take 5 tablets (5 mg total) by mouth daily. 150 tablet 5 11/18/2016 at Unknown time  . labetalol (NORMODYNE) 100 MG tablet Take 100 mg by mouth 2 (two) times daily.   11/18/2016 at Unknown time  . Prenatal Vit-Fe  Fumarate-FA (PRENATAL MULTIVITAMIN) TABS tablet Take 1 tablet by mouth daily at 12 noon.   11/18/2016 at Unknown time  . atenolol (TENORMIN) 100 MG tablet Take 100 mg by mouth daily.   Unknown at Unknown time    Review of Systems  Constitutional: Negative.  Negative for chills and fever.  HENT: Negative.   Respiratory: Negative.  Negative for shortness of breath.   Cardiovascular: Negative.  Negative for chest pain.  Gastrointestinal: Positive for abdominal pain. Negative for constipation, diarrhea, nausea and vomiting.  Genitourinary: Positive for vaginal bleeding. Negative for dysuria and vaginal discharge.  Neurological: Negative.  Negative for dizziness and headaches.  Psychiatric/Behavioral: Negative.    Physical Exam   Blood pressure (!) 173/89, pulse 100, temperature 98.3 F (36.8 C), temperature source Oral, resp. rate 18, height 5\' 2"  (1.575 m), weight 204 lb (92.5 kg), last menstrual period 09/26/2016, SpO2 100 %.  Physical Exam  Nursing note and vitals reviewed. Constitutional: She appears well-developed and well-nourished.  HENT:  Head: Normocephalic and atraumatic.  Eyes: Conjunctivae are normal. No scleral icterus.  Respiratory: Effort normal. No respiratory distress.  GI: Soft. She exhibits no distension. There is no tenderness.  Neurological: She is alert.  Skin: Skin is warm and  dry.  Psychiatric: She has a normal mood and affect. Her behavior is normal. Judgment and thought content normal.    MAU Course  Procedures Results for orders placed or performed during the hospital encounter of 11/18/16 (from the past 24 hour(s))  CBC     Status: Abnormal   Collection Time: 11/18/16  8:41 PM  Result Value Ref Range   WBC 4.0 4.0 - 10.5 K/uL   RBC 3.67 (L) 3.87 - 5.11 MIL/uL   Hemoglobin 10.4 (L) 12.0 - 15.0 g/dL   HCT 16.131.8 (L) 09.636.0 - 04.546.0 %   MCV 86.6 78.0 - 100.0 fL   MCH 28.3 26.0 - 34.0 pg   MCHC 32.7 30.0 - 36.0 g/dL   RDW 40.914.1 81.111.5 - 91.415.5 %   Platelets 243  150 - 400 K/uL   [place] Ob Transvaginal  Result Date: 11/18/2016 CLINICAL DATA:  46 year old pregnant female presents with vaginal bleeding. EDC by LMP: 07/03/2017, projecting to an expected gestational age of [redacted] weeks 4 days. EXAM: TRANSVAGINAL OB ULTRASOUND TECHNIQUE: Transvaginal ultrasound was performed for complete evaluation of the gestation as well as the maternal uterus, adnexal regions, and pelvic cul-de-sac. COMPARISON:  11/15/2016 obstetric scan. FINDINGS: Anteverted uterus. No uterine fibroids or other myometrial abnormalities. Bilayer endometrial thickness 11 mm. No intrauterine gestational sac identified. Previously visualized intrauterine gestational sac on 11/15/2016 scan is absent on this scan. No endometrial cavity fluid or focal endometrial mass. Left ovary measures 2.6 x 1.3 x 1.9 cm. Right ovary not visualized. No abnormal adnexal masses demonstrated. No abnormal free fluid in the pelvis. IMPRESSION: Previously visualized intrauterine gestational sac on 11/15/2016 obstetric scan is absent on this scan. Findings are compatible with spontaneous abortion in progress in this patient presenting with vaginal bleeding. No adnexal abnormalities. Electronically Signed   By: Delbert PhenixJason A Poff M.D.   On: 11/18/2016 21:15   MDM CBC O Pos blood type US OB Transvaginal US shows previously seen gestational sac no longer in uterus Assessment and Plan   1. Complete miscarriage   2. Vaginal bleeding affecting early pregnancy   3. Maternal chronic hypertension in first trimester   4. Uncontrolled hypertension    -Discharge patient home in stable condition -Pain and bleeding precautions reviewed with patient -[facility] for bHCG and in 2 weeks with a provider -Encouraged to return here or to other Urgent Care/ED if she develops worsening of symptoms, increase in pain, fever, or other concerning symptoms.   Cleone SlimCaroline Neill SNM 11/18/2016, 9:53 PM   I confirm that I have verified  the information documented in the nurse midwife student's note and that I have also personally reperformed the physical exam and all medical decision making activities.Pt has CHTN. BP 170/100's in MAU. Denies HA, chest pain, SOB. Was taken off of Norvasc and Atenolol when Dx' d w/ pregnancy. Switched to Labetalol. 100 MG PO BID. Took Labetalol in MAU today. Severely elevated BP w/out evidence of end organ involvement. Discussed Hx, labs, exam w/ Dr. Despina HiddenEure. Agrees w/ POC. New orders: Restart Atenolol and Norvasc OP. Rx's given. HTN precautions reviewed.    Katrinka BlazingSmith, [place], [place] 11/19/2016 4:38 AM

## 2016-11-18 NOTE — MAU Note (Signed)
Pt is here after miscarriage she thinks, 7 weeks. Had bleeding and cramping today then passed tissue. Bleeding and pain have decreased since passing tissue.

## 2016-11-20 ENCOUNTER — Telehealth: Payer: Self-pay | Admitting: Neurology

## 2016-11-20 NOTE — Telephone Encounter (Signed)
Patient cancelled follow up visit with Dr. Everlena CooperJaffe for tomorrow. She has just been discharged from the hospital and requested to speak to Dr. Everlena CooperJaffe. Please call. Thanks

## 2016-11-20 NOTE — Telephone Encounter (Signed)
I spoke with patient and she just wanted to let you know that she did have a miscarriage and she is now at home resting.

## 2016-11-21 ENCOUNTER — Ambulatory Visit: Payer: Medicaid Other | Admitting: Neurology

## 2016-11-26 ENCOUNTER — Telehealth: Payer: Self-pay | Admitting: *Deleted

## 2016-11-26 ENCOUNTER — Other Ambulatory Visit: Payer: Medicaid Other

## 2016-11-26 ENCOUNTER — Telehealth: Payer: Self-pay | Admitting: Neurology

## 2016-11-26 DIAGNOSIS — O039 Complete or unspecified spontaneous abortion without complication: Secondary | ICD-10-CM

## 2016-11-26 NOTE — Telephone Encounter (Signed)
Patient called in to say that she had a Seizure yesterday. She said she is in so much pain and cannot even get out of bed. Please Advise. Thanks

## 2016-11-26 NOTE — Telephone Encounter (Signed)
Patient called and said that she had a seizure yesterday and is in so much pain today from her muscles tensing up.  She would like to get in to see you asap.  Please advise.

## 2016-11-26 NOTE — Telephone Encounter (Signed)
Note sent to Jaffe. 

## 2016-11-26 NOTE — Telephone Encounter (Signed)
She can come in tomorrow.  There are openings.

## 2016-11-26 NOTE — Telephone Encounter (Signed)
Patient coming in tomorrow at 9:15.

## 2016-11-27 ENCOUNTER — Encounter: Payer: Self-pay | Admitting: Neurology

## 2016-11-27 ENCOUNTER — Other Ambulatory Visit: Payer: Medicaid Other

## 2016-11-27 ENCOUNTER — Ambulatory Visit (INDEPENDENT_AMBULATORY_CARE_PROVIDER_SITE_OTHER): Payer: Medicaid Other | Admitting: Neurology

## 2016-11-27 VITALS — BP 100/60 | HR 95 | Ht 61.0 in | Wt 199.2 lb

## 2016-11-27 DIAGNOSIS — G40219 Localization-related (focal) (partial) symptomatic epilepsy and epileptic syndromes with complex partial seizures, intractable, without status epilepticus: Secondary | ICD-10-CM

## 2016-11-27 DIAGNOSIS — Z79899 Other long term (current) drug therapy: Secondary | ICD-10-CM

## 2016-11-27 LAB — BETA HCG QUANT (REF LAB): hCG Quant: 15 m[IU]/mL

## 2016-11-27 MED ORDER — GABAPENTIN 100 MG PO CAPS: 100.0000 mg | ORAL_CAPSULE | Freq: Three times a day (TID) | ORAL | 3 refills | Status: DC

## 2016-11-27 NOTE — Patient Instructions (Addendum)
1.  Continue carbamazepine 300mg  twice daily for now.  We will check a carbamazepine level.  2.  To help with pain, we will start gabapentin 100mg  three times daily.   3.  I want to get a baseline MRI of the brain given your history of seizures. 4.  Follow up in 4 months.

## 2016-11-27 NOTE — Progress Notes (Signed)
NEUROLOGY FOLLOW UP OFFICE NOTE  Amanda Lozano [id number]  HISTORY OF PRESENT ILLNESS: Amanda Lozano is a 46 year old woman with history of hypertension and depression who presents for follow up regarding seizure disorder.     UPDATE: She take carbamazepine 300mg  twice daily.  She contacted me a couple of weeks ago to let me know she was [redacted] weeks pregnant.  I advised to increase folic acid to 5mg  daily with close monitoring with her OB/GYN.  About a week later, she suffered a miscarriage.  She has been dealing with the trauma of this event.  For a few days, she was experiencing sharp head pain which commonly occurs prior to a seizure.  2 days ago, she woke up in diffuse severe pain, which she suspects is from a likely seizure during sleep.   11/18/16 CBC: WBC 4, HGB 10.4, HCT 31.8, MCV 86.6, PLT 243.   HISTORY: Patient has a history of seizure disorder, diagnosed in 121997.  However, she has experienced multiple brief "staring spells" since childhood, looking like she was daydreaming. It would only last a few seconds. In 1997, 3 months after having her second child, she had her first generalized tonic-clonic seizure. At first, it was thought to be a reaction to the Depo shot.  She denies any aura but will sometimes no fatigue. Usually they occurred at night time out of sleep. Often, she would wake up feeling fatigued. She has had some witnessed seizures and was noted to have generalized shaking with eyes rolled back. There would be a tongue laceration but no incontinence.  She was initially followed for several years by a neurologist, Dr. Micheline MazeBoyle, in [place].  Reportedly, MRIs of the brain were unremarkable. She also said she had several EEGs which were unremarkable.     She had been on carbamazepine 100mg  in morning and 200mg  in evening for many years.  High doses caused increased lethargy.  She was seizure free from generalized tonic-clonic seizures for about 7 years up until May 2014,  presumably due to having missed some doses of her medication.  She will have episodes about every 6 months or so.  She has disturbed sleep with vivid dreams.  She wakes up with the same symptoms of headache, depression and sleepiness, suggesting another seizure.  This would last a couple of days.  Sometimes, she has bitten her tongue or lost bladder control.  It should be noted that she still occasionally has "silent" seizures, namely the staring spells. She does not drive.  24 hour ambulatory EEG was performed 12/03/13 to 12/04/13, which captured several staring spells and was a normal awake and asleep study.  Stress and heavy menses are triggers.   She does have a family history of seizures on her father's side, namely her grandmother and her cousin. She had a normal birth. She has no history of meningitis or significant head trauma.    Past AEDs:  Dilantin (elevated LFTs), Topamax (fogginess), Lamictal (could not function)  PAST MEDICAL HISTORY: Past Medical History:  Diagnosis Date  . High cholesterol   . History of carpal tunnel syndrome   . Hypertension   . Seizures (HCC)    Last seizure "years ago"    MEDICATIONS: Current Outpatient Prescriptions on File Prior to Visit  Medication Sig Dispense Refill  . amLODipine (NORVASC) 5 MG tablet Take 1 tablet (5 mg total) by mouth daily. 30 tablet 1  . atenolol (TENORMIN) 100 MG tablet Take 1 tablet (100 mg total)  by mouth daily. 30 tablet 1  . carbamazepine (TEGRETOL) 100 MG chewable tablet CHEW AND SWALLOW 3 TABLETS BY MOUTH TWICE DAILY 540 tablet 3  . folic acid (FOLVITE) 1 MG tablet Take 5 tablets (5 mg total) by mouth daily. 150 tablet 5  . Prenatal Vit-Fe Fumarate-FA (PRENATAL MULTIVITAMIN) TABS tablet Take 1 tablet by mouth daily at 12 noon.     No current facility-administered medications on file prior to visit.     ALLERGIES: No Known Allergies  FAMILY HISTORY: Family History  Problem Relation Age of Onset  . Hypertension Father    . Seizures Paternal Grandmother     SOCIAL HISTORY: Social History   Social History  . Marital status: Divorced    Spouse name: N/A  . Number of children: N/A  . Years of education: N/A   Occupational History  . Not on file.   Social History Main Topics  . Smoking status: Never Smoker  . Smokeless tobacco: Never Used  . Alcohol use No  . Drug use: No  . Sexual activity: Not Currently    Partners: Male    Birth control/ protection: Abstinence   Other Topics Concern  . Not on file   Social History Narrative  . No narrative on file    REVIEW OF SYSTEMS: Constitutional: No fevers, chills, or sweats, no generalized fatigue, change in appetite Eyes: No visual changes, double vision, eye pain Ear, nose and throat: No hearing loss, ear pain, nasal congestion, sore throat Cardiovascular: No chest pain, palpitations Respiratory:  No shortness of breath at rest or with exertion, wheezes GastrointestinaI: No nausea, vomiting, diarrhea, abdominal pain, fecal incontinence Genitourinary:  No dysuria, urinary retention or frequency Musculoskeletal:  Diffuse pain Integumentary: No rash, pruritus, skin lesions Neurological: as above Psychiatric: No depression, insomnia, anxiety Endocrine: No palpitations, fatigue, diaphoresis, mood swings, change in appetite, change in weight, increased thirst Hematologic/Lymphatic:  No purpura, petechiae. Allergic/Immunologic: no itchy/runny eyes, nasal congestion, recent allergic reactions, rashes  PHYSICAL EXAM: Vitals:   11/27/16 0904  BP: 100/60  Pulse: 95   General: No acute distress.  Patient appears well-groomed.   Head:  Normocephalic/atraumatic Eyes:  Fundi examined but not visualized Neck: supple, paraspinal tenderness, full range of motion Heart:  Regular rate and rhythm Lungs:  Clear to auscultation bilaterally Back: paraspinal tenderness Neurological Exam: alert and oriented to person, place, and time. Attention span and  concentration intact, recent and remote memory intact, fund of knowledge intact.  Speech fluent and not dysarthric, language intact.  CN II-XII intact. Bulk and tone normal, muscle strength 5/5 throughout.  Sensation to light touch, temperature and vibration intact.  Deep tendon reflexes 2+ throughout, toes downgoing.  Finger to nose and heel to shin testing intact.  Gait normal, Romberg negative.  IMPRESSION: History of complex partial seizures, usually during sleep.  No recent staring spells.  Her seizure over the weekend was the first in a long time.  Likely triggered by recent stressors related to her miscarriage.  PLAN: 1.  She will continue carbamazepine 300mg  twice daily.  I will check a trough level and CMP. 2.  For pain, I will prescribe her gabapentin 100mg  three times daily.  I expect she should improve over the next few days.  I would use muscle relaxants with caution as it may lower seizure threshold.   3.  In addition, I would like to get a baseline MRI of the brain as we don't have one in our records.  4.  Follow  up in 4 months.  25 minutes spent face to face with patient, over 50% spent discussing management.  Shon Millet, DO  CC: [name], MD

## 2016-11-29 ENCOUNTER — Ambulatory Visit (HOSPITAL_COMMUNITY): Payer: Medicaid Other

## 2016-12-03 ENCOUNTER — Telehealth: Payer: Self-pay | Admitting: *Deleted

## 2016-12-03 ENCOUNTER — Ambulatory Visit: Payer: Medicaid Other | Admitting: Obstetrics and Gynecology

## 2016-12-03 NOTE — Telephone Encounter (Signed)
Amanda Lozano missed her scheduled appointment for follow up after sab. I called her and notified her she missed her follow up and asked if we could reschedule. She states she had called to cancel and can't come right now because she is having seizures and she will call to reschedule when she is doing better.

## 2016-12-16 ENCOUNTER — Emergency Department (HOSPITAL_COMMUNITY): Payer: Medicaid Other

## 2016-12-16 ENCOUNTER — Inpatient Hospital Stay (HOSPITAL_COMMUNITY)
Admission: EM | Admit: 2016-12-16 | Discharge: 2016-12-19 | DRG: 871 | Disposition: A | Discharge status: Home / Self Care | Payer: Medicaid Other | Attending: Family Medicine | Admitting: Family Medicine

## 2016-12-16 ENCOUNTER — Encounter (HOSPITAL_COMMUNITY): Payer: Self-pay | Admitting: Emergency Medicine

## 2016-12-16 DIAGNOSIS — I10 Essential (primary) hypertension: Secondary | ICD-10-CM | POA: Diagnosis present

## 2016-12-16 DIAGNOSIS — J13 Pneumonia due to Streptococcus pneumoniae: Secondary | ICD-10-CM | POA: Diagnosis present

## 2016-12-16 DIAGNOSIS — R350 Frequency of micturition: Secondary | ICD-10-CM | POA: Diagnosis present

## 2016-12-16 DIAGNOSIS — Z9889 Other specified postprocedural states: Secondary | ICD-10-CM

## 2016-12-16 DIAGNOSIS — R000 Tachycardia, unspecified: Secondary | ICD-10-CM | POA: Diagnosis present

## 2016-12-16 DIAGNOSIS — Z79899 Other long term (current) drug therapy: Secondary | ICD-10-CM

## 2016-12-16 DIAGNOSIS — E7800 Pure hypercholesterolemia, unspecified: Secondary | ICD-10-CM | POA: Diagnosis present

## 2016-12-16 DIAGNOSIS — M549 Dorsalgia, unspecified: Secondary | ICD-10-CM | POA: Diagnosis present

## 2016-12-16 DIAGNOSIS — A419 Sepsis, unspecified organism: Secondary | ICD-10-CM | POA: Diagnosis not present

## 2016-12-16 DIAGNOSIS — J918 Pleural effusion in other conditions classified elsewhere: Secondary | ICD-10-CM | POA: Diagnosis present

## 2016-12-16 DIAGNOSIS — J181 Lobar pneumonia, unspecified organism: Secondary | ICD-10-CM

## 2016-12-16 DIAGNOSIS — Z8249 Family history of ischemic heart disease and other diseases of the circulatory system: Secondary | ICD-10-CM

## 2016-12-16 DIAGNOSIS — R569 Unspecified convulsions: Secondary | ICD-10-CM

## 2016-12-16 DIAGNOSIS — J90 Pleural effusion, not elsewhere classified: Secondary | ICD-10-CM

## 2016-12-16 DIAGNOSIS — J189 Pneumonia, unspecified organism: Secondary | ICD-10-CM | POA: Diagnosis present

## 2016-12-16 DIAGNOSIS — A403 Sepsis due to Streptococcus pneumoniae: Principal | ICD-10-CM | POA: Diagnosis present

## 2016-12-16 LAB — URINALYSIS, ROUTINE W REFLEX MICROSCOPIC
Bacteria, UA: NONE SEEN
Bilirubin Urine: NEGATIVE
Glucose, UA: NEGATIVE mg/dL
Ketones, ur: 20 mg/dL — AB
Leukocytes, UA: NEGATIVE
Nitrite: NEGATIVE
Protein, ur: 100 mg/dL — AB
Specific Gravity, Urine: 1.024 (ref 1.005–1.030)
pH: 6 (ref 5.0–8.0)

## 2016-12-16 LAB — CBC
HCT: 29.5 % — ABNORMAL LOW (ref 36.0–46.0)
Hemoglobin: 9.4 g/dL — ABNORMAL LOW (ref 12.0–15.0)
MCH: 26.3 pg (ref 26.0–34.0)
MCHC: 31.9 g/dL (ref 30.0–36.0)
MCV: 82.6 fL (ref 78.0–100.0)
PLATELETS: 519 10*3/uL — AB (ref 150–400)
RBC: 3.57 MIL/uL — ABNORMAL LOW (ref 3.87–5.11)
RDW: 14.6 % (ref 11.5–15.5)
WBC: 9.6 10*3/uL (ref 4.0–10.5)

## 2016-12-16 LAB — I-STAT BETA HCG BLOOD, ED (MC, WL, AP ONLY)

## 2016-12-16 LAB — BASIC METABOLIC PANEL
Anion gap: 14 (ref 5–15)
BUN: 10 mg/dL (ref 6–20)
CALCIUM: 8.3 mg/dL — AB (ref 8.9–10.3)
CO2: 24 mmol/L (ref 22–32)
CREATININE: 0.86 mg/dL (ref 0.44–1.00)
Chloride: 97 mmol/L — ABNORMAL LOW (ref 101–111)
GFR calc Af Amer: >60 mL/min (ref >60)
Glucose, Bld: 117 mg/dL — ABNORMAL HIGH (ref 65–99)
Potassium: 3 mmol/L — ABNORMAL LOW (ref 3.5–5.1)
SODIUM: 135 mmol/L (ref 135–145)

## 2016-12-16 LAB — I-STAT CG4 LACTIC ACID, ED
LACTIC ACID, VENOUS: 1.85 mmol/L (ref 0.5–1.9)
Lactic Acid, Venous: 1.31 mmol/L (ref 0.5–1.9)

## 2016-12-16 LAB — CARBAMAZEPINE LEVEL, TOTAL: Carbamazepine Lvl: 11 ug/mL (ref 4.0–12.0)

## 2016-12-16 LAB — PREGNANCY, URINE: Preg Test, Ur: NEGATIVE

## 2016-12-16 MED ORDER — SODIUM CHLORIDE 0.9 % IV BOLUS (SEPSIS)
1000.0000 mL | Freq: Once | INTRAVENOUS | Status: AC
  Administered 2016-12-16: 1000 mL via INTRAVENOUS

## 2016-12-16 MED ORDER — ACETAMINOPHEN 500 MG PO TABS
1000.0000 mg | ORAL_TABLET | Freq: Once | ORAL | Status: AC
  Administered 2016-12-16: 1000 mg via ORAL
  Filled 2016-12-16: qty 2

## 2016-12-16 MED ORDER — ONDANSETRON HCL 4 MG/2ML IJ SOLN
4.0000 mg | Freq: Once | INTRAMUSCULAR | Status: AC
  Administered 2016-12-16: 4 mg via INTRAVENOUS
  Filled 2016-12-16: qty 2

## 2016-12-16 MED ORDER — ATENOLOL 50 MG PO TABS
100.0000 mg | ORAL_TABLET | Freq: Every day | ORAL | Status: DC
  Administered 2016-12-16 – 2016-12-19 (×4): 100 mg via ORAL
  Filled 2016-12-16 (×4): qty 2

## 2016-12-16 MED ORDER — AZITHROMYCIN 500 MG PO TABS: 500.0000 mg | ORAL_TABLET | ORAL | Status: DC

## 2016-12-16 MED ORDER — SODIUM CHLORIDE 0.9 % IV SOLN: 250.0000 mL | INTRAVENOUS | Status: DC | PRN

## 2016-12-16 MED ORDER — POTASSIUM CHLORIDE CRYS ER 20 MEQ PO TBCR
40.0000 meq | EXTENDED_RELEASE_TABLET | Freq: Once | ORAL | Status: AC
  Administered 2016-12-16: 40 meq via ORAL
  Filled 2016-12-16: qty 2

## 2016-12-16 MED ORDER — POTASSIUM CHLORIDE 10 MEQ/100ML IV SOLN: 10.0000 meq | INTRAVENOUS | Status: AC

## 2016-12-16 MED ORDER — SODIUM CHLORIDE 0.9% FLUSH: 3.0000 mL | INTRAVENOUS | Status: DC | PRN

## 2016-12-16 MED ORDER — ONDANSETRON HCL 4 MG PO TABS: 4.0000 mg | ORAL_TABLET | Freq: Four times a day (QID) | ORAL | Status: DC | PRN

## 2016-12-16 MED ORDER — DEXTROSE 5 % IV SOLN
500.0000 mg | Freq: Once | INTRAVENOUS | Status: AC
  Administered 2016-12-16: 500 mg via INTRAVENOUS
  Filled 2016-12-16: qty 500

## 2016-12-16 MED ORDER — AMLODIPINE BESYLATE 5 MG PO TABS
5.0000 mg | ORAL_TABLET | Freq: Every day | ORAL | Status: DC
  Administered 2016-12-16 – 2016-12-19 (×4): 5 mg via ORAL
  Filled 2016-12-16 (×4): qty 1

## 2016-12-16 MED ORDER — ENOXAPARIN SODIUM 40 MG/0.4ML ~~LOC~~ SOLN
40.0000 mg | SUBCUTANEOUS | Status: DC
  Administered 2016-12-16 – 2016-12-18 (×3): 40 mg via SUBCUTANEOUS
  Filled 2016-12-16 (×4): qty 0.4

## 2016-12-16 MED ORDER — CARBAMAZEPINE 100 MG PO CHEW
300.0000 mg | CHEWABLE_TABLET | Freq: Two times a day (BID) | ORAL | Status: DC
Start: 2016-12-16 — End: 2016-12-19
  Administered 2016-12-16 – 2016-12-19 (×6): 300 mg via ORAL
  Filled 2016-12-16 (×7): qty 3

## 2016-12-16 MED ORDER — METRONIDAZOLE IN NACL 5-0.79 MG/ML-% IV SOLN
500.0000 mg | Freq: Once | INTRAVENOUS | Status: AC
  Administered 2016-12-16: 500 mg via INTRAVENOUS
  Filled 2016-12-16: qty 100

## 2016-12-16 MED ORDER — DEXTROSE 5 % IV SOLN
1.0000 g | INTRAVENOUS | Status: DC
  Administered 2016-12-17 – 2016-12-18 (×2): 1 g via INTRAVENOUS
  Filled 2016-12-16 (×3): qty 10

## 2016-12-16 MED ORDER — IBUPROFEN 400 MG PO TABS
600.0000 mg | ORAL_TABLET | Freq: Once | ORAL | Status: AC
  Administered 2016-12-16: 600 mg via ORAL
  Filled 2016-12-16: qty 1

## 2016-12-16 MED ORDER — POTASSIUM CHLORIDE CRYS ER 20 MEQ PO TBCR: 40.0000 meq | EXTENDED_RELEASE_TABLET | ORAL | Status: DC

## 2016-12-16 MED ORDER — SODIUM CHLORIDE 0.9% FLUSH
3.0000 mL | Freq: Two times a day (BID) | INTRAVENOUS | Status: DC
  Administered 2016-12-16 – 2016-12-19 (×6): 3 mL via INTRAVENOUS

## 2016-12-16 MED ORDER — DEXTROSE 5 % IV SOLN
1.0000 g | Freq: Once | INTRAVENOUS | Status: AC
  Administered 2016-12-16: 1 g via INTRAVENOUS
  Filled 2016-12-16: qty 10

## 2016-12-16 MED ORDER — ONDANSETRON HCL 4 MG/2ML IJ SOLN: 4.0000 mg | Freq: Four times a day (QID) | INTRAMUSCULAR | Status: DC | PRN

## 2016-12-16 NOTE — ED Notes (Signed)
Patient transported to X-ray 

## 2016-12-16 NOTE — ED Triage Notes (Signed)
Reports having a "gran mal" seizure this morning.  Unwitnessed.  Also c/o urinary frequency and urgency.  Temp noted in triage of 100.5.  Reports running a fever on and off.

## 2016-12-16 NOTE — H&P (Signed)
History and Physical    Amanda Lozano ZOX:[id number] DOB: 1970/08/10 DOA: 12/16/2016    PCP: [name], MD  Patient coming from: home Chief Complaint: seizure  HPI: Amanda Lozano is a 46 y.o. female with medical history of seizures, HTN who presents after a seizure today. She states she has not been feeling well for a couple of weeks and has had a cough and fevers. She had a miscarriage on July 15 and a seizure after that.  She usually seizes in her sleep and feels foggy the next morning. She went to see her neurologist and no medication changes were made. She is found to have a fever and tachycardia in the ER. Extensive work up done including pelvic and renal ultrasound which show a pleural effusion. CXR ordered.  She is found to have a mod/large left pleural effusion and consolidation in the left lung.  She is not hypoxic.  Review of Systems:  All other systems reviewed and apart from HPI, are negative.  Past Medical History:  Diagnosis Date  . High cholesterol   . History of carpal tunnel syndrome   . Hypertension   . Seizures (HCC)    Last seizure "years ago"    Past Surgical History:  Procedure Laterality Date  . CESAREAN SECTION  09/11/2011   Procedure: CESAREAN SECTION;  Surgeon: Brock Bad, MD;  Location: [facility];  Service: Gynecology;  Laterality: N/A;    Social History:   reports that she has never smoked. She has never used smokeless tobacco. She reports that she does not drink alcohol or use drugs.  No Known Allergies  Family History  Problem Relation Age of Onset  . Hypertension Father   . Seizures Paternal Grandmother      Prior to Admission medications   Medication Sig Start Date End Date Taking? Authorizing Provider  amLODipine (NORVASC) 5 MG tablet Take 1 tablet (5 mg total) by mouth daily. 11/18/16  Yes Smith, [place], CNM  atenolol (TENORMIN) 100 MG tablet Take 1 tablet (100 mg total) by mouth daily. 11/18/16  Yes Smith, [place], CNM    carbamazepine (TEGRETOL) 100 MG chewable tablet CHEW AND SWALLOW 3 TABLETS BY MOUTH TWICE DAILY 05/25/16  Yes Everlena Cooper, Adam R, DO  folic acid (FOLVITE) 1 MG tablet Take 5 tablets (5 mg total) by mouth daily. Patient not taking: Reported on 12/16/2016 11/08/16   Drema Dallas, DO  gabapentin (NEURONTIN) 100 MG capsule Take 1 capsule (100 mg total) by mouth 3 (three) times daily. Patient not taking: Reported on 12/16/2016 11/27/16   Drema Dallas, DO    Physical Exam: Wt Readings from Last 3 Encounters:  12/16/16 81 kg (178 lb 8 oz)  11/27/16 90.4 kg (199 lb 3 oz)  11/18/16 92.5 kg (204 lb)   Vitals:   12/16/16 1606 12/16/16 1630 12/16/16 1745 12/16/16 1800  BP: 127/87 134/88 132/87 123/85  Pulse: (!) 117 (!) 116  (!) 117  Resp:      Temp:      TempSrc:      SpO2: 99% 98%  100%  Weight:      Height:          Constitutional: NAD, calm, comfortable Eyes: PERTLA, lids and conjunctivae normal ENMT: Mucous membranes are moist. Posterior pharynx clear of any exudate or lesions. Normal dentition.  Neck: normal, supple, no masses, no thyromegaly Respiratory: very poor air entrry into both lungs.  Cardiovascular: S1 & S2 heard, regular rate and rhythm, no murmurs / rubs /  gallops. No extremity edema. 2+ pedal pulses. No carotid bruits.  Abdomen: No distension, no tenderness, no masses palpated. No hepatosplenomegaly. Bowel sounds normal.  Musculoskeletal: no clubbing / cyanosis. No joint deformity upper and lower extremities. Good ROM, no contractures. Normal muscle tone.  Skin: no rashes, lesions, ulcers. No induration Neurologic: CN 2-12 grossly intact. Sensation intact, DTR normal. Strength 5/5 in all 4 limbs.  Psychiatric: Normal judgment and insight. Alert and oriented x 3. Normal mood.     Labs on Admission: I have personally reviewed following labs and imaging studies  CBC:  Recent Labs Lab 12/16/16 0459  WBC 9.6  HGB 9.4*  HCT 29.5*  MCV 82.6  PLT 519*   Basic Metabolic  Panel:  Recent Labs Lab 12/16/16 0459  NA 135  K 3.0*  CL 97*  CO2 24  GLUCOSE 117*  BUN 10  CREATININE 0.86  CALCIUM 8.3*   GFR: Estimated Creatinine Clearance: 82.4 mL/min (by C-G formula based on SCr of 0.86 mg/dL). Liver Function Tests: No results for input(s): AST, ALT, ALKPHOS, BILITOT, PROT, ALBUMIN in the last 168 hours. No results for input(s): LIPASE, AMYLASE in the last 168 hours. No results for input(s): AMMONIA in the last 168 hours. Coagulation Profile: No results for input(s): INR, PROTIME in the last 168 hours. Cardiac Enzymes: No results for input(s): CKTOTAL, CKMB, CKMBINDEX, TROPONINI in the last 168 hours. BNP (last 3 results) No results for input(s): PROBNP in the last 8760 hours. HbA1C: No results for input(s): HGBA1C in the last 72 hours. CBG: No results for input(s): GLUCAP in the last 168 hours. Lipid Profile: No results for input(s): CHOL, HDL, LDLCALC, TRIG, CHOLHDL, LDLDIRECT in the last 72 hours. Thyroid Function Tests: No results for input(s): TSH, T4TOTAL, FREET4, T3FREE, THYROIDAB in the last 72 hours. Anemia Panel: No results for input(s): VITAMINB12, FOLATE, FERRITIN, TIBC, IRON, RETICCTPCT in the last 72 hours. Urine analysis:    Component Value Date/Time   COLORURINE AMBER (A) 12/16/2016 0730   APPEARANCEUR HAZY (A) 12/16/2016 0730   LABSPEC 1.024 12/16/2016 0730   PHURINE 6.0 12/16/2016 0730   GLUCOSEU NEGATIVE 12/16/2016 0730   HGBUR SMALL (A) 12/16/2016 0730   BILIRUBINUR NEGATIVE 12/16/2016 0730   KETONESUR 20 (A) 12/16/2016 0730   PROTEINUR 100 (A) 12/16/2016 0730   UROBILINOGEN 0.2 03/25/2011 0940   NITRITE NEGATIVE 12/16/2016 0730   LEUKOCYTESUR NEGATIVE 12/16/2016 0730   Sepsis Labs: @LABRCNTIP (procalcitonin:4,lacticidven:4) )No results found for this or any previous visit (from the past 240 hour(s)).   Radiological Exams on Admission: Dg Chest 2 View  Result Date: 12/16/2016 CLINICAL DATA:  46 year old female with  history of fever. Lethargy. Seizure this morning. EXAM: CHEST  2 VIEW COMPARISON:  No priors. FINDINGS: Trace right and moderate to large left pleural effusion. Associated atelectasis and/or consolidation in the left lung base. No evidence of pulmonary edema. Cardiac silhouette is largely obscured. Upper mediastinal contours are within normal limits. IMPRESSION: 1. Atelectasis and/or consolidation in the left lower lobe with moderate to large left pleural effusion. Given the patient's history, findings may reflect sequela of recent aspiration. 2. Trace right pleural effusion. Electronically Signed   By: Trudie Reed M.D.   On: 12/16/2016 17:18   US Transvaginal Non-ob  Result Date: 12/16/2016 CLINICAL DATA:  Recent spontaneous abortion 11/18/2016. Persistent pelvic pain. EXAM: TRANSABDOMINAL AND TRANSVAGINAL ULTRASOUND OF PELVIS DOPPLER ULTRASOUND OF OVARIES TECHNIQUE: Both transabdominal and transvaginal ultrasound examinations of the pelvis were performed. Transabdominal technique was performed for global imaging of  the pelvis including uterus, ovaries, adnexal regions, and pelvic cul-de-sac. It was necessary to proceed with endovaginal exam following the transabdominal exam to visualize the ovaries and endometrium. Color and duplex Doppler ultrasound was utilized to evaluate blood flow to the ovaries. COMPARISON:  11/18/2016 FINDINGS: Uterus Measurements: 7.8 x 3.4 x 4.3 cm. No fibroids or other mass visualized. Small nabothian cysts are noted near the cervix. Endometrium Thickness: 10.0 mm.  No findings for retained products or mass. Right ovary Measurements: 2.6 x 1.7 x 1.8 cm. Normal appearance/no adnexal mass. Left ovary Measurements: 2.0 x 1.4 x 1.7 cm. Normal appearance/no adnexal mass. Pulsed Doppler evaluation of both ovaries demonstrates normal low-resistance arterial and venous waveforms. Other findings No abnormal free fluid. There is a small left-sided paraovarian cyst measuring 1.3 x 0.6 x  0.8 cm. IMPRESSION: 1. Unremarkable sonographic appearance of the uterus and ovaries. 2. Small left paraovarian cyst. Electronically Signed   By: Rudie Meyer M.D.   On: 12/16/2016 15:58   US Pelvis Complete  Result Date: 12/16/2016 CLINICAL DATA:  Recent spontaneous abortion 11/18/2016. Persistent pelvic pain. EXAM: TRANSABDOMINAL AND TRANSVAGINAL ULTRASOUND OF PELVIS DOPPLER ULTRASOUND OF OVARIES TECHNIQUE: Both transabdominal and transvaginal ultrasound examinations of the pelvis were performed. Transabdominal technique was performed for global imaging of the pelvis including uterus, ovaries, adnexal regions, and pelvic cul-de-sac. It was necessary to proceed with endovaginal exam following the transabdominal exam to visualize the ovaries and endometrium. Color and duplex Doppler ultrasound was utilized to evaluate blood flow to the ovaries. COMPARISON:  11/18/2016 FINDINGS: Uterus Measurements: 7.8 x 3.4 x 4.3 cm. No fibroids or other mass visualized. Small nabothian cysts are noted near the cervix. Endometrium Thickness: 10.0 mm.  No findings for retained products or mass. Right ovary Measurements: 2.6 x 1.7 x 1.8 cm. Normal appearance/no adnexal mass. Left ovary Measurements: 2.0 x 1.4 x 1.7 cm. Normal appearance/no adnexal mass. Pulsed Doppler evaluation of both ovaries demonstrates normal low-resistance arterial and venous waveforms. Other findings No abnormal free fluid. There is a small left-sided paraovarian cyst measuring 1.3 x 0.6 x 0.8 cm. IMPRESSION: 1. Unremarkable sonographic appearance of the uterus and ovaries. 2. Small left paraovarian cyst. Electronically Signed   By: Rudie Meyer M.D.   On: 12/16/2016 15:58   US Renal  Result Date: 12/16/2016 CLINICAL DATA:  Bilateral back and flank pain since 11/18/2016. EXAM: RENAL / URINARY TRACT ULTRASOUND COMPLETE COMPARISON:  None. FINDINGS: Right Kidney: Length: 10.8 cm.  Mildly echogenic.  No hydronephrosis. Left Kidney: Length: 10.1 cm.  Echogenicity within normal limits. No mass or hydronephrosis visualized. Bladder: Not visualized. Other:  Moderate size left pleural effusion. IMPRESSION: 1. No hydronephrosis. 2. Moderate-sized left pleural effusion. 3. Mildly echogenic right kidney, compatible with medical renal disease. Electronically Signed   By: Beckie Salts M.D.   On: 12/16/2016 16:02   [place] Art/ven Flow Abd Pelv Doppler  Result Date: 12/16/2016 CLINICAL DATA:  Recent spontaneous abortion 11/18/2016. Persistent pelvic pain. EXAM: TRANSABDOMINAL AND TRANSVAGINAL ULTRASOUND OF PELVIS DOPPLER ULTRASOUND OF OVARIES TECHNIQUE: Both transabdominal and transvaginal ultrasound examinations of the pelvis were performed. Transabdominal technique was performed for global imaging of the pelvis including uterus, ovaries, adnexal regions, and pelvic cul-de-sac. It was necessary to proceed with endovaginal exam following the transabdominal exam to visualize the ovaries and endometrium. Color and duplex Doppler ultrasound was utilized to evaluate blood flow to the ovaries. COMPARISON:  11/18/2016 FINDINGS: Uterus Measurements: 7.8 x 3.4 x 4.3 cm. No fibroids or other mass visualized. Small nabothian  cysts are noted near the cervix. Endometrium Thickness: 10.0 mm.  No findings for retained products or mass. Right ovary Measurements: 2.6 x 1.7 x 1.8 cm. Normal appearance/no adnexal mass. Left ovary Measurements: 2.0 x 1.4 x 1.7 cm. Normal appearance/no adnexal mass. Pulsed Doppler evaluation of both ovaries demonstrates normal low-resistance arterial and venous waveforms. Other findings No abnormal free fluid. There is a small left-sided paraovarian cyst measuring 1.3 x 0.6 x 0.8 cm. IMPRESSION: 1. Unremarkable sonographic appearance of the uterus and ovaries. 2. Small left paraovarian cyst. Electronically Signed   By: Rudie MeyerP.  Gallerani M.D.   On: 12/16/2016 15:58       Assessment/Plan Active Problems:   Convulsions/seizures (HCC) - cont Carbamezapine- Dr  Everlena CooperJaffe suspects her last seizure was from the stress of her miscarriage.  - Dr Moises BloodJaffe's last note from July mentions that he wanted a baseline MRI- will order this    LLL pneumonia / Pleural effusion Sepsis with fever 101, tachycardia and pneumonia - Rocephin, Zithromax and Flagyl in case she aspirated with her last seizure - U strep antigen - thoracentesis to ensure she does not have an empyema  Recent Miscarriage - U preg neg- pelvic ultrasound unrevealing  DVT prophylaxis: lovenox  Code Status: full code  Family Communication:   Disposition Plan: home when stable  Consults called: none  Admission status: admit    Calvert CantorSaima Carreen Milius MD [facility] Pager: [email or web address] Password [email or web address] 7PM-7AM, please contact night-coverage   12/16/2016, 6:19 PM

## 2016-12-16 NOTE — ED Notes (Signed)
Patient transported to Ultrasound 

## 2016-12-16 NOTE — ED Notes (Signed)
ED Provider at bedside. 

## 2016-12-16 NOTE — ED Provider Notes (Signed)
[facility]-EMERGENCY DEPT Provider Note   CSN: [id number] Arrival date & time: 12/16/16  0444     History   Chief Complaint Chief Complaint  Patient presents with  . Seizures  . Urinary Frequency    HPI Amanda Lozano is a 46 y.o. female.  HPI Patient presents to the emergency department with a seizure and urinary frequency and back pain.  The patient states that nothing seems make the condition better or worse.  Patient states that she awoke confused and felt like she had a seizure.  Patient, states she has also had some urinary frequency back pain.  Patient states that she had a miscarriage about one month ago.  She states that she was seen at [facility] hospital and had an ultrasound performed at that time.  She states that they did not see any abnormalities. The patient denies chest pain, shortness of breath, headache,blurred vision, neck pain, fever, cough, weakness, numbness, dizziness, anorexia, edema, abdominal pain,  vomiting, diarrhea, rash, back pain, dysuria, hematemesis, bloody stool, near syncope, or syncope. Past Medical History:  Diagnosis Date  . High cholesterol   . History of carpal tunnel syndrome   . Hypertension   . Seizures (HCC)    Last seizure "years ago"    Patient Active Problem List   Diagnosis Date Noted  . 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 Depression 11/17/2013  . Staring spell 08/04/2013  . Convulsions/seizures (HCC) 11/10/2012  . Elderly multigravida with antepartum condition or complication 03/15/2011    Past Surgical History:  Procedure Laterality Date  . CESAREAN SECTION  09/11/2011   Procedure: CESAREAN SECTION;  Surgeon: Brock Bad, MD;  Location: [facility];  Service: Gynecology;  Laterality: N/A;    OB History    Gravida Para Term Preterm AB Living   6 4 4  0 1 4   SAB TAB Ectopic Multiple Live Births   1 0 0 0 4      Obstetric Comments   Previous CS for breech        Home Medications    Prior to Admission medications   Medication Sig Start Date End Date Taking? Authorizing Provider  amLODipine (NORVASC) 5 MG tablet Take 1 tablet (5 mg total) by mouth daily. 11/18/16  Yes Smith, [place], CNM  atenolol (TENORMIN) 100 MG tablet Take 1 tablet (100 mg total) by mouth daily. 11/18/16  Yes Smith, [place], CNM  carbamazepine (TEGRETOL) 100 MG chewable tablet CHEW AND SWALLOW 3 TABLETS BY MOUTH TWICE DAILY 05/25/16  Yes Everlena Cooper, Adam R, DO  folic acid (FOLVITE) 1 MG tablet Take 5 tablets (5 mg total) by mouth daily. Patient not taking: Reported on 12/16/2016 11/08/16   Drema Dallas, DO  gabapentin (NEURONTIN) 100 MG capsule Take 1 capsule (100 mg total) by mouth 3 (three) times daily. Patient not taking: Reported on 12/16/2016 11/27/16   Drema Dallas, DO    Family History Family History  Problem Relation Age of Onset  . Hypertension Father   . Seizures Paternal Grandmother     Social History Social History  Substance Use Topics  . Smoking status: Never Smoker  . Smokeless tobacco: Never Used  . Alcohol use No     Allergies   Patient has no known allergies.   Review of Systems Review of Systems  All other systems negative except as documented in the HPI. All pertinent positives and negatives as reviewed in the HPI. Physical Exam Updated Vital Signs BP  134/88   Pulse (!) 116   Temp (S) 99.6 F (37.6 C) (Oral)   Resp 20   Ht 5\' 2"  (1.575 m)   Wt 81 kg (178 lb 8 oz)   LMP 11/26/2016 (Approximate)   SpO2 98%   BMI 32.65 kg/m   Physical Exam  Constitutional: She is oriented to person, place, and time. She appears well-developed and well-nourished. No distress.  HENT:  Head: Normocephalic and atraumatic.  Mouth/Throat: Oropharynx is clear and moist.  Eyes: Pupils are equal, round, and reactive to light.  Neck: Normal range of motion. Neck supple.  Cardiovascular: Normal rate, regular rhythm and normal heart sounds.  Exam reveals no  gallop and no friction rub.   No murmur heard. Pulmonary/Chest: Effort normal. No respiratory distress. She has decreased breath sounds in the left middle field and the left lower field. She has no wheezes.  Abdominal: Soft. Bowel sounds are normal. She exhibits no distension. There is no tenderness.  Neurological: She is alert and oriented to person, place, and time. No sensory deficit. She exhibits normal muscle tone. Coordination normal.  Skin: Skin is warm and dry. Capillary refill takes less than 2 seconds. No rash noted. No erythema.  Psychiatric: She has a normal mood and affect. Her behavior is normal.  Nursing note and vitals reviewed.    ED Treatments / Results  Labs (all labs ordered are listed, but only abnormal results are displayed) Labs Reviewed  BASIC METABOLIC PANEL - Abnormal; Notable for the following:       Result Value   Potassium 3.0 (*)    Chloride 97 (*)    Glucose, Bld 117 (*)    Calcium 8.3 (*)    All other components within normal limits  CBC - Abnormal; Notable for the following:    RBC 3.57 (*)    Hemoglobin 9.4 (*)    HCT 29.5 (*)    Platelets 519 (*)    All other components within normal limits  URINALYSIS, ROUTINE W REFLEX MICROSCOPIC - Abnormal; Notable for the following:    Color, Urine AMBER (*)    APPearance HAZY (*)    Hgb urine dipstick SMALL (*)    Ketones, ur 20 (*)    Protein, ur 100 (*)    Squamous Epithelial / LPF 0-5 (*)    All other components within normal limits  CULTURE, BLOOD (ROUTINE X 2)  CULTURE, BLOOD (ROUTINE X 2)  URINE CULTURE  CARBAMAZEPINE LEVEL, TOTAL  PREGNANCY, URINE  I-STAT BETA HCG BLOOD, ED ([facility], [facility], [facility] ONLY)  I-STAT CG4 LACTIC ACID, ED  I-STAT CG4 LACTIC ACID, ED  CBG MONITORING, ED    EKG  EKG Interpretation None       Radiology US Transvaginal Non-ob  Result Date: 12/16/2016 CLINICAL DATA:  Recent spontaneous abortion 11/18/2016. Persistent pelvic pain. EXAM: TRANSABDOMINAL AND TRANSVAGINAL  ULTRASOUND OF PELVIS DOPPLER ULTRASOUND OF OVARIES TECHNIQUE: Both transabdominal and transvaginal ultrasound examinations of the pelvis were performed. Transabdominal technique was performed for global imaging of the pelvis including uterus, ovaries, adnexal regions, and pelvic cul-de-sac. It was necessary to proceed with endovaginal exam following the transabdominal exam to visualize the ovaries and endometrium. Color and duplex Doppler ultrasound was utilized to evaluate blood flow to the ovaries. COMPARISON:  11/18/2016 FINDINGS: Uterus Measurements: 7.8 x 3.4 x 4.3 cm. No fibroids or other mass visualized. Small nabothian cysts are noted near the cervix. Endometrium Thickness: 10.0 mm.  No findings for retained products or mass. Right ovary  Measurements: 2.6 x 1.7 x 1.8 cm. Normal appearance/no adnexal mass. Left ovary Measurements: 2.0 x 1.4 x 1.7 cm. Normal appearance/no adnexal mass. Pulsed Doppler evaluation of both ovaries demonstrates normal low-resistance arterial and venous waveforms. Other findings No abnormal free fluid. There is a small left-sided paraovarian cyst measuring 1.3 x 0.6 x 0.8 cm. IMPRESSION: 1. Unremarkable sonographic appearance of the uterus and ovaries. 2. Small left paraovarian cyst. Electronically Signed   By: Rudie MeyerP.  Gallerani M.D.   On: 12/16/2016 15:58   [place] Pelvis Complete  Result Date: 12/16/2016 CLINICAL DATA:  Recent spontaneous abortion 11/18/2016. Persistent pelvic pain. EXAM: TRANSABDOMINAL AND TRANSVAGINAL ULTRASOUND OF PELVIS DOPPLER ULTRASOUND OF OVARIES TECHNIQUE: Both transabdominal and transvaginal ultrasound examinations of the pelvis were performed. Transabdominal technique was performed for global imaging of the pelvis including uterus, ovaries, adnexal regions, and pelvic cul-de-sac. It was necessary to proceed with endovaginal exam following the transabdominal exam to visualize the ovaries and endometrium. Color and duplex Doppler ultrasound was utilized to  evaluate blood flow to the ovaries. COMPARISON:  11/18/2016 FINDINGS: Uterus Measurements: 7.8 x 3.4 x 4.3 cm. No fibroids or other mass visualized. Small nabothian cysts are noted near the cervix. Endometrium Thickness: 10.0 mm.  No findings for retained products or mass. Right ovary Measurements: 2.6 x 1.7 x 1.8 cm. Normal appearance/no adnexal mass. Left ovary Measurements: 2.0 x 1.4 x 1.7 cm. Normal appearance/no adnexal mass. Pulsed Doppler evaluation of both ovaries demonstrates normal low-resistance arterial and venous waveforms. Other findings No abnormal free fluid. There is a small left-sided paraovarian cyst measuring 1.3 x 0.6 x 0.8 cm. IMPRESSION: 1. Unremarkable sonographic appearance of the uterus and ovaries. 2. Small left paraovarian cyst. Electronically Signed   By: Rudie MeyerP.  Gallerani M.D.   On: 12/16/2016 15:58   [place] Renal  Result Date: 12/16/2016 CLINICAL DATA:  Bilateral back and flank pain since 11/18/2016. EXAM: RENAL / URINARY TRACT ULTRASOUND COMPLETE COMPARISON:  None. FINDINGS: Right Kidney: Length: 10.8 cm.  Mildly echogenic.  No hydronephrosis. Left Kidney: Length: 10.1 cm. Echogenicity within normal limits. No mass or hydronephrosis visualized. Bladder: Not visualized. Other:  Moderate size left pleural effusion. IMPRESSION: 1. No hydronephrosis. 2. Moderate-sized left pleural effusion. 3. Mildly echogenic right kidney, compatible with medical renal disease. Electronically Signed   By: Beckie SaltsSteven  Reid M.D.   On: 12/16/2016 16:02   [place] Art/ven Flow Abd Pelv Doppler  Result Date: 12/16/2016 CLINICAL DATA:  Recent spontaneous abortion 11/18/2016. Persistent pelvic pain. EXAM: TRANSABDOMINAL AND TRANSVAGINAL ULTRASOUND OF PELVIS DOPPLER ULTRASOUND OF OVARIES TECHNIQUE: Both transabdominal and transvaginal ultrasound examinations of the pelvis were performed. Transabdominal technique was performed for global imaging of the pelvis including uterus, ovaries, adnexal regions, and pelvic  cul-de-sac. It was necessary to proceed with endovaginal exam following the transabdominal exam to visualize the ovaries and endometrium. Color and duplex Doppler ultrasound was utilized to evaluate blood flow to the ovaries. COMPARISON:  11/18/2016 FINDINGS: Uterus Measurements: 7.8 x 3.4 x 4.3 cm. No fibroids or other mass visualized. Small nabothian cysts are noted near the cervix. Endometrium Thickness: 10.0 mm.  No findings for retained products or mass. Right ovary Measurements: 2.6 x 1.7 x 1.8 cm. Normal appearance/no adnexal mass. Left ovary Measurements: 2.0 x 1.4 x 1.7 cm. Normal appearance/no adnexal mass. Pulsed Doppler evaluation of both ovaries demonstrates normal low-resistance arterial and venous waveforms. Other findings No abnormal free fluid. There is a small left-sided paraovarian cyst measuring 1.3 x 0.6 x 0.8 cm. IMPRESSION: 1. Unremarkable sonographic appearance  of the uterus and ovaries. 2. Small left paraovarian cyst. Electronically Signed   By: Rudie Meyer M.D.   On: 12/16/2016 15:58    Procedures Procedures (including critical care time)  Medications Ordered in ED Medications  sodium chloride 0.9 % bolus 1,000 mL (0 mLs Intravenous Stopped 12/16/16 1111)  sodium chloride 0.9 % bolus 1,000 mL (0 mLs Intravenous Stopped 12/16/16 1450)  ondansetron (ZOFRAN) injection 4 mg (4 mg Intravenous Given 12/16/16 1113)  acetaminophen (TYLENOL) tablet 1,000 mg (1,000 mg Oral Given 12/16/16 1113)  ibuprofen (ADVIL,MOTRIN) tablet 600 mg (600 mg Oral Given 12/16/16 1113)  potassium chloride SA (K-DUR,KLOR-CON) CR tablet 40 mEq (40 mEq Oral Given 12/16/16 1421)     Initial Impression / Assessment and Plan / ED Course  I have reviewed the triage vital signs and the nursing notes.  Pertinent labs & imaging results that were available during my care of the patient were reviewed by me and considered in my medical decision making (see chart for details).     He should have protracted ER  visit the fact she is been tachycardic and attempt to improve this with fluids.  Patient has not had any complaints of cough.  Does have fever.  Patient remains persistently tachycardic despite fluids.  She has a fairly large consolidation and looks like pneumonia.  The patient will need admission to the hospital  Final Clinical Impressions(s) / ED Diagnoses   Final diagnoses:  None    New Prescriptions New Prescriptions   No medications on file     Charlestine Night, Cordelia Poche 12/16/16 Tonna Boehringer, MD 12/18/16 1240

## 2016-12-17 ENCOUNTER — Inpatient Hospital Stay (HOSPITAL_COMMUNITY): Payer: Medicaid Other

## 2016-12-17 ENCOUNTER — Telehealth: Payer: Self-pay | Admitting: Neurology

## 2016-12-17 DIAGNOSIS — A419 Sepsis, unspecified organism: Secondary | ICD-10-CM

## 2016-12-17 DIAGNOSIS — I10 Essential (primary) hypertension: Secondary | ICD-10-CM

## 2016-12-17 LAB — BASIC METABOLIC PANEL
ANION GAP: 9 (ref 5–15)
BUN: 7 mg/dL (ref 6–20)
CALCIUM: 8.3 mg/dL — AB (ref 8.9–10.3)
CO2: 24 mmol/L (ref 22–32)
Chloride: 104 mmol/L (ref 101–111)
Creatinine, Ser: 0.69 mg/dL (ref 0.44–1.00)
GFR calc Af Amer: >60 mL/min (ref >60)
GLUCOSE: 106 mg/dL — AB (ref 65–99)
POTASSIUM: 3.7 mmol/L (ref 3.5–5.1)
SODIUM: 137 mmol/L (ref 135–145)

## 2016-12-17 LAB — PROTEIN, TOTAL: Total Protein: 8 g/dL (ref 6.5–8.1)

## 2016-12-17 LAB — CBC
HCT: 28.8 % — ABNORMAL LOW (ref 36.0–46.0)
HEMOGLOBIN: 9 g/dL — AB (ref 12.0–15.0)
MCH: 26.1 pg (ref 26.0–34.0)
MCHC: 31.3 g/dL (ref 30.0–36.0)
MCV: 83.5 fL (ref 78.0–100.0)
PLATELETS: 493 10*3/uL — AB (ref 150–400)
RBC: 3.45 MIL/uL — ABNORMAL LOW (ref 3.87–5.11)
RDW: 15 % (ref 11.5–15.5)
WBC: 6.3 10*3/uL (ref 4.0–10.5)

## 2016-12-17 LAB — CARBAMAZEPINE LEVEL, TOTAL: Carbamazepine Lvl: 8.8 ug/mL (ref 4.0–12.0)

## 2016-12-17 LAB — MAGNESIUM: Magnesium: 1.6 mg/dL — ABNORMAL LOW (ref 1.7–2.4)

## 2016-12-17 LAB — LACTATE DEHYDROGENASE: LDH: 274 U/L — ABNORMAL HIGH (ref 98–192)

## 2016-12-17 LAB — URINE CULTURE

## 2016-12-17 LAB — STREP PNEUMONIAE URINARY ANTIGEN: STREP PNEUMO URINARY ANTIGEN: POSITIVE — AB

## 2016-12-17 MED ORDER — MAGNESIUM SULFATE 2 GM/50ML IV SOLN
2.0000 g | Freq: Once | INTRAVENOUS | Status: AC
  Administered 2016-12-17: 2 g via INTRAVENOUS
  Filled 2016-12-17: qty 50

## 2016-12-17 NOTE — Telephone Encounter (Signed)
Please advise 

## 2016-12-17 NOTE — Telephone Encounter (Signed)
Doctor Rizlon (could not hear name clearly on voicemail) called regarding this patient. She is in the hospital now from having a Seizure yesterday. The Doctor would like to speak with Dr. Everlena CooperJaffe regarding making adjustments to this patient's medication. Please call. Thanks

## 2016-12-17 NOTE — Telephone Encounter (Signed)
Spoke to Dr. Butler Denmarkizwan, on further questioning with patient, it appears complaint was more of pain and may not have had a seizure. Agreed to continue on current dose of carbamazepine, (level 8.8). F/u with Dr. Everlena CooperJaffe as scheduled.

## 2016-12-17 NOTE — Progress Notes (Addendum)
PROGRESS NOTE    Amanda Lozano   UEA:[id number]  DOB: 11-26-1970  DOA: 12/16/2016 PCP: [name], MD   Brief Narrative:   Amanda Lozano is a 46 y.o. female with medical history of seizures, HTN who presents after a suspected seizure. She states she has not been feeling well for a couple of weeks and has had a cough and fevers. She had a miscarriage on July 15 and a seizure after that.  She knows when she has a seizure as she usually seizes in her sleep and feels foggy and sore in the morning. She went to see her neurologist and no medication changes were made. She is found to have a fever of 101 and tachycardia in the ER. Extensive work up done including pelvic and renal ultrasound which show a pleural effusion. CXR ordered.  She is found to have a mod/large left pleural effusion and consolidation in the left lung.  She is not hypoxic.  Subjective: Tells me today that she typically has increased somonlence and body aches after a seizure and is not having these today. She therefore suspects that she did not have a seizure. No chest pain. Cough a little better. Afebrile.  ROS: no complaints of nausea, vomiting, constipation diarrhea or dysuria. No other complaints.   Assessment & Plan:   Principal Problem:   LLL pneumonia / Pleural effusion     Sepsis  - Rocephin/ Zithromax- strep pneumo U antigen + - will d/c zithromax - thoracentesis ordered - fevers resolved but still tachycardic - blood cultures negative  Active Problems:   Convulsions/seizures (HCC)   - initially she thought she may have had a seizure in her sleep but then states she probably hasn't as she did not have the typical post ictal symptoms - tegretol level normal    Benign essential HTN Norvasc, HTN  Recent Miscarriage - U preg neg- pelvic ultrasound unrevealing  DVT prophylaxis: lovenox Code Status: Full code Family Communication:  Disposition Plan: home in 1-2 days Consultants:    Procedures:     Antimicrobials:  Anti-infectives    Start     Dose/Rate Route Frequency Ordered Stop   12/17/16 1800  cefTRIAXone (ROCEPHIN) 1 g in dextrose 5 % 50 mL IVPB     1 g 100 mL/hr over 30 Minutes Intravenous Every 24 hours 12/16/16 1748 12/24/16 1759   12/17/16 1800  azithromycin (ZITHROMAX) tablet 500 mg     500 mg Oral Every 24 hours 12/16/16 1748 12/24/16 1759   12/16/16 1730  metroNIDAZOLE (FLAGYL) IVPB 500 mg     500 mg 100 mL/hr over 60 Minutes Intravenous  Once 12/16/16 1727 12/16/16 2155   12/16/16 1715  cefTRIAXone (ROCEPHIN) 1 g in dextrose 5 % 50 mL IVPB     1 g 100 mL/hr over 30 Minutes Intravenous  Once 12/16/16 1714 12/16/16 1856   12/16/16 1715  azithromycin (ZITHROMAX) 500 mg in dextrose 5 % 250 mL IVPB     500 mg 250 mL/hr over 60 Minutes Intravenous  Once 12/16/16 1714 12/16/16 2255       Objective: Vitals:   12/16/16 1858 12/16/16 2039 12/16/16 2228 12/17/16 0454  BP:  (!) 141/82 115/81 129/86  Pulse: (!) 115 (!) 117 95 (!) 109  Resp: 18  18 16   Temp: 98.4 F (36.9 C)  98.6 F (37 C) 98.8 F (37.1 C)  TempSrc: Oral   Oral  SpO2: 100%  100% 97%  Weight:      Height:  Intake/Output Summary (Last 24 hours) at 12/17/16 1205 Last data filed at 12/17/16 0819  Gross per 24 hour  Intake              520 ml  Output              501 ml  Net               19 ml   Filed Weights   12/16/16 0456  Weight: 81 kg (178 lb 8 oz)    Examination: General exam: Appears comfortable  HEENT: PERRLA, oral mucosa moist, no sclera icterus or thrush Respiratory system: decreased breath sounds in left lung field Respiratory effort normal. Cardiovascular system: S1 & S2 heard, RRR.  No murmurs  Gastrointestinal system: Abdomen soft, non-tender, nondistended. Normal bowel sound. No organomegaly Central nervous system: Alert and oriented. No focal neurological deficits. Extremities: No cyanosis, clubbing or edema Skin: No rashes or ulcers Psychiatry:  Mood & affect  appropriate.     Data Reviewed: I have personally reviewed following labs and imaging studies  CBC:  Recent Labs Lab 12/16/16 0459 12/17/16 0445  WBC 9.6 6.3  HGB 9.4* 9.0*  HCT 29.5* 28.8*  MCV 82.6 83.5  PLT 519* 493*   Basic Metabolic Panel:  Recent Labs Lab 12/16/16 0459 12/17/16 0445  NA 135 137  K 3.0* 3.7  CL 97* 104  CO2 24 24  GLUCOSE 117* 106*  BUN 10 7  CREATININE 0.86 0.69  CALCIUM 8.3* 8.3*  MG  --  1.6*   GFR: Estimated Creatinine Clearance: 88.5 mL/min (by C-G formula based on SCr of 0.69 mg/dL). Liver Function Tests:  Recent Labs Lab 12/17/16 0445  PROT 8.0   No results for input(s): LIPASE, AMYLASE in the last 168 hours. No results for input(s): AMMONIA in the last 168 hours. Coagulation Profile: No results for input(s): INR, PROTIME in the last 168 hours. Cardiac Enzymes: No results for input(s): CKTOTAL, CKMB, CKMBINDEX, TROPONINI in the last 168 hours. BNP (last 3 results) No results for input(s): PROBNP in the last 8760 hours. HbA1C: No results for input(s): HGBA1C in the last 72 hours. CBG: No results for input(s): GLUCAP in the last 168 hours. Lipid Profile: No results for input(s): CHOL, HDL, LDLCALC, TRIG, CHOLHDL, LDLDIRECT in the last 72 hours. Thyroid Function Tests: No results for input(s): TSH, T4TOTAL, FREET4, T3FREE, THYROIDAB in the last 72 hours. Anemia Panel: No results for input(s): VITAMINB12, FOLATE, FERRITIN, TIBC, IRON, RETICCTPCT in the last 72 hours. Urine analysis:    Component Value Date/Time   COLORURINE AMBER (A) 12/16/2016 0730   APPEARANCEUR HAZY (A) 12/16/2016 0730   LABSPEC 1.024 12/16/2016 0730   PHURINE 6.0 12/16/2016 0730   GLUCOSEU NEGATIVE 12/16/2016 0730   HGBUR SMALL (A) 12/16/2016 0730   BILIRUBINUR NEGATIVE 12/16/2016 0730   KETONESUR 20 (A) 12/16/2016 0730   PROTEINUR 100 (A) 12/16/2016 0730   UROBILINOGEN 0.2 03/25/2011 0940   NITRITE NEGATIVE 12/16/2016 0730   LEUKOCYTESUR NEGATIVE  12/16/2016 0730   Sepsis Labs: @LABRCNTIP (procalcitonin:4,lacticidven:4) ) Recent Results (from the past 240 hour(s))  Urine culture     Status: Abnormal   Collection Time: 12/16/16  7:30 AM  Result Value Ref Range Status   Specimen Description URINE, CLEAN CATCH  Final   Special Requests NONE  Final   Culture MULTIPLE SPECIES PRESENT, SUGGEST RECOLLECTION (A)  Final   Report Status 12/17/2016 FINAL  Final         Radiology Studies: Dg Chest  2 View  Result Date: 12/16/2016 CLINICAL DATA:  46 year old female with history of fever. Lethargy. Seizure this morning. EXAM: CHEST  2 VIEW COMPARISON:  No priors. FINDINGS: Trace right and moderate to large left pleural effusion. Associated atelectasis and/or consolidation in the left lung base. No evidence of pulmonary edema. Cardiac silhouette is largely obscured. Upper mediastinal contours are within normal limits. IMPRESSION: 1. Atelectasis and/or consolidation in the left lower lobe with moderate to large left pleural effusion. Given the patient's history, findings may reflect sequela of recent aspiration. 2. Trace right pleural effusion. Electronically Signed   By: Trudie Reed M.D.   On: 12/16/2016 17:18   US Transvaginal Non-ob  Result Date: 12/16/2016 CLINICAL DATA:  Recent spontaneous abortion 11/18/2016. Persistent pelvic pain. EXAM: TRANSABDOMINAL AND TRANSVAGINAL ULTRASOUND OF PELVIS DOPPLER ULTRASOUND OF OVARIES TECHNIQUE: Both transabdominal and transvaginal ultrasound examinations of the pelvis were performed. Transabdominal technique was performed for global imaging of the pelvis including uterus, ovaries, adnexal regions, and pelvic cul-de-sac. It was necessary to proceed with endovaginal exam following the transabdominal exam to visualize the ovaries and endometrium. Color and duplex Doppler ultrasound was utilized to evaluate blood flow to the ovaries. COMPARISON:  11/18/2016 FINDINGS: Uterus Measurements: 7.8 x 3.4 x 4.3 cm.  No fibroids or other mass visualized. Small nabothian cysts are noted near the cervix. Endometrium Thickness: 10.0 mm.  No findings for retained products or mass. Right ovary Measurements: 2.6 x 1.7 x 1.8 cm. Normal appearance/no adnexal mass. Left ovary Measurements: 2.0 x 1.4 x 1.7 cm. Normal appearance/no adnexal mass. Pulsed Doppler evaluation of both ovaries demonstrates normal low-resistance arterial and venous waveforms. Other findings No abnormal free fluid. There is a small left-sided paraovarian cyst measuring 1.3 x 0.6 x 0.8 cm. IMPRESSION: 1. Unremarkable sonographic appearance of the uterus and ovaries. 2. Small left paraovarian cyst. Electronically Signed   By: Rudie Meyer M.D.   On: 12/16/2016 15:58   US Pelvis Complete  Result Date: 12/16/2016 CLINICAL DATA:  Recent spontaneous abortion 11/18/2016. Persistent pelvic pain. EXAM: TRANSABDOMINAL AND TRANSVAGINAL ULTRASOUND OF PELVIS DOPPLER ULTRASOUND OF OVARIES TECHNIQUE: Both transabdominal and transvaginal ultrasound examinations of the pelvis were performed. Transabdominal technique was performed for global imaging of the pelvis including uterus, ovaries, adnexal regions, and pelvic cul-de-sac. It was necessary to proceed with endovaginal exam following the transabdominal exam to visualize the ovaries and endometrium. Color and duplex Doppler ultrasound was utilized to evaluate blood flow to the ovaries. COMPARISON:  11/18/2016 FINDINGS: Uterus Measurements: 7.8 x 3.4 x 4.3 cm. No fibroids or other mass visualized. Small nabothian cysts are noted near the cervix. Endometrium Thickness: 10.0 mm.  No findings for retained products or mass. Right ovary Measurements: 2.6 x 1.7 x 1.8 cm. Normal appearance/no adnexal mass. Left ovary Measurements: 2.0 x 1.4 x 1.7 cm. Normal appearance/no adnexal mass. Pulsed Doppler evaluation of both ovaries demonstrates normal low-resistance arterial and venous waveforms. Other findings No abnormal free fluid.  There is a small left-sided paraovarian cyst measuring 1.3 x 0.6 x 0.8 cm. IMPRESSION: 1. Unremarkable sonographic appearance of the uterus and ovaries. 2. Small left paraovarian cyst. Electronically Signed   By: Rudie Meyer M.D.   On: 12/16/2016 15:58   US Renal  Result Date: 12/16/2016 CLINICAL DATA:  Bilateral back and flank pain since 11/18/2016. EXAM: RENAL / URINARY TRACT ULTRASOUND COMPLETE COMPARISON:  None. FINDINGS: Right Kidney: Length: 10.8 cm.  Mildly echogenic.  No hydronephrosis. Left Kidney: Length: 10.1 cm. Echogenicity within normal limits. No mass  or hydronephrosis visualized. Bladder: Not visualized. Other:  Moderate size left pleural effusion. IMPRESSION: 1. No hydronephrosis. 2. Moderate-sized left pleural effusion. 3. Mildly echogenic right kidney, compatible with medical renal disease. Electronically Signed   By: Beckie Salts M.D.   On: 12/16/2016 16:02   [place] Art/ven Flow Abd Pelv Doppler  Result Date: 12/16/2016 CLINICAL DATA:  Recent spontaneous abortion 11/18/2016. Persistent pelvic pain. EXAM: TRANSABDOMINAL AND TRANSVAGINAL ULTRASOUND OF PELVIS DOPPLER ULTRASOUND OF OVARIES TECHNIQUE: Both transabdominal and transvaginal ultrasound examinations of the pelvis were performed. Transabdominal technique was performed for global imaging of the pelvis including uterus, ovaries, adnexal regions, and pelvic cul-de-sac. It was necessary to proceed with endovaginal exam following the transabdominal exam to visualize the ovaries and endometrium. Color and duplex Doppler ultrasound was utilized to evaluate blood flow to the ovaries. COMPARISON:  11/18/2016 FINDINGS: Uterus Measurements: 7.8 x 3.4 x 4.3 cm. No fibroids or other mass visualized. Small nabothian cysts are noted near the cervix. Endometrium Thickness: 10.0 mm.  No findings for retained products or mass. Right ovary Measurements: 2.6 x 1.7 x 1.8 cm. Normal appearance/no adnexal mass. Left ovary Measurements: 2.0 x 1.4 x 1.7 cm.  Normal appearance/no adnexal mass. Pulsed Doppler evaluation of both ovaries demonstrates normal low-resistance arterial and venous waveforms. Other findings No abnormal free fluid. There is a small left-sided paraovarian cyst measuring 1.3 x 0.6 x 0.8 cm. IMPRESSION: 1. Unremarkable sonographic appearance of the uterus and ovaries. 2. Small left paraovarian cyst. Electronically Signed   By: Rudie Meyer M.D.   On: 12/16/2016 15:58      Scheduled Meds: . amLODipine  5 mg Oral Daily  . atenolol  100 mg Oral Daily  . azithromycin  500 mg Oral Q24H  . carbamazepine  300 mg Oral BID WC  . enoxaparin (LOVENOX) injection  40 mg Subcutaneous Q24H  . sodium chloride flush  3 mL Intravenous Q12H   Continuous Infusions: . sodium chloride    . cefTRIAXone (ROCEPHIN)  IV    . magnesium sulfate 1 - 4 g bolus IVPB       LOS: 1 day    Time spent in minutes: 35    Calvert Cantor, MD [facility] Pager: [email or web address] Password [facility] 12/17/2016, 12:05 PM

## 2016-12-18 ENCOUNTER — Inpatient Hospital Stay (HOSPITAL_COMMUNITY): Payer: Medicaid Other

## 2016-12-18 ENCOUNTER — Encounter (HOSPITAL_COMMUNITY): Payer: Self-pay | Admitting: General Surgery

## 2016-12-18 DIAGNOSIS — J13 Pneumonia due to Streptococcus pneumoniae: Secondary | ICD-10-CM

## 2016-12-18 DIAGNOSIS — Z9889 Other specified postprocedural states: Secondary | ICD-10-CM

## 2016-12-18 HISTORY — PX: IR THORACENTESIS ASP PLEURAL SPACE W/IMG GUIDE: IMG5380

## 2016-12-18 LAB — HIV ANTIBODY (ROUTINE TESTING W REFLEX): HIV Screen 4th Generation wRfx: NONREACTIVE

## 2016-12-18 LAB — BODY FLUID CELL COUNT WITH DIFFERENTIAL
Eos, Fluid: 6 %
Lymphs, Fluid: 35 %
MONOCYTE-MACROPHAGE-SEROUS FLUID: 34 % — AB (ref 50–90)
Neutrophil Count, Fluid: 25 % (ref 0–25)
Other Cells, Fluid: 0 %
WBC FLUID: 1875 uL — AB (ref 0–1000)

## 2016-12-18 LAB — LACTATE DEHYDROGENASE, PLEURAL OR PERITONEAL FLUID: LD FL: 225 U/L — AB (ref 3–23)

## 2016-12-18 LAB — TSH: TSH: 1.28 u[IU]/mL (ref 0.350–4.500)

## 2016-12-18 LAB — GRAM STAIN

## 2016-12-18 LAB — PROTEIN, PLEURAL OR PERITONEAL FLUID: TOTAL PROTEIN, FLUID: 5.3 g/dL

## 2016-12-18 LAB — T4, FREE: FREE T4: 0.95 ng/dL (ref 0.61–1.12)

## 2016-12-18 MED ORDER — LIDOCAINE HCL (PF) 1 % IJ SOLN
INTRAMUSCULAR | Status: AC
  Administered 2016-12-18: 23:00:00
  Filled 2016-12-18: qty 30

## 2016-12-18 MED ORDER — MORPHINE SULFATE (PF) 2 MG/ML IV SOLN
2.0000 mg | INTRAVENOUS | Status: DC | PRN
  Filled 2016-12-18: qty 1

## 2016-12-18 MED ORDER — ENSURE ENLIVE PO LIQD
237.0000 mL | Freq: Two times a day (BID) | ORAL | Status: DC
  Administered 2016-12-19: 237 mL via ORAL

## 2016-12-18 MED ORDER — LIDOCAINE HCL (PF) 1 % IJ SOLN
INTRAMUSCULAR | Status: DC | PRN
  Administered 2016-12-18: 10 mL

## 2016-12-18 NOTE — Progress Notes (Signed)
SATURATION QUALIFICATIONS: (This note is used to comply with regulatory documentation for home oxygen)  Patient Saturations on Room Air at Rest = 97%  Patient Saturations on Room Air while Ambulating = 94%  

## 2016-12-18 NOTE — Procedures (Signed)
Ultrasound-guided diagnostic and therapeutic left thoracentesis performed yielding 0.4 liters of serosanguineous colored fluid. No immediate complications. Follow-up chest x-ray pending.       Davi Kroon E 2:48 PM 12/18/2016

## 2016-12-18 NOTE — Progress Notes (Signed)
Initial Nutrition Assessment  DOCUMENTATION CODES:   Obesity unspecified  INTERVENTION:   Ensure Enlive po BID, each supplement provides 350 kcal and 20 grams of protein  NUTRITION DIAGNOSIS:   Predicted suboptimal nutrient intake related to acute illness as evidenced by percent weight loss.  GOAL:   Patient will meet greater than or equal to 90% of their needs  MONITOR:   PO intake, Supplement acceptance, Labs, Weight trends, Skin, I & O's  REASON FOR ASSESSMENT:   Malnutrition Screening Tool    ASSESSMENT:   Amanda Lozano is a 46 y.o. female with medical history of seizures, HTN who presents after a seizure today. She states she has not been feeling well for a couple of weeks and has had a cough and fevers. She had a miscarriage on July 15 and a seizure after that.  She usually seizes in her sleep and feels foggy the next morning. She went to see her neurologist and no medication changes were made.  Attempted to examine pt x 3, however, pt either receiving nursing care or off unit for procedure.   Per IR notes, pt underwent left thoracentesis, which yielded 0.4 L of fluid.   Reviewed wt hx; noted pt has experienced a 26# (12.7%) wt loss over the past month, which is significant for time frame. Noted wide variability in wt changes. Appears UBW is around 188#.   Unable to complete Nutrition-Focused physical exam at this time.   Labs reviewed.   Diet Order:  Diet regular Room service appropriate? Yes; Fluid consistency: Thin  Skin:  Reviewed, no issues  Last BM:  12/17/16  Height:   Ht Readings from Last 1 Encounters:  12/16/16 5\' 2"  (1.575 m)    Weight:   Wt Readings from Last 1 Encounters:  12/16/16 178 lb 8 oz (81 kg)    Ideal Body Weight:  50 kg  BMI:  Body mass index is 32.65 kg/m.  Estimated Nutritional Needs:   Kcal:  1700-1900  Protein:  90-105 grams  Fluid:  1.7-1.9 L  EDUCATION NEEDS:   Education needs no appropriate at this  time  Bert Givans A. Mayford KnifeWilliams, RD, LDN, CDE Pager: [phone] After hours Pager: [phone]

## 2016-12-18 NOTE — Progress Notes (Addendum)
PROGRESS NOTE    Amanda Lozano   ZOX:[id number]  DOB: [birth year]  DOA: 12/16/2016 PCP: [name], MD   Brief Narrative:   Amanda Lozano is a 46 y.o. female with medical history of seizures, HTN who presents after a suspected seizure. She states she has not been feeling well for a couple of weeks and has had a cough and fevers. She had a miscarriage on July 15 and a seizure after that.  She knows when she has a seizure as she usually seizes in her sleep and feels foggy and sore in the morning. She went to see her neurologist and no medication changes were made. She is found to have a fever of 101 and tachycardia in the ER. Extensive work up done including pelvic and renal ultrasound (due to miscarriage) which showed a pleural effusion. CXR ordered.  She is found to have a mod/large left pleural effusion and consolidation in the left lung.  She is not hypoxic.  Subjective:  no complaints ROS: no complaints of nausea, vomiting, constipation diarrhea or dysuria. No other complaints.   Assessment & Plan:   Principal Problem:   LLL pneumonia / Pleural effusion     Sepsis  - Rocephin/ Zithromax- strep pneumo U antigen + - d/c zithromax 8/13 - thoracentesis done- only 0.4 L removed- mostly lymphocytes and monocytes- exudative per Light's criteria, likely parapneumonic- gr stain and culture pending  repeat CXR showing remaining effusion - blood cultures negative - fever resolved after starting antibiotics- patient feeling better - not hypoxic on exertion today  Active Problems:  Tachycardia - fevers resolved but still tachycardic - already on Atenolol but HR persistently> 100 - checked TSH and free T 4- normal    Convulsions/seizures (HCC)   - initially she thought she may have had a seizure in her sleep but then states she probably hasn't as she did not have the typical post ictal symptoms - Tegretol level normal    Benign essential HTN Norvasc, HTN  Recent  Miscarriage - U preg neg- pelvic ultrasound unrevealing  DVT prophylaxis: lovenox Code Status: Full code Family Communication:  Disposition Plan: home in 1-2 days Consultants:    Procedures:    Antimicrobials:  Anti-infectives    Start     Dose/Rate Route Frequency Ordered Stop   12/17/16 1800  cefTRIAXone (ROCEPHIN) 1 g in dextrose 5 % 50 mL IVPB     1 g 100 mL/hr over 30 Minutes Intravenous Every 24 hours 12/16/16 1748 12/24/16 1759   12/17/16 1800  azithromycin (ZITHROMAX) tablet 500 mg  Status:  Discontinued     500 mg Oral Every 24 hours 12/16/16 1748 12/17/16 1213   12/16/16 1730  metroNIDAZOLE (FLAGYL) IVPB 500 mg     500 mg 100 mL/hr over 60 Minutes Intravenous  Once 12/16/16 1727 12/16/16 2155   12/16/16 1715  cefTRIAXone (ROCEPHIN) 1 g in dextrose 5 % 50 mL IVPB     1 g 100 mL/hr over 30 Minutes Intravenous  Once 12/16/16 1714 12/16/16 1856   12/16/16 1715  azithromycin (ZITHROMAX) 500 mg in dextrose 5 % 250 mL IVPB     500 mg 250 mL/hr over 60 Minutes Intravenous  Once 12/16/16 1714 12/16/16 2255       Objective: Vitals:   12/17/16 1518 12/17/16 2307 12/18/16 0623 12/18/16 0859  BP: 126/88 135/83 (!) 131/95 (!) 142/94  Pulse: (!) 102 (!) 110 (!) 110   Resp: (!) 23 17 18    Temp: 98.4 F (36.9 C)  99.1 F (37.3 C) 97.8 F (36.6 C)   TempSrc: Oral Oral Oral   SpO2: 93% 100% 100%   Weight:      Height:        Intake/Output Summary (Last 24 hours) at 12/18/16 0914 Last data filed at 12/17/16 2200  Gross per 24 hour  Intake              160 ml  Output                0 ml  Net              160 ml   Filed Weights   12/16/16 0456  Weight: 81 kg (178 lb 8 oz)    Examination: General exam: Appears comfortable  HEENT: PERRLA, oral mucosa moist, no sclera icterus or thrush Respiratory system: decreased breath sounds in left lung field Respiratory effort normal. Cardiovascular system: S1 & S2 heard, RRR.  No murmurs  Gastrointestinal system: Abdomen  soft, non-tender, nondistended. Normal bowel sound. No organomegaly Central nervous system: Alert and oriented. No focal neurological deficits. Extremities: No cyanosis, clubbing or edema Skin: No rashes or ulcers Psychiatry:  Mood & affect appropriate.     Data Reviewed: I have personally reviewed following labs and imaging studies  CBC:  Recent Labs Lab 12/16/16 0459 12/17/16 0445  WBC 9.6 6.3  HGB 9.4* 9.0*  HCT 29.5* 28.8*  MCV 82.6 83.5  PLT 519* 493*   Basic Metabolic Panel:  Recent Labs Lab 12/16/16 0459 12/17/16 0445  NA 135 137  K 3.0* 3.7  CL 97* 104  CO2 24 24  GLUCOSE 117* 106*  BUN 10 7  CREATININE 0.86 0.69  CALCIUM 8.3* 8.3*  MG  --  1.6*   GFR: Estimated Creatinine Clearance: 88.5 mL/min (by C-G formula based on SCr of 0.69 mg/dL). Liver Function Tests:  Recent Labs Lab 12/17/16 0445  PROT 8.0   No results for input(s): LIPASE, AMYLASE in the last 168 hours. No results for input(s): AMMONIA in the last 168 hours. Coagulation Profile: No results for input(s): INR, PROTIME in the last 168 hours. Cardiac Enzymes: No results for input(s): CKTOTAL, CKMB, CKMBINDEX, TROPONINI in the last 168 hours. BNP (last 3 results) No results for input(s): PROBNP in the last 8760 hours. HbA1C: No results for input(s): HGBA1C in the last 72 hours. CBG: No results for input(s): GLUCAP in the last 168 hours. Lipid Profile: No results for input(s): CHOL, HDL, LDLCALC, TRIG, CHOLHDL, LDLDIRECT in the last 72 hours. Thyroid Function Tests: No results for input(s): TSH, T4TOTAL, FREET4, T3FREE, THYROIDAB in the last 72 hours. Anemia Panel: No results for input(s): VITAMINB12, FOLATE, FERRITIN, TIBC, IRON, RETICCTPCT in the last 72 hours. Urine analysis:    Component Value Date/Time   COLORURINE AMBER (A) 12/16/2016 0730   APPEARANCEUR HAZY (A) 12/16/2016 0730   LABSPEC 1.024 12/16/2016 0730   PHURINE 6.0 12/16/2016 0730   GLUCOSEU NEGATIVE 12/16/2016 0730    HGBUR SMALL (A) 12/16/2016 0730   BILIRUBINUR NEGATIVE 12/16/2016 0730   KETONESUR 20 (A) 12/16/2016 0730   PROTEINUR 100 (A) 12/16/2016 0730   UROBILINOGEN 0.2 03/25/2011 0940   NITRITE NEGATIVE 12/16/2016 0730   LEUKOCYTESUR NEGATIVE 12/16/2016 0730   Sepsis Labs: @LABRCNTIP (procalcitonin:4,lacticidven:4) ) Recent Results (from the past 240 hour(s))  Blood culture (routine x 2)     Status: None (Preliminary result)   Collection Time: 12/16/16  6:20 AM  Result Value Ref Range Status   Specimen Description BLOOD  RIGHT ANTECUBITAL  Final   Special Requests   Final    BOTTLES DRAWN AEROBIC AND ANAEROBIC Blood Culture adequate volume   Culture NO GROWTH 1 DAY  Final   Report Status PENDING  Incomplete  Urine culture     Status: Abnormal   Collection Time: 12/16/16  7:30 AM  Result Value Ref Range Status   Specimen Description URINE, CLEAN CATCH  Final   Special Requests NONE  Final   Culture MULTIPLE SPECIES PRESENT, SUGGEST RECOLLECTION (A)  Final   Report Status 12/17/2016 FINAL  Final  Culture, blood (routine x 2) Call MD if unable to obtain prior to antibiotics being given     Status: None (Preliminary result)   Collection Time: 12/16/16  7:43 PM  Result Value Ref Range Status   Specimen Description BLOOD LEFT ANTECUBITAL  Final   Special Requests IN PEDIATRIC BOTTLE Blood Culture adequate volume  Final   Culture NO GROWTH < 24 HOURS  Final   Report Status PENDING  Incomplete         Radiology Studies: Dg Chest 2 View  Result Date: 12/16/2016 CLINICAL DATA:  46 year old female with history of fever. Lethargy. Seizure this morning. EXAM: CHEST  2 VIEW COMPARISON:  No priors. FINDINGS: Trace right and moderate to large left pleural effusion. Associated atelectasis and/or consolidation in the left lung base. No evidence of pulmonary edema. Cardiac silhouette is largely obscured. Upper mediastinal contours are within normal limits. IMPRESSION: 1. Atelectasis and/or  consolidation in the left lower lobe with moderate to large left pleural effusion. Given the patient's history, findings may reflect sequela of recent aspiration. 2. Trace right pleural effusion. Electronically Signed   By: Trudie Reedaniel  Entrikin M.D.   On: 12/16/2016 17:18   Mr Brain Wo Contrast  Result Date: 12/17/2016 CLINICAL DATA:  Seizure EXAM: MRI HEAD WITHOUT CONTRAST TECHNIQUE: Multiplanar, multiecho pulse sequences of the brain and surrounding structures were obtained without intravenous contrast. COMPARISON:  None. FINDINGS: Brain: The midline structures are normal. There is no focal diffusion restriction to indicate acute infarct. The brain parenchymal signal is normal and there is no mass lesion. No intraparenchymal hematoma or chronic microhemorrhage. Brain volume is normal for age without age-advanced or lobar predominant atrophy. The dura is normal and there is no extra-axial collection. Vascular: Major intracranial arterial and venous sinus flow voids are preserved. Skull and upper cervical spine: The visualized skull base, calvarium, upper cervical spine and extracranial soft tissues are normal. Sinuses/Orbits: No fluid levels or advanced mucosal thickening. No mastoid or middle ear effusion. Normal orbits. IMPRESSION: Normal MRI of the brain. Electronically Signed   By: Deatra RobinsonKevin  Herman M.D.   On: 12/17/2016 22:39   [place] Transvaginal Non-ob  Result Date: 12/16/2016 CLINICAL DATA:  Recent spontaneous abortion 11/18/2016. Persistent pelvic pain. EXAM: TRANSABDOMINAL AND TRANSVAGINAL ULTRASOUND OF PELVIS DOPPLER ULTRASOUND OF OVARIES TECHNIQUE: Both transabdominal and transvaginal ultrasound examinations of the pelvis were performed. Transabdominal technique was performed for global imaging of the pelvis including uterus, ovaries, adnexal regions, and pelvic cul-de-sac. It was necessary to proceed with endovaginal exam following the transabdominal exam to visualize the ovaries and endometrium. Color and  duplex Doppler ultrasound was utilized to evaluate blood flow to the ovaries. COMPARISON:  11/18/2016 FINDINGS: Uterus Measurements: 7.8 x 3.4 x 4.3 cm. No fibroids or other mass visualized. Small nabothian cysts are noted near the cervix. Endometrium Thickness: 10.0 mm.  No findings for retained products or mass. Right ovary Measurements: 2.6 x 1.7 x 1.8 cm.  Normal appearance/no adnexal mass. Left ovary Measurements: 2.0 x 1.4 x 1.7 cm. Normal appearance/no adnexal mass. Pulsed Doppler evaluation of both ovaries demonstrates normal low-resistance arterial and venous waveforms. Other findings No abnormal free fluid. There is a small left-sided paraovarian cyst measuring 1.3 x 0.6 x 0.8 cm. IMPRESSION: 1. Unremarkable sonographic appearance of the uterus and ovaries. 2. Small left paraovarian cyst. Electronically Signed   By: Rudie Meyer M.D.   On: 12/16/2016 15:58   US Pelvis Complete  Result Date: 12/16/2016 CLINICAL DATA:  Recent spontaneous abortion 11/18/2016. Persistent pelvic pain. EXAM: TRANSABDOMINAL AND TRANSVAGINAL ULTRASOUND OF PELVIS DOPPLER ULTRASOUND OF OVARIES TECHNIQUE: Both transabdominal and transvaginal ultrasound examinations of the pelvis were performed. Transabdominal technique was performed for global imaging of the pelvis including uterus, ovaries, adnexal regions, and pelvic cul-de-sac. It was necessary to proceed with endovaginal exam following the transabdominal exam to visualize the ovaries and endometrium. Color and duplex Doppler ultrasound was utilized to evaluate blood flow to the ovaries. COMPARISON:  11/18/2016 FINDINGS: Uterus Measurements: 7.8 x 3.4 x 4.3 cm. No fibroids or other mass visualized. Small nabothian cysts are noted near the cervix. Endometrium Thickness: 10.0 mm.  No findings for retained products or mass. Right ovary Measurements: 2.6 x 1.7 x 1.8 cm. Normal appearance/no adnexal mass. Left ovary Measurements: 2.0 x 1.4 x 1.7 cm. Normal appearance/no adnexal  mass. Pulsed Doppler evaluation of both ovaries demonstrates normal low-resistance arterial and venous waveforms. Other findings No abnormal free fluid. There is a small left-sided paraovarian cyst measuring 1.3 x 0.6 x 0.8 cm. IMPRESSION: 1. Unremarkable sonographic appearance of the uterus and ovaries. 2. Small left paraovarian cyst. Electronically Signed   By: Rudie Meyer M.D.   On: 12/16/2016 15:58   US Renal  Result Date: 12/16/2016 CLINICAL DATA:  Bilateral back and flank pain since 11/18/2016. EXAM: RENAL / URINARY TRACT ULTRASOUND COMPLETE COMPARISON:  None. FINDINGS: Right Kidney: Length: 10.8 cm.  Mildly echogenic.  No hydronephrosis. Left Kidney: Length: 10.1 cm. Echogenicity within normal limits. No mass or hydronephrosis visualized. Bladder: Not visualized. Other:  Moderate size left pleural effusion. IMPRESSION: 1. No hydronephrosis. 2. Moderate-sized left pleural effusion. 3. Mildly echogenic right kidney, compatible with medical renal disease. Electronically Signed   By: Beckie Salts M.D.   On: 12/16/2016 16:02   [place] Art/ven Flow Abd Pelv Doppler  Result Date: 12/16/2016 CLINICAL DATA:  Recent spontaneous abortion 11/18/2016. Persistent pelvic pain. EXAM: TRANSABDOMINAL AND TRANSVAGINAL ULTRASOUND OF PELVIS DOPPLER ULTRASOUND OF OVARIES TECHNIQUE: Both transabdominal and transvaginal ultrasound examinations of the pelvis were performed. Transabdominal technique was performed for global imaging of the pelvis including uterus, ovaries, adnexal regions, and pelvic cul-de-sac. It was necessary to proceed with endovaginal exam following the transabdominal exam to visualize the ovaries and endometrium. Color and duplex Doppler ultrasound was utilized to evaluate blood flow to the ovaries. COMPARISON:  11/18/2016 FINDINGS: Uterus Measurements: 7.8 x 3.4 x 4.3 cm. No fibroids or other mass visualized. Small nabothian cysts are noted near the cervix. Endometrium Thickness: 10.0 mm.  No findings for  retained products or mass. Right ovary Measurements: 2.6 x 1.7 x 1.8 cm. Normal appearance/no adnexal mass. Left ovary Measurements: 2.0 x 1.4 x 1.7 cm. Normal appearance/no adnexal mass. Pulsed Doppler evaluation of both ovaries demonstrates normal low-resistance arterial and venous waveforms. Other findings No abnormal free fluid. There is a small left-sided paraovarian cyst measuring 1.3 x 0.6 x 0.8 cm. IMPRESSION: 1. Unremarkable sonographic appearance of the uterus and ovaries. 2. Small  left paraovarian cyst. Electronically Signed   By: Rudie Meyer M.D.   On: 12/16/2016 15:58      Scheduled Meds: . amLODipine  5 mg Oral Daily  . atenolol  100 mg Oral Daily  . carbamazepine  300 mg Oral BID WC  . enoxaparin (LOVENOX) injection  40 mg Subcutaneous Q24H  . sodium chloride flush  3 mL Intravenous Q12H   Continuous Infusions: . sodium chloride    . cefTRIAXone (ROCEPHIN)  IV Stopped (12/17/16 1821)     LOS: 2 days    Time spent in minutes: 35    Calvert Cantor, MD [facility] Pager: [email or web address] Password [facility] 12/18/2016, 9:14 AM

## 2016-12-19 ENCOUNTER — Encounter (HOSPITAL_COMMUNITY): Payer: Self-pay | Admitting: Family Medicine

## 2016-12-19 DIAGNOSIS — R569 Unspecified convulsions: Secondary | ICD-10-CM

## 2016-12-19 DIAGNOSIS — J181 Lobar pneumonia, unspecified organism: Secondary | ICD-10-CM

## 2016-12-19 DIAGNOSIS — I10 Essential (primary) hypertension: Secondary | ICD-10-CM

## 2016-12-19 DIAGNOSIS — J90 Pleural effusion, not elsewhere classified: Secondary | ICD-10-CM

## 2016-12-19 LAB — PH, BODY FLUID: pH, Body Fluid: 7.9

## 2016-12-19 LAB — CARBAMAZEPINE, FREE AND TOTAL
Carbamazepine, Free: 2.1 ug/mL (ref 0.6–4.2)
Carbamazepine, Total: 9.2 ug/mL (ref 4.0–12.0)

## 2016-12-19 MED ORDER — DOXYCYCLINE HYCLATE 100 MG PO CAPS: 100.0000 mg | ORAL_CAPSULE | Freq: Two times a day (BID) | ORAL | 0 refills | Status: AC

## 2016-12-19 NOTE — Discharge Instructions (Signed)
Follow with Primary MD  Jackie Plumsei-Bonsu, George, MD  and other consultant's as instructed your Hospitalist MD  Please get a complete blood count and chemistry panel checked by your Primary MD at your next visit, and again as instructed by your Primary MD.  Get Medicines reviewed and adjusted: Please take all your medications with you for your next visit with your Primary MD  Laboratory/radiological data: Please request your Primary MD to go over all hospital tests and procedure/radiological results at the follow up, please ask your Primary MD to get all Hospital records sent to his/her office.  In some cases, they will be blood work, cultures and biopsy results pending at the time of your discharge. Please request that your primary care M.D. follows up on these results.  Also Note the following: If you experience worsening of your admission symptoms, develop shortness of breath, life threatening emergency, suicidal or homicidal thoughts you must seek medical attention immediately by calling 911 or calling your MD immediately  if symptoms less severe.  You must read complete instructions/literature along with all the possible adverse reactions/side effects for all the Medicines you take and that have been prescribed to you. Take any new Medicines after you have completely understood and accpet all the possible adverse reactions/side effects.   Do not drive when taking Pain medications or sleeping medications (Benzodaizepines)  Do not take more than prescribed Pain, Sleep and Anxiety Medications. It is not advisable to combine anxiety,sleep and pain medications without talking with your primary care practitioner  Special Instructions: If you have smoked or chewed Tobacco  in the last 2 yrs please stop smoking, stop any regular Alcohol  and or any Recreational drug use.  Wear Seat belts while driving.  Please note: You were cared for by a hospitalist during your hospital stay. Once you are  discharged, your primary care physician will handle any further medical issues. Please note that NO REFILLS for any discharge medications will be authorized once you are discharged, as it is imperative that you return to your primary care physician (or establish a relationship with a primary care physician if you do not have one) for your post hospital discharge needs so that they can reassess your need for medications and monitor your lab values.

## 2016-12-19 NOTE — Discharge Summary (Addendum)
Physician Discharge Summary  Amanda Lozano ZOX:[id number] DOB: 1971-02-01 DOA: 12/16/2016  PCP: [name], MD  Admit date: 12/16/2016 Discharge date: 12/19/2016  Admitted From: Home  Disposition: Home   Recommendations for Outpatient Follow-up:  1. Follow up with PCP in 1 weeks 2. Please obtain BMP/CBC in one week  Discharge Condition: STABLE   CODE STATUS: FULL    Brief Hospitalization Summary: Please see all hospital notes, images, labs for full details of the hospitalization.  HPI: Amanda Lozano is a 46 y.o. female with medical history of seizures, HTN who presents after a seizure today. She states she has not been feeling well for a couple of weeks and has had a cough and fevers. She had a miscarriage on July 15 and a seizure after that.  She usually seizes in her sleep and feels foggy the next morning. She went to see her neurologist and no medication changes were made. She is found to have a fever and tachycardia in the ER. Extensive work up done including pelvic and renal ultrasound which show a pleural effusion. CXR ordered.  She is found to have a mod/large left pleural effusion and consolidation in the left lung.  She is not hypoxic.    LLL pneumonia / Pleural effusion     Sepsis Ruled In  - Rocephin/ Zithromax- strep pneumo U antigen + - d/c zithromax 8/13 - thoracentesis done- only 0.4 L removed- mostly lymphocytes and monocytes- exudative per Light's criteria, likely parapneumonic- gr stain and culture pending  repeat CXR showing remaining effusion - blood cultures negative - fever resolved after starting antibiotics- patient feeling better - not hypoxic on exertion  - Pt demanding to discharge home, will discharge today on oral doxycycline with close follow up with PCP.    Tachycardia - likely secondary to infection - already on Atenolol but HR persistently> 100 - TSH and free T 4- normal    Convulsions/seizures (HCC) - initially she thought she may  have had a seizure in her sleep but then states she probably hasn't as she did not have the typical post ictal symptoms - Tegretol level normal, follow up with her neurologist.     Benign essential HTN Norvasc, HTN  Recent Miscarriage - U preg neg- pelvic ultrasound unrevealing  DVT prophylaxis: lovenox Code Status: Full code Family Communication:  Disposition Plan: home Consultants:   Discharge Diagnoses:  Principal Problem:   LLL pneumonia (HCC) Active Problems:   Convulsions/seizures (HCC)   Pleural effusion   Benign essential HTN   Sepsis (HCC)  Discharge Instructions: Discharge Instructions    Call MD for:  difficulty breathing, headache or visual disturbances    Complete by:  As directed    Call MD for:  extreme fatigue    Complete by:  As directed    Call MD for:  persistant dizziness or light-headedness    Complete by:  As directed    Call MD for:  persistant nausea and vomiting    Complete by:  As directed    Call MD for:  severe uncontrolled pain    Complete by:  As directed    Call MD for:  temperature >100.4    Complete by:  As directed    Increase activity slowly    Complete by:  As directed      Allergies as of 12/19/2016   No Known Allergies     Medication List    STOP taking these medications   folic acid 1 MG tablet Commonly known  as:  FOLVITE   gabapentin 100 MG capsule Commonly known as:  NEURONTIN     TAKE these medications   amLODipine 5 MG tablet Commonly known as:  NORVASC Take 1 tablet (5 mg total) by mouth daily.   atenolol 100 MG tablet Commonly known as:  TENORMIN Take 1 tablet (100 mg total) by mouth daily.   carbamazepine 100 MG chewable tablet Commonly known as:  TEGRETOL CHEW AND SWALLOW 3 TABLETS BY MOUTH TWICE DAILY   doxycycline 100 MG capsule Commonly known as:  VIBRAMYCIN Take 1 capsule (100 mg total) by mouth 2 (two) times daily.      Follow-up Information    Osei-Bonsu, Greggory Stallion, MD. Schedule an  appointment as soon as possible for a visit in 1 week(s).   Specialty:  Internal Medicine Contact information: [address] [place] [address] [phone]        Drema Dallas, DO. Schedule an appointment as soon as possible for a visit in 2 week(s).   Specialty:  Neurology Why:  Hospital Follow Up  Contact information: [address] [place] [address] [phone]          No Known Allergies Current Discharge Medication List    START taking these medications   Details  doxycycline (VIBRAMYCIN) 100 MG capsule Take 1 capsule (100 mg total) by mouth 2 (two) times daily. Qty: 10 capsule, Refills: 0      CONTINUE these medications which have NOT CHANGED   Details  amLODipine (NORVASC) 5 MG tablet Take 1 tablet (5 mg total) by mouth daily. Qty: 30 tablet, Refills: 1   Associated Diagnoses: Vaginal bleeding affecting early pregnancy; Complete miscarriage; Maternal chronic hypertension in first trimester; Uncontrolled hypertension    atenolol (TENORMIN) 100 MG tablet Take 1 tablet (100 mg total) by mouth daily. Qty: 30 tablet, Refills: 1   Associated Diagnoses: Vaginal bleeding affecting early pregnancy; Complete miscarriage; Maternal chronic hypertension in first trimester; Uncontrolled hypertension    carbamazepine (TEGRETOL) 100 MG chewable tablet CHEW AND SWALLOW 3 TABLETS BY MOUTH TWICE DAILY Qty: 540 tablet, Refills: 3      STOP taking these medications     folic acid (FOLVITE) 1 MG tablet      gabapentin (NEURONTIN) 100 MG capsule         Procedures/Studies: Dg Chest 1 View  Result Date: 12/18/2016 CLINICAL DATA:  Status post thoracentesis. EXAM: CHEST 1 VIEW COMPARISON:  Radiographs of December 16, 2016. FINDINGS: Stable enlargement of cardiac silhouette concerning for pericardial effusion. Right lung is clear. No pneumothorax is noted. Left pleural effusion may be slightly improved. Associated atelectasis or infiltrate  is noted as well. IMPRESSION: No pneumothorax is noted. Stable enlargement of cardiac silhouette concerning for pericardial effusion. Left pleural effusion may be slightly improved. Electronically Signed   By: Lupita Raider, M.D.   On: 12/18/2016 15:12   Dg Chest 2 View  Result Date: 12/16/2016 CLINICAL DATA:  46 year old female with history of fever. Lethargy. Seizure this morning. EXAM: CHEST  2 VIEW COMPARISON:  No priors. FINDINGS: Trace right and moderate to large left pleural effusion. Associated atelectasis and/or consolidation in the left lung base. No evidence of pulmonary edema. Cardiac silhouette is largely obscured. Upper mediastinal contours are within normal limits. IMPRESSION: 1. Atelectasis and/or consolidation in the left lower lobe with moderate to large left pleural effusion. Given the patient's history, findings may reflect sequela of recent aspiration. 2. Trace right pleural effusion. Electronically  Signed   By: Trudie Reedaniel  Entrikin M.D.   On: 12/16/2016 17:18   Mr Brain Wo Contrast  Result Date: 12/17/2016 CLINICAL DATA:  Seizure EXAM: MRI HEAD WITHOUT CONTRAST TECHNIQUE: Multiplanar, multiecho pulse sequences of the brain and surrounding structures were obtained without intravenous contrast. COMPARISON:  None. FINDINGS: Brain: The midline structures are normal. There is no focal diffusion restriction to indicate acute infarct. The brain parenchymal signal is normal and there is no mass lesion. No intraparenchymal hematoma or chronic microhemorrhage. Brain volume is normal for age without age-advanced or lobar predominant atrophy. The dura is normal and there is no extra-axial collection. Vascular: Major intracranial arterial and venous sinus flow voids are preserved. Skull and upper cervical spine: The visualized skull base, calvarium, upper cervical spine and extracranial soft tissues are normal. Sinuses/Orbits: No fluid levels or advanced mucosal thickening. No mastoid or middle ear  effusion. Normal orbits. IMPRESSION: Normal MRI of the brain. Electronically Signed   By: Deatra RobinsonKevin  Herman M.D.   On: 12/17/2016 22:39   [place] Transvaginal Non-ob  Result Date: 12/16/2016 CLINICAL DATA:  Recent spontaneous abortion 11/18/2016. Persistent pelvic pain. EXAM: TRANSABDOMINAL AND TRANSVAGINAL ULTRASOUND OF PELVIS DOPPLER ULTRASOUND OF OVARIES TECHNIQUE: Both transabdominal and transvaginal ultrasound examinations of the pelvis were performed. Transabdominal technique was performed for global imaging of the pelvis including uterus, ovaries, adnexal regions, and pelvic cul-de-sac. It was necessary to proceed with endovaginal exam following the transabdominal exam to visualize the ovaries and endometrium. Color and duplex Doppler ultrasound was utilized to evaluate blood flow to the ovaries. COMPARISON:  11/18/2016 FINDINGS: Uterus Measurements: 7.8 x 3.4 x 4.3 cm. No fibroids or other mass visualized. Small nabothian cysts are noted near the cervix. Endometrium Thickness: 10.0 mm.  No findings for retained products or mass. Right ovary Measurements: 2.6 x 1.7 x 1.8 cm. Normal appearance/no adnexal mass. Left ovary Measurements: 2.0 x 1.4 x 1.7 cm. Normal appearance/no adnexal mass. Pulsed Doppler evaluation of both ovaries demonstrates normal low-resistance arterial and venous waveforms. Other findings No abnormal free fluid. There is a small left-sided paraovarian cyst measuring 1.3 x 0.6 x 0.8 cm. IMPRESSION: 1. Unremarkable sonographic appearance of the uterus and ovaries. 2. Small left paraovarian cyst. Electronically Signed   By: Rudie MeyerP.  Gallerani M.D.   On: 12/16/2016 15:58   [place] Pelvis Complete  Result Date: 12/16/2016 CLINICAL DATA:  Recent spontaneous abortion 11/18/2016. Persistent pelvic pain. EXAM: TRANSABDOMINAL AND TRANSVAGINAL ULTRASOUND OF PELVIS DOPPLER ULTRASOUND OF OVARIES TECHNIQUE: Both transabdominal and transvaginal ultrasound examinations of the pelvis were performed. Transabdominal  technique was performed for global imaging of the pelvis including uterus, ovaries, adnexal regions, and pelvic cul-de-sac. It was necessary to proceed with endovaginal exam following the transabdominal exam to visualize the ovaries and endometrium. Color and duplex Doppler ultrasound was utilized to evaluate blood flow to the ovaries. COMPARISON:  11/18/2016 FINDINGS: Uterus Measurements: 7.8 x 3.4 x 4.3 cm. No fibroids or other mass visualized. Small nabothian cysts are noted near the cervix. Endometrium Thickness: 10.0 mm.  No findings for retained products or mass. Right ovary Measurements: 2.6 x 1.7 x 1.8 cm. Normal appearance/no adnexal mass. Left ovary Measurements: 2.0 x 1.4 x 1.7 cm. Normal appearance/no adnexal mass. Pulsed Doppler evaluation of both ovaries demonstrates normal low-resistance arterial and venous waveforms. Other findings No abnormal free fluid. There is a small left-sided paraovarian cyst measuring 1.3 x 0.6 x 0.8 cm. IMPRESSION: 1. Unremarkable sonographic appearance of the uterus and ovaries. 2. Small left paraovarian cyst. Electronically Signed  By: Rudie Meyer M.D.   On: 12/16/2016 15:58   US Renal  Result Date: 12/16/2016 CLINICAL DATA:  Bilateral back and flank pain since 11/18/2016. EXAM: RENAL / URINARY TRACT ULTRASOUND COMPLETE COMPARISON:  None. FINDINGS: Right Kidney: Length: 10.8 cm.  Mildly echogenic.  No hydronephrosis. Left Kidney: Length: 10.1 cm. Echogenicity within normal limits. No mass or hydronephrosis visualized. Bladder: Not visualized. Other:  Moderate size left pleural effusion. IMPRESSION: 1. No hydronephrosis. 2. Moderate-sized left pleural effusion. 3. Mildly echogenic right kidney, compatible with medical renal disease. Electronically Signed   By: Beckie Salts M.D.   On: 12/16/2016 16:02   [place] Art/ven Flow Abd Pelv Doppler  Result Date: 12/16/2016 CLINICAL DATA:  Recent spontaneous abortion 11/18/2016. Persistent pelvic pain. EXAM: TRANSABDOMINAL AND  TRANSVAGINAL ULTRASOUND OF PELVIS DOPPLER ULTRASOUND OF OVARIES TECHNIQUE: Both transabdominal and transvaginal ultrasound examinations of the pelvis were performed. Transabdominal technique was performed for global imaging of the pelvis including uterus, ovaries, adnexal regions, and pelvic cul-de-sac. It was necessary to proceed with endovaginal exam following the transabdominal exam to visualize the ovaries and endometrium. Color and duplex Doppler ultrasound was utilized to evaluate blood flow to the ovaries. COMPARISON:  11/18/2016 FINDINGS: Uterus Measurements: 7.8 x 3.4 x 4.3 cm. No fibroids or other mass visualized. Small nabothian cysts are noted near the cervix. Endometrium Thickness: 10.0 mm.  No findings for retained products or mass. Right ovary Measurements: 2.6 x 1.7 x 1.8 cm. Normal appearance/no adnexal mass. Left ovary Measurements: 2.0 x 1.4 x 1.7 cm. Normal appearance/no adnexal mass. Pulsed Doppler evaluation of both ovaries demonstrates normal low-resistance arterial and venous waveforms. Other findings No abnormal free fluid. There is a small left-sided paraovarian cyst measuring 1.3 x 0.6 x 0.8 cm. IMPRESSION: 1. Unremarkable sonographic appearance of the uterus and ovaries. 2. Small left paraovarian cyst. Electronically Signed   By: Rudie Meyer M.D.   On: 12/16/2016 15:58   Ir Thoracentesis Asp Pleural Space W/img Guide  Result Date: 12/18/2016 INDICATION: Shortness of breath and new left pleural effusion. Request is made for diagnostic and therapeutic thoracentesis. EXAM: ULTRASOUND GUIDED DIAGNOSTIC AND THERAPEUTIC THORACENTESIS MEDICATIONS: 1% xylocaine COMPLICATIONS: None immediate. PROCEDURE: An ultrasound guided thoracentesis was thoroughly discussed with the patient and questions answered. The benefits, risks, alternatives and complications were also discussed. The patient understands and wishes to proceed with the procedure. Written consent was obtained. Ultrasound was  performed to localize and mark an adequate pocket of fluid in the left chest. The area was then prepped and draped in the normal sterile fashion. 1% xylocaine was used for local anesthesia. Under ultrasound guidance a Safe-T-Centesis catheter was introduced. Thoracentesis was performed. The catheter was removed and a dressing applied. FINDINGS: A total of approximately 0.4 L of serosanguineous fluid was removed. Samples were sent to the laboratory as requested by the clinical team. IMPRESSION: Successful ultrasound guided left thoracentesis yielding 0.4 L of pleural fluid. Read by: Barnetta Chapel, PA-C Electronically Signed   By: Simonne Come M.D.   On: 12/18/2016 15:32     Subjective: Pt demanding to go home, says she feels fine, says she will follow up with PCP  Discharge Exam: Vitals:   12/19/16 0516 12/19/16 0915  BP:  131/80  Pulse: (!) 115   Resp:    Temp:    SpO2: 100%    Vitals:   12/18/16 2155 12/19/16 0515 12/19/16 0516 12/19/16 0915  BP: 131/82 131/82  131/80  Pulse: (!) 114 (!) 107 (!) 115  Resp: 17 16    Temp: 98.5 F (36.9 C) 97.6 F (36.4 C)    TempSrc: Oral Oral    SpO2: 99% 95% 100%   Weight:      Height:       General: Pt is alert, awake, not in acute distress Cardiovascular: tachy, normal S1/S2 +, no rubs, no gallops Respiratory: CTA bilaterally, no wheezing, no rhonchi Abdominal: Soft, NT, ND, bowel sounds + Extremities: no edema, no cyanosis   The results of significant diagnostics from this hospitalization (including imaging, microbiology, ancillary and laboratory) are listed below for reference.     Microbiology: Recent Results (from the past 240 hour(s))  Blood culture (routine x 2)     Status: None (Preliminary result)   Collection Time: 12/16/16  6:20 AM  Result Value Ref Range Status   Specimen Description BLOOD RIGHT ANTECUBITAL  Final   Special Requests   Final    BOTTLES DRAWN AEROBIC AND ANAEROBIC Blood Culture adequate volume   Culture NO  GROWTH 3 DAYS  Final   Report Status PENDING  Incomplete  Urine culture     Status: Abnormal   Collection Time: 12/16/16  7:30 AM  Result Value Ref Range Status   Specimen Description URINE, CLEAN CATCH  Final   Special Requests NONE  Final   Culture MULTIPLE SPECIES PRESENT, SUGGEST RECOLLECTION (A)  Final   Report Status 12/17/2016 FINAL  Final  Culture, blood (routine x 2) Call MD if unable to obtain prior to antibiotics being given     Status: None (Preliminary result)   Collection Time: 12/16/16  7:43 PM  Result Value Ref Range Status   Specimen Description BLOOD LEFT ANTECUBITAL  Final   Special Requests IN PEDIATRIC BOTTLE Blood Culture adequate volume  Final   Culture NO GROWTH 3 DAYS  Final   Report Status PENDING  Incomplete  Culture, body fluid-bottle     Status: None (Preliminary result)   Collection Time: 12/18/16  2:56 PM  Result Value Ref Range Status   Specimen Description FLUID LEFT PLEURAL  Final   Special Requests BOTTLES DRAWN AEROBIC AND ANAEROBIC 10CC  Final   Culture NO GROWTH < 24 HOURS  Final   Report Status PENDING  Incomplete  Gram stain     Status: None   Collection Time: 12/18/16  2:56 PM  Result Value Ref Range Status   Specimen Description FLUID LEFT PLEURAL  Final   Special Requests NONE  Final   Gram Stain   Final    ABUNDANT WBC PRESENT,BOTH PMN AND MONONUCLEAR NO ORGANISMS SEEN    Report Status 12/18/2016 FINAL  Final     Labs: BNP (last 3 results) No results for input(s): BNP in the last 8760 hours. Basic Metabolic Panel:  Recent Labs Lab 12/16/16 0459 12/17/16 0445  NA 135 137  K 3.0* 3.7  CL 97* 104  CO2 24 24  GLUCOSE 117* 106*  BUN 10 7  CREATININE 0.86 0.69  CALCIUM 8.3* 8.3*  MG  --  1.6*   Liver Function Tests:  Recent Labs Lab 12/17/16 0445  PROT 8.0   No results for input(s): LIPASE, AMYLASE in the last 168 hours. No results for input(s): AMMONIA in the last 168 hours. CBC:  Recent Labs Lab 12/16/16 0459  12/17/16 0445  WBC 9.6 6.3  HGB 9.4* 9.0*  HCT 29.5* 28.8*  MCV 82.6 83.5  PLT 519* 493*   Cardiac Enzymes: No results for input(s): CKTOTAL, CKMB, CKMBINDEX, TROPONINI in  the last 168 hours. BNP: Invalid input(s): POCBNP CBG: No results for input(s): GLUCAP in the last 168 hours. D-Dimer No results for input(s): DDIMER in the last 72 hours. Hgb A1c No results for input(s): HGBA1C in the last 72 hours. Lipid Profile No results for input(s): CHOL, HDL, LDLCALC, TRIG, CHOLHDL, LDLDIRECT in the last 72 hours. Thyroid function studies  Recent Labs  12/18/16 0936  TSH 1.280   Anemia work up No results for input(s): VITAMINB12, FOLATE, FERRITIN, TIBC, IRON, RETICCTPCT in the last 72 hours. Urinalysis    Component Value Date/Time   COLORURINE AMBER (A) 12/16/2016 0730   APPEARANCEUR HAZY (A) 12/16/2016 0730   LABSPEC 1.024 12/16/2016 0730   PHURINE 6.0 12/16/2016 0730   GLUCOSEU NEGATIVE 12/16/2016 0730   HGBUR SMALL (A) 12/16/2016 0730   BILIRUBINUR NEGATIVE 12/16/2016 0730   KETONESUR 20 (A) 12/16/2016 0730   PROTEINUR 100 (A) 12/16/2016 0730   UROBILINOGEN 0.2 03/25/2011 0940   NITRITE NEGATIVE 12/16/2016 0730   LEUKOCYTESUR NEGATIVE 12/16/2016 0730   Sepsis Labs Invalid input(s): PROCALCITONIN,  WBC,  LACTICIDVEN Microbiology Recent Results (from the past 240 hour(s))  Blood culture (routine x 2)     Status: None (Preliminary result)   Collection Time: 12/16/16  6:20 AM  Result Value Ref Range Status   Specimen Description BLOOD RIGHT ANTECUBITAL  Final   Special Requests   Final    BOTTLES DRAWN AEROBIC AND ANAEROBIC Blood Culture adequate volume   Culture NO GROWTH 3 DAYS  Final   Report Status PENDING  Incomplete  Urine culture     Status: Abnormal   Collection Time: 12/16/16  7:30 AM  Result Value Ref Range Status   Specimen Description URINE, CLEAN CATCH  Final   Special Requests NONE  Final   Culture MULTIPLE SPECIES PRESENT, SUGGEST RECOLLECTION (A)   Final   Report Status 12/17/2016 FINAL  Final  Culture, blood (routine x 2) Call MD if unable to obtain prior to antibiotics being given     Status: None (Preliminary result)   Collection Time: 12/16/16  7:43 PM  Result Value Ref Range Status   Specimen Description BLOOD LEFT ANTECUBITAL  Final   Special Requests IN PEDIATRIC BOTTLE Blood Culture adequate volume  Final   Culture NO GROWTH 3 DAYS  Final   Report Status PENDING  Incomplete  Culture, body fluid-bottle     Status: None (Preliminary result)   Collection Time: 12/18/16  2:56 PM  Result Value Ref Range Status   Specimen Description FLUID LEFT PLEURAL  Final   Special Requests BOTTLES DRAWN AEROBIC AND ANAEROBIC 10CC  Final   Culture NO GROWTH < 24 HOURS  Final   Report Status PENDING  Incomplete  Gram stain     Status: None   Collection Time: 12/18/16  2:56 PM  Result Value Ref Range Status   Specimen Description FLUID LEFT PLEURAL  Final   Special Requests NONE  Final   Gram Stain   Final    ABUNDANT WBC PRESENT,BOTH PMN AND MONONUCLEAR NO ORGANISMS SEEN    Report Status 12/18/2016 FINAL  Final    Time coordinating discharge: 33 mins  SIGNED:  Standley Dakins, MD  [facility] 12/19/2016, 12:45 PM Pager [phone]  If 7PM-7AM, please contact night-coverage [email or web address] Password [email or web address]

## 2016-12-21 LAB — CULTURE, BLOOD (ROUTINE X 2)
Culture: NO GROWTH
Culture: NO GROWTH
SPECIAL REQUESTS: ADEQUATE
SPECIAL REQUESTS: ADEQUATE

## 2016-12-23 LAB — CULTURE, BODY FLUID-BOTTLE: CULTURE: NO GROWTH

## 2017-02-06 ENCOUNTER — Emergency Department (HOSPITAL_COMMUNITY): Payer: Medicaid Other

## 2017-02-06 ENCOUNTER — Inpatient Hospital Stay (HOSPITAL_COMMUNITY): Payer: Medicaid Other

## 2017-02-06 ENCOUNTER — Encounter (HOSPITAL_COMMUNITY): Payer: Self-pay

## 2017-02-06 ENCOUNTER — Inpatient Hospital Stay (HOSPITAL_COMMUNITY)
Admission: EM | Admit: 2017-02-06 | Discharge: 2017-02-09 | DRG: 871 | Disposition: A | Discharge status: Home / Self Care | Payer: Medicaid Other | Attending: Internal Medicine | Admitting: Internal Medicine

## 2017-02-06 DIAGNOSIS — B9562 Methicillin resistant Staphylococcus aureus infection as the cause of diseases classified elsewhere: Secondary | ICD-10-CM | POA: Diagnosis present

## 2017-02-06 DIAGNOSIS — G40909 Epilepsy, unspecified, not intractable, without status epilepticus: Secondary | ICD-10-CM | POA: Diagnosis present

## 2017-02-06 DIAGNOSIS — Z9889 Other specified postprocedural states: Secondary | ICD-10-CM | POA: Diagnosis not present

## 2017-02-06 DIAGNOSIS — A419 Sepsis, unspecified organism: Principal | ICD-10-CM | POA: Diagnosis present

## 2017-02-06 DIAGNOSIS — J189 Pneumonia, unspecified organism: Secondary | ICD-10-CM | POA: Diagnosis present

## 2017-02-06 DIAGNOSIS — Z7982 Long term (current) use of aspirin: Secondary | ICD-10-CM

## 2017-02-06 DIAGNOSIS — I471 Supraventricular tachycardia: Secondary | ICD-10-CM | POA: Diagnosis present

## 2017-02-06 DIAGNOSIS — R569 Unspecified convulsions: Secondary | ICD-10-CM

## 2017-02-06 DIAGNOSIS — Z6832 Body mass index (BMI) 32.0-32.9, adult: Secondary | ICD-10-CM | POA: Diagnosis not present

## 2017-02-06 DIAGNOSIS — E876 Hypokalemia: Secondary | ICD-10-CM | POA: Diagnosis present

## 2017-02-06 DIAGNOSIS — Z23 Encounter for immunization: Secondary | ICD-10-CM

## 2017-02-06 DIAGNOSIS — I313 Pericardial effusion (noninflammatory): Secondary | ICD-10-CM | POA: Diagnosis present

## 2017-02-06 DIAGNOSIS — Z8249 Family history of ischemic heart disease and other diseases of the circulatory system: Secondary | ICD-10-CM | POA: Diagnosis not present

## 2017-02-06 DIAGNOSIS — I10 Essential (primary) hypertension: Secondary | ICD-10-CM | POA: Diagnosis present

## 2017-02-06 DIAGNOSIS — J90 Pleural effusion, not elsewhere classified: Secondary | ICD-10-CM | POA: Diagnosis present

## 2017-02-06 DIAGNOSIS — R509 Fever, unspecified: Secondary | ICD-10-CM | POA: Diagnosis present

## 2017-02-06 DIAGNOSIS — Y95 Nosocomial condition: Secondary | ICD-10-CM | POA: Diagnosis present

## 2017-02-06 DIAGNOSIS — I301 Infective pericarditis: Secondary | ICD-10-CM | POA: Diagnosis not present

## 2017-02-06 DIAGNOSIS — Z8701 Personal history of pneumonia (recurrent): Secondary | ICD-10-CM | POA: Diagnosis not present

## 2017-02-06 DIAGNOSIS — R0602 Shortness of breath: Secondary | ICD-10-CM | POA: Diagnosis not present

## 2017-02-06 DIAGNOSIS — E7800 Pure hypercholesterolemia, unspecified: Secondary | ICD-10-CM | POA: Diagnosis present

## 2017-02-06 DIAGNOSIS — E669 Obesity, unspecified: Secondary | ICD-10-CM | POA: Diagnosis present

## 2017-02-06 LAB — CBC WITH DIFFERENTIAL/PLATELET
BASOS ABS: 0 10*3/uL (ref 0.0–0.1)
Basophils Relative: 0 %
EOS PCT: 0 %
Eosinophils Absolute: 0 10*3/uL (ref 0.0–0.7)
HEMATOCRIT: 35.2 % — AB (ref 36.0–46.0)
Hemoglobin: 11.5 g/dL — ABNORMAL LOW (ref 12.0–15.0)
LYMPHS PCT: 14 %
Lymphs Abs: 2.1 10*3/uL (ref 0.7–4.0)
MCH: 28.5 pg (ref 26.0–34.0)
MCHC: 32.7 g/dL (ref 30.0–36.0)
MCV: 87.3 fL (ref 78.0–100.0)
MONO ABS: 2.1 10*3/uL — AB (ref 0.1–1.0)
MONOS PCT: 14 %
NEUTROS ABS: 10.5 10*3/uL — AB (ref 1.7–7.7)
Neutrophils Relative %: 72 %
PLATELETS: 223 10*3/uL (ref 150–400)
RBC: 4.03 MIL/uL (ref 3.87–5.11)
RDW: 15.9 % — AB (ref 11.5–15.5)
WBC: 14.7 10*3/uL — ABNORMAL HIGH (ref 4.0–10.5)

## 2017-02-06 LAB — COMPREHENSIVE METABOLIC PANEL
ALBUMIN: 3.4 g/dL — AB (ref 3.5–5.0)
ALK PHOS: 84 U/L (ref 38–126)
ALT: 24 U/L (ref 14–54)
AST: 41 U/L (ref 15–41)
Anion gap: 13 (ref 5–15)
BILIRUBIN TOTAL: 1.4 mg/dL — AB (ref 0.3–1.2)
BUN: 10 mg/dL (ref 6–20)
CO2: 22 mmol/L (ref 22–32)
Calcium: 8.5 mg/dL — ABNORMAL LOW (ref 8.9–10.3)
Chloride: 100 mmol/L — ABNORMAL LOW (ref 101–111)
Creatinine, Ser: 0.9 mg/dL (ref 0.44–1.00)
GFR calc Af Amer: >60 mL/min (ref >60)
GFR calc non Af Amer: >60 mL/min (ref >60)
GLUCOSE: 115 mg/dL — AB (ref 65–99)
POTASSIUM: 2.8 mmol/L — AB (ref 3.5–5.1)
SODIUM: 135 mmol/L (ref 135–145)
Total Protein: 9.8 g/dL — ABNORMAL HIGH (ref 6.5–8.1)

## 2017-02-06 LAB — I-STAT CG4 LACTIC ACID, ED: Lactic Acid, Venous: 1.21 mmol/L (ref 0.5–1.9)

## 2017-02-06 MED ORDER — ATENOLOL 100 MG PO TABS
100.0000 mg | ORAL_TABLET | Freq: Every day | ORAL | Status: DC
  Administered 2017-02-07 – 2017-02-09 (×3): 100 mg via ORAL
  Filled 2017-02-06: qty 1
  Filled 2017-02-06: qty 2
  Filled 2017-02-06: qty 1
  Filled 2017-02-06: qty 2
  Filled 2017-02-06: qty 1
  Filled 2017-02-06: qty 2

## 2017-02-06 MED ORDER — CARBAMAZEPINE 100 MG PO CHEW
300.0000 mg | CHEWABLE_TABLET | Freq: Two times a day (BID) | ORAL | Status: DC
Start: 2017-02-06 — End: 2017-02-09
  Administered 2017-02-06 – 2017-02-09 (×6): 300 mg via ORAL
  Filled 2017-02-06 (×7): qty 3

## 2017-02-06 MED ORDER — ACETAMINOPHEN 650 MG RE SUPP: 650.0000 mg | Freq: Four times a day (QID) | RECTAL | Status: DC | PRN

## 2017-02-06 MED ORDER — VANCOMYCIN HCL IN DEXTROSE 1-5 GM/200ML-% IV SOLN
1000.0000 mg | Freq: Two times a day (BID) | INTRAVENOUS | Status: DC
Start: 2017-02-07 — End: 2017-02-09
  Administered 2017-02-07 – 2017-02-09 (×4): 1000 mg via INTRAVENOUS
  Filled 2017-02-06 (×3): qty 200

## 2017-02-06 MED ORDER — INFLUENZA VAC SPLIT QUAD 0.5 ML IM SUSY: 0.5000 mL | PREFILLED_SYRINGE | INTRAMUSCULAR | Status: DC

## 2017-02-06 MED ORDER — ONDANSETRON HCL 4 MG PO TABS: 4.0000 mg | ORAL_TABLET | Freq: Four times a day (QID) | ORAL | Status: DC | PRN

## 2017-02-06 MED ORDER — AMLODIPINE BESYLATE 5 MG PO TABS
5.0000 mg | ORAL_TABLET | Freq: Every day | ORAL | Status: DC
  Administered 2017-02-07 – 2017-02-09 (×3): 5 mg via ORAL
  Filled 2017-02-06 (×3): qty 1

## 2017-02-06 MED ORDER — SODIUM CHLORIDE 0.9 % IV BOLUS (SEPSIS)
1000.0000 mL | Freq: Once | INTRAVENOUS | Status: AC
  Administered 2017-02-06: 1000 mL via INTRAVENOUS

## 2017-02-06 MED ORDER — POTASSIUM CHLORIDE 10 MEQ/100ML IV SOLN
10.0000 meq | INTRAVENOUS | Status: AC
  Administered 2017-02-06 (×2): 10 meq via INTRAVENOUS
  Filled 2017-02-06 (×2): qty 100

## 2017-02-06 MED ORDER — DEXTROSE 5 % IV SOLN
1.0000 g | Freq: Three times a day (TID) | INTRAVENOUS | Status: DC
  Administered 2017-02-07 – 2017-02-09 (×7): 1 g via INTRAVENOUS
  Filled 2017-02-06 (×9): qty 1

## 2017-02-06 MED ORDER — SODIUM CHLORIDE 0.9 % IV SOLN
INTRAVENOUS | Status: AC
  Administered 2017-02-06 – 2017-02-07 (×2): via INTRAVENOUS

## 2017-02-06 MED ORDER — ACETAMINOPHEN 325 MG PO TABS
650.0000 mg | ORAL_TABLET | Freq: Four times a day (QID) | ORAL | Status: DC | PRN
  Administered 2017-02-07 – 2017-02-08 (×4): 650 mg via ORAL
  Filled 2017-02-06 (×4): qty 2

## 2017-02-06 MED ORDER — PIPERACILLIN-TAZOBACTAM 3.375 G IVPB 30 MIN
3.3750 g | Freq: Once | INTRAVENOUS | Status: AC
  Administered 2017-02-06: 3.375 g via INTRAVENOUS
  Filled 2017-02-06: qty 50

## 2017-02-06 MED ORDER — ALBUTEROL SULFATE (2.5 MG/3ML) 0.083% IN NEBU: 5.0000 mg | INHALATION_SOLUTION | Freq: Once | RESPIRATORY_TRACT | Status: DC

## 2017-02-06 MED ORDER — FERROUS SULFATE 325 (65 FE) MG PO TABS
325.0000 mg | ORAL_TABLET | Freq: Every day | ORAL | Status: DC
  Administered 2017-02-07 – 2017-02-09 (×3): 325 mg via ORAL
  Filled 2017-02-06 (×3): qty 1

## 2017-02-06 MED ORDER — VANCOMYCIN HCL IN DEXTROSE 1-5 GM/200ML-% IV SOLN
1000.0000 mg | Freq: Once | INTRAVENOUS | Status: AC
  Administered 2017-02-06: 1000 mg via INTRAVENOUS
  Filled 2017-02-06: qty 200

## 2017-02-06 MED ORDER — POTASSIUM CHLORIDE CRYS ER 20 MEQ PO TBCR
60.0000 meq | EXTENDED_RELEASE_TABLET | Freq: Once | ORAL | Status: AC
  Administered 2017-02-06: 60 meq via ORAL
  Filled 2017-02-06: qty 3

## 2017-02-06 MED ORDER — ONDANSETRON HCL 4 MG/2ML IJ SOLN: 4.0000 mg | Freq: Four times a day (QID) | INTRAMUSCULAR | Status: DC | PRN

## 2017-02-06 MED ORDER — ACETAMINOPHEN 325 MG PO TABS
650.0000 mg | ORAL_TABLET | Freq: Once | ORAL | Status: DC | PRN
  Filled 2017-02-06: qty 2

## 2017-02-06 NOTE — Progress Notes (Signed)
A consult was received from an ED physician for vancomycin and Zosyn per pharmacy dosing.  The patient's profile has been reviewed for ht/wt/allergies/indication/available labs.   A one time order has been placed for vancomycin and Zosyn.  Further antibiotics/pharmacy consults should be ordered by admitting physician if indicated.                       Thank you,  Bernadene Person, PharmD, BCPS Pager: [phone] 02/06/2017, 6:27 PM

## 2017-02-06 NOTE — H&P (Addendum)
History and Physical    Amanda Lozano ZOX:[id number] DOB: 1971/04/01 DOA: 02/06/2017  PCP: [name], MD  Patient coming from: Home.  Chief Complaint: Shortness of breath.  HPI: Amanda Lozano is a 46 y.o. female with history of hypertension and seizures who was admitted in August 2018 for pneumonia and at that time patient also had thoracentesis presents to the ER because of worsening shortness of breath over the last 24 hours. Denies any chest pain fever chills productive cough or any hemoptysis. Patient states patient's shortness of breath increases on lying down.   ED Course: In the ER patient was febrile with temperatures around 102F and tachycardic chest x-ray showing bilateral effusion more on the left side congestion and consolidation. Blood cultures were obtained and patient was placed on sepsis protocol and started on empiric antibiotics for healthcare associated pneumonia.  Review of Systems: As per HPI, rest all negative.   Past Medical History:  Diagnosis Date  . High cholesterol   . History of carpal tunnel syndrome   . Hypertension   . Seizures (HCC)    Last seizure "years ago"    Past Surgical History:  Procedure Laterality Date  . CESAREAN SECTION  09/11/2011   Procedure: CESAREAN SECTION;  Surgeon: Brock Bad, MD;  Location: [facility];  Service: Gynecology;  Laterality: N/A;  . IR THORACENTESIS ASP PLEURAL SPACE W/IMG GUIDE  12/18/2016     reports that she has never smoked. She has never used smokeless tobacco. She reports that she does not drink alcohol or use drugs.  No Known Allergies  Family History  Problem Relation Age of Onset  . Hypertension Father   . Seizures Paternal Grandmother     Prior to Admission medications   Medication Sig Start Date End Date Taking? Authorizing Provider  amLODipine (NORVASC) 5 MG tablet Take 1 tablet (5 mg total) by mouth daily. 11/18/16  Yes Smith, [place], CNM  aspirin EC 81 MG tablet Take 81 mg by  mouth every 4 (four) hours as needed (HA).   Yes [provider]  atenolol (TENORMIN) 100 MG tablet Take 1 tablet (100 mg total) by mouth daily. 11/18/16  Yes Smith, [place], CNM  carbamazepine (TEGRETOL) 100 MG chewable tablet CHEW AND SWALLOW 3 TABLETS BY MOUTH TWICE DAILY 05/25/16  Yes Jaffe, Adam R, DO  ferrous sulfate 325 (65 FE) MG tablet Take 325 mg by mouth daily with breakfast.   Yes [provider]    Physical Exam: Vitals:   02/06/17 1728 02/06/17 1817 02/06/17 1856  BP: (!) 141/88 (!) 147/99 (!) 147/99  Pulse: (!) 129 (!) 118 (!) 118  Resp: (!) 41 16 (!) 30  Temp: (!) 102.8 F (39.3 C)    TempSrc: Oral    SpO2: 97% 98% 99%      Constitutional: Moderately built and nourished. Vitals:   02/06/17 1728 02/06/17 1817 02/06/17 1856  BP: (!) 141/88 (!) 147/99 (!) 147/99  Pulse: (!) 129 (!) 118 (!) 118  Resp: (!) 41 16 (!) 30  Temp: (!) 102.8 F (39.3 C)    TempSrc: Oral    SpO2: 97% 98% 99%   Eyes: Anicteric no pallor. ENMT: No discharge from the ears eyes nose and mouth. Neck: No mass felt. No neck rigidity. No JVD appreciated. Respiratory: No rhonchi or crepitations. Cardiovascular: S1 and S2 heard no murmurs appreciated. Abdomen: Soft nontender bowel sounds present. Musculoskeletal: No edema. No joint effusion. Skin: No rash. Skin appears warm. Neurologic: Alert awake oriented to  time place and person. Moves all extremities. Psychiatric: Appears normal. Normal affect.   Labs on Admission: I have personally reviewed following labs and imaging studies  CBC:  Recent Labs Lab 02/06/17 1835  WBC 14.7*  NEUTROABS 10.5*  HGB 11.5*  HCT 35.2*  MCV 87.3  PLT 223   Basic Metabolic Panel:  Recent Labs Lab 02/06/17 1835  NA 135  K 2.8*  CL 100*  CO2 22  GLUCOSE 115*  BUN 10  CREATININE 0.90  CALCIUM 8.5*   GFR: CrCl cannot be calculated (Unknown ideal weight.). Liver Function Tests:  Recent Labs Lab 02/06/17 1835  AST 41  ALT  24  ALKPHOS 84  BILITOT 1.4*  PROT 9.8*  ALBUMIN 3.4*   No results for input(s): LIPASE, AMYLASE in the last 168 hours. No results for input(s): AMMONIA in the last 168 hours. Coagulation Profile: No results for input(s): INR, PROTIME in the last 168 hours. Cardiac Enzymes: No results for input(s): CKTOTAL, CKMB, CKMBINDEX, TROPONINI in the last 168 hours. BNP (last 3 results) No results for input(s): PROBNP in the last 8760 hours. HbA1C: No results for input(s): HGBA1C in the last 72 hours. CBG: No results for input(s): GLUCAP in the last 168 hours. Lipid Profile: No results for input(s): CHOL, HDL, LDLCALC, TRIG, CHOLHDL, LDLDIRECT in the last 72 hours. Thyroid Function Tests: No results for input(s): TSH, T4TOTAL, FREET4, T3FREE, THYROIDAB in the last 72 hours. Anemia Panel: No results for input(s): VITAMINB12, FOLATE, FERRITIN, TIBC, IRON, RETICCTPCT in the last 72 hours. Urine analysis:    Component Value Date/Time   COLORURINE AMBER (A) 12/16/2016 0730   APPEARANCEUR HAZY (A) 12/16/2016 0730   LABSPEC 1.024 12/16/2016 0730   PHURINE 6.0 12/16/2016 0730   GLUCOSEU NEGATIVE 12/16/2016 0730   HGBUR SMALL (A) 12/16/2016 0730   BILIRUBINUR NEGATIVE 12/16/2016 0730   KETONESUR 20 (A) 12/16/2016 0730   PROTEINUR 100 (A) 12/16/2016 0730   UROBILINOGEN 0.2 03/25/2011 0940   NITRITE NEGATIVE 12/16/2016 0730   LEUKOCYTESUR NEGATIVE 12/16/2016 0730   Sepsis Labs: (procalcitonin:4,lacticidven:4) )No results found for this or any previous visit (from the past 240 hour(s)).   Radiological Exams on Admission: Dg Chest 2 View  Result Date: 02/06/2017 CLINICAL DATA:  Shortness of breath EXAM: CHEST  2 VIEW COMPARISON:  12/18/2016 FINDINGS: Moderate left pleural effusion with some fluid extending along the fissure. Tiny right pleural effusion. Patchy atelectasis or infiltrate at the right base with persistent consolidation at the left lung base. Cardiomegaly with central  vascular congestion. No pneumothorax. IMPRESSION: 1. Moderate left pleural effusion.  Small right pleural effusion 2. Persisting consolidation at the left lung base may reflect atelectasis or pneumonia. Hazy right infrahilar atelectasis or infiltrate 3. Cardiomegaly with central vascular congestion Electronically Signed   By: Jasmine Pang M.D.   On: 02/06/2017 19:20    EKG: Independently reviewed. Sinus tachycardia.  Assessment/Plan Principal Problem:   Sepsis (HCC) Active Problems:   Convulsions/seizures (HCC)   Benign essential HTN   HCAP (healthcare-associated pneumonia)    1. Sepsis from recurrent pneumonia - patient has been placed on vancomycin and cefepime for healthcare associated pneumonia. Check blood cultures urine for Legionella and strep antigen influenza PCR. I have ordered CT of the chest without contrast to reevaluate her pleural effusion. 2. Hypertension on amlodipine and atenolol which will be continued. 3. Seizures on Tegretol. 4. History of iron deficiency anemia and supplements.  I have reviewed patient's old charts and labs.  Addendum - CT chest shows  moderate pericardial effusion with possible proteinaceous material. I have ordered 2-D echo and requested cardiology consult. I have also ordered CRP BNP troponin and sedimentation rate. Follow HIV status.   DVT prophylaxis: SCDs since patient may need thoracentesis. Code Status: Full code.  Family Communication: Discussed with patient.  Disposition Plan: To be determined.  Consults called: Cardiology.  Admission status: Inpatient.    Eduard Clos MD [facility] Pager [phone].  If 7PM-7AM, please contact night-coverage [email or web address] Password [email or web address]  02/06/2017, 9:16 PM

## 2017-02-06 NOTE — ED Notes (Signed)
Call report to [place], RN at [facility]   [phone]

## 2017-02-06 NOTE — ED Triage Notes (Addendum)
Pt c/o SOB. She had to have a thoracentesis in August of this year to remove fluid from her lungs. She states that she feels the same as she did then. Fever, Tachypnea and tachycardia noted in triage. Denies chest pain. A&Ox4.

## 2017-02-06 NOTE — ED Provider Notes (Signed)
[facility]-EMERGENCY DEPT Provider Note   CSN: [id number] Arrival date & time: 02/06/17  1714     History   Chief Complaint Chief Complaint  Patient presents with  . Shortness of Breath    HPI Amanda Lozano is a 46 y.o. female.  HPI 46 year old female recently admitted for pneumonia resulting in pleural effusions requiring thoracentesis presents to the emergency department with several days of worsening shortness of breath and one day of fever with mild nonproductive cough. Shortness of breath was gradual onset, and worsening over the past several days. Exacerbated with activity. No alleviating factors. No associated chest pain, emesis, abdominal pain, lower extremity edema.   Patient reports that her daughter recently had an upper respiratory infection earlier this week. No other known sick contacts.  Patient was evaluated by her PCP today who recommended coming to the ED for evaluation.  Patient denies any other physical complaints. No other alleviating or aggravating factors. Past Medical History:  Diagnosis Date  . High cholesterol   . History of carpal tunnel syndrome   . Hypertension   . Seizures (HCC)    Last seizure "years ago"    Patient Active Problem List   Diagnosis Date Noted  . Benign essential HTN 12/17/2016  . Sepsis (HCC) 12/17/2016  . LLL pneumonia (HCC) 12/16/2016  . Pleural effusion 12/16/2016  . 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 Depression 11/17/2013  . Staring spell 08/04/2013  . Convulsions/seizures (HCC) 11/10/2012  . Elderly multigravida with antepartum condition or complication 03/15/2011    Past Surgical History:  Procedure Laterality Date  . CESAREAN SECTION  09/11/2011   Procedure: CESAREAN SECTION;  Surgeon: Brock Bad, MD;  Location: [facility];  Service: Gynecology;  Laterality: N/A;  . IR THORACENTESIS ASP PLEURAL SPACE W/IMG GUIDE  12/18/2016    OB History    Gravida Para Term Preterm AB Living   0 1 4   SAB TAB Ectopic Multiple Live Births   1 0 0 0 4      Obstetric Comments   Previous CS for breech        Home Medications    Prior to Admission medications   Medication Sig Start Date End Date Taking? Authorizing Provider  amLODipine (NORVASC) 5 MG tablet Take 1 tablet (5 mg total) by mouth daily. 11/18/16  Yes Smith, [place], CNM  aspirin EC 81 MG tablet Take 81 mg by mouth every 4 (four) hours as needed (HA).   Yes [provider]  atenolol (TENORMIN) 100 MG tablet Take 1 tablet (100 mg total) by mouth daily. 11/18/16  Yes Smith, [place], CNM  carbamazepine (TEGRETOL) 100 MG chewable tablet CHEW AND SWALLOW 3 TABLETS BY MOUTH TWICE DAILY 05/25/16  Yes Jaffe, Adam R, DO  ferrous sulfate 325 (65 FE) MG tablet Take 325 mg by mouth daily with breakfast.   Yes [provider]    Family History Family History  Problem Relation Age of Onset  . Hypertension Father   . Seizures Paternal Grandmother     Social History Social History  Substance Use Topics  . Smoking status: Never Smoker  . Smokeless tobacco: Never Used  . Alcohol use No     Allergies   Patient has no known allergies.   Review of Systems Review of Systems All other systems are reviewed and are negative for acute change except as noted in the HPI   Physical Exam Updated Vital Signs BP Marland Kitchen)  147/99   Pulse (!) 118   Temp (!) 102.8 F (39.3 C) (Oral)   Resp (!) 30   LMP 01/24/2017   SpO2 99%   Physical Exam  Constitutional: She is oriented to person, place, and time. She appears well-developed and well-nourished. No distress.  HENT:  Head: Normocephalic and atraumatic.  Nose: Nose normal.  Eyes: Pupils are equal, round, and reactive to light. Conjunctivae and EOM are normal. Right eye exhibits no discharge. Left eye exhibits no discharge. No scleral icterus.  Neck: Normal range of motion. Neck supple.  Cardiovascular: Regular rhythm.   Tachycardia present.  Exam reveals no gallop and no friction rub.   No murmur heard. Pulmonary/Chest: Effort normal. No stridor. No respiratory distress. She has decreased breath sounds in the left middle field and the left lower field. She has no rales.  Abdominal: Soft. She exhibits no distension. There is no tenderness.  Musculoskeletal: She exhibits no edema or tenderness.  Neurological: She is alert and oriented to person, place, and time.  Skin: Skin is warm and dry. No rash noted. She is not diaphoretic. No erythema.  Psychiatric: She has a normal mood and affect.  Vitals reviewed.    ED Treatments / Results  Labs (all labs ordered are listed, but only abnormal results are displayed) Labs Reviewed  CBC WITH DIFFERENTIAL/PLATELET - Abnormal; Notable for the following:       Result Value   WBC 14.7 (*)    Hemoglobin 11.5 (*)    HCT 35.2 (*)    RDW 15.9 (*)    Neutro Abs 10.5 (*)    Monocytes Absolute 2.1 (*)    All other components within normal limits  CULTURE, BLOOD (ROUTINE X 2)  CULTURE, BLOOD (ROUTINE X 2)  COMPREHENSIVE METABOLIC PANEL  URINALYSIS, ROUTINE W REFLEX MICROSCOPIC  I-STAT CG4 LACTIC ACID, ED    EKG  EKG Interpretation  Date/Time:  Wednesday February 06 2017 17:32:49 EDT Ventricular Rate:  127 PR Interval:    QRS Duration: 110 QT Interval:  422 QTC Calculation: 614 R Axis:   79 Text Interpretation:  Sinus tachycardia LAE, consider biatrial enlargement Nonspecific repol abnormality, lateral leads ST elev, probable normal early repol pattern Prolonged QT interval NO STEMI Confirmed by Drema Pry [phone]) on 02/06/2017 7:16:36 PM       Radiology Dg Chest 2 View  Result Date: 02/06/2017 CLINICAL DATA:  Shortness of breath EXAM: CHEST  2 VIEW COMPARISON:  12/18/2016 FINDINGS: Moderate left pleural effusion with some fluid extending along the fissure. Tiny right pleural effusion. Patchy atelectasis or infiltrate at the right base with persistent  consolidation at the left lung base. Cardiomegaly with central vascular congestion. No pneumothorax. IMPRESSION: 1. Moderate left pleural effusion.  Small right pleural effusion 2. Persisting consolidation at the left lung base may reflect atelectasis or pneumonia. Hazy right infrahilar atelectasis or infiltrate 3. Cardiomegaly with central vascular congestion Electronically Signed   By: Jasmine Pang M.D.   On: 02/06/2017 19:20    Procedures Procedures (including critical care time)  Medications Ordered in ED Medications  acetaminophen (TYLENOL) tablet 650 mg (not administered)  piperacillin-tazobactam (ZOSYN) IVPB 3.375 g (3.375 g Intravenous New Bag/Given 02/06/17 1910)  vancomycin (VANCOCIN) IVPB 1000 mg/200 mL premix (not administered)  sodium chloride 0.9 % bolus 1,000 mL (1,000 mLs Intravenous New Bag/Given 02/06/17 1857)     Initial Impression / Assessment and Plan / ED Course  I have reviewed the triage vital signs and the nursing notes.  Pertinent  labs & imaging results that were available during my care of the patient were reviewed by me and considered in my medical decision making (see chart for details).     Patient is febrile, tachycardic, increased respiratory rate,normotensive. Given the patient's recent history of pneumonia and, code sepsis was initiated. She was started on emperic antibiotic for unknown source. Patient is hemodynamically stable. Lactic acid within normal limits. Given a 1 L IV fluid bolus. Does not require 30 mL/kg of IV fluid bolus.  Chest x-ray with increased left pleural effusion with possible pneumonia.   Will admit the patient for further workup and management.  Final Clinical Impressions(s) / ED Diagnoses   Final diagnoses:  Sepsis, due to unspecified organism (HCC)  Recurrent left pleural effusion  HCAP (healthcare-associated pneumonia)      Cardama, Amadeo Garnet, MD 02/06/17 1945

## 2017-02-06 NOTE — Progress Notes (Signed)
Pharmacy Antibiotic Note  Amanda Lozano is a 46 y.o. female admitted on 02/06/2017 with HCAP.  Pharmacy has been consulted for vancomycin dosing. Cefepime per MD.  Plan: Cefepime 1 Gm IV q8h Vancomycin 1 Gm IV q12h VT=15-20 mg/L F/u scr/cultures/levels     Temp (24hrs), Avg:102.8 F (39.3 C), Min:102.8 F (39.3 C), Max:102.8 F (39.3 C)   Recent Labs Lab 02/06/17 1834 02/06/17 1835  WBC  --  14.7*  CREATININE  --  0.90  LATICACIDVEN 1.21  --     CrCl cannot be calculated (Unknown ideal weight.).    No Known Allergies  Antimicrobials this admission: 10/3 zosyn >> x1 ED 10/3 cefepime >>  10/3 vancomycin >>  Dose adjustments this admission:   Microbiology results:  BCx:   UCx:    Sputum:    MRSA PCR:   Thank you for allowing pharmacy to be a part of this patient's care.  Lorenza Evangelist 02/06/2017 10:10 PM

## 2017-02-07 ENCOUNTER — Inpatient Hospital Stay (HOSPITAL_COMMUNITY): Payer: Medicaid Other

## 2017-02-07 DIAGNOSIS — A419 Sepsis, unspecified organism: Principal | ICD-10-CM

## 2017-02-07 DIAGNOSIS — J189 Pneumonia, unspecified organism: Secondary | ICD-10-CM

## 2017-02-07 DIAGNOSIS — I313 Pericardial effusion (noninflammatory): Secondary | ICD-10-CM

## 2017-02-07 DIAGNOSIS — I301 Infective pericarditis: Secondary | ICD-10-CM

## 2017-02-07 DIAGNOSIS — R0602 Shortness of breath: Secondary | ICD-10-CM

## 2017-02-07 DIAGNOSIS — R569 Unspecified convulsions: Secondary | ICD-10-CM

## 2017-02-07 DIAGNOSIS — I10 Essential (primary) hypertension: Secondary | ICD-10-CM

## 2017-02-07 DIAGNOSIS — J90 Pleural effusion, not elsewhere classified: Secondary | ICD-10-CM

## 2017-02-07 LAB — BODY FLUID CELL COUNT WITH DIFFERENTIAL
Eos, Fluid: 0 %
LYMPHS FL: 9 %
Monocyte-Macrophage-Serous Fluid: 26 % — ABNORMAL LOW (ref 50–90)
Neutrophil Count, Fluid: 65 % — ABNORMAL HIGH (ref 0–25)
Total Nucleated Cell Count, Fluid: 1216 cu mm — ABNORMAL HIGH (ref 0–1000)

## 2017-02-07 LAB — INFLUENZA PANEL BY PCR (TYPE A & B)
INFLBPCR: NEGATIVE
Influenza A By PCR: NEGATIVE

## 2017-02-07 LAB — URINALYSIS, ROUTINE W REFLEX MICROSCOPIC
BACTERIA UA: NONE SEEN
Bilirubin Urine: NEGATIVE
Glucose, UA: NEGATIVE mg/dL
KETONES UR: 20 mg/dL — AB
Leukocytes, UA: NEGATIVE
Nitrite: NEGATIVE
PH: 6 (ref 5.0–8.0)
PROTEIN: 30 mg/dL — AB
Specific Gravity, Urine: 1.011 (ref 1.005–1.030)

## 2017-02-07 LAB — ALBUMIN, PLEURAL OR PERITONEAL FLUID: ALBUMIN FL: 2.4 g/dL

## 2017-02-07 LAB — BASIC METABOLIC PANEL
ANION GAP: 9 (ref 5–15)
BUN: 6 mg/dL (ref 6–20)
CALCIUM: 8.1 mg/dL — AB (ref 8.9–10.3)
CO2: 21 mmol/L — AB (ref 22–32)
Chloride: 109 mmol/L (ref 101–111)
Creatinine, Ser: 0.6 mg/dL (ref 0.44–1.00)
GFR calc Af Amer: >60 mL/min (ref >60)
GLUCOSE: 115 mg/dL — AB (ref 65–99)
Potassium: 4 mmol/L (ref 3.5–5.1)
Sodium: 139 mmol/L (ref 135–145)

## 2017-02-07 LAB — TROPONIN I: Troponin I: <0.03 ng/mL (ref <0.03)

## 2017-02-07 LAB — CBC
HCT: 34 % — ABNORMAL LOW (ref 36.0–46.0)
HEMOGLOBIN: 10.8 g/dL — AB (ref 12.0–15.0)
MCH: 27.4 pg (ref 26.0–34.0)
MCHC: 31.8 g/dL (ref 30.0–36.0)
MCV: 86.3 fL (ref 78.0–100.0)
Platelets: 259 10*3/uL (ref 150–400)
RBC: 3.94 MIL/uL (ref 3.87–5.11)
RDW: 16 % — AB (ref 11.5–15.5)
WBC: 11.3 10*3/uL — ABNORMAL HIGH (ref 4.0–10.5)

## 2017-02-07 LAB — ECHOCARDIOGRAM COMPLETE
Height: 62 in
Weight: 2860.69 oz

## 2017-02-07 LAB — GLUCOSE, PLEURAL OR PERITONEAL FLUID: GLUCOSE FL: 122 mg/dL

## 2017-02-07 LAB — PROTEIN, PLEURAL OR PERITONEAL FLUID: TOTAL PROTEIN, FLUID: 5.9 g/dL

## 2017-02-07 LAB — SEDIMENTATION RATE: Sed Rate: 107 mm/hr — ABNORMAL HIGH (ref 0–22)

## 2017-02-07 LAB — C-REACTIVE PROTEIN: CRP: 31.4 mg/dL — ABNORMAL HIGH (ref <1.0)

## 2017-02-07 LAB — LACTATE DEHYDROGENASE, PLEURAL OR PERITONEAL FLUID: LD FL: 121 U/L — AB (ref 3–23)

## 2017-02-07 LAB — MRSA PCR SCREENING: MRSA BY PCR: POSITIVE — AB

## 2017-02-07 LAB — STREP PNEUMONIAE URINARY ANTIGEN: STREP PNEUMO URINARY ANTIGEN: NEGATIVE

## 2017-02-07 MED ORDER — METOPROLOL TARTRATE 5 MG/5ML IV SOLN: 5.0000 mg | Freq: Four times a day (QID) | INTRAVENOUS | Status: DC | PRN

## 2017-02-07 MED ORDER — LIDOCAINE HCL 2 % IJ SOLN
INTRAMUSCULAR | Status: AC
  Filled 2017-02-07: qty 10

## 2017-02-07 MED ORDER — CHLORHEXIDINE GLUCONATE CLOTH 2 % EX PADS
6.0000 | MEDICATED_PAD | Freq: Every day | CUTANEOUS | Status: DC
  Administered 2017-02-07 – 2017-02-09 (×3): 6 via TOPICAL

## 2017-02-07 MED ORDER — MUPIROCIN 2 % EX OINT
1.0000 "application " | TOPICAL_OINTMENT | Freq: Two times a day (BID) | CUTANEOUS | Status: DC
  Administered 2017-02-07 – 2017-02-09 (×6): 1 via NASAL
  Filled 2017-02-07: qty 22

## 2017-02-07 NOTE — Progress Notes (Signed)
Patient will have seizure pads placed on side rails for History of Convulsions/seizures.

## 2017-02-07 NOTE — Consult Note (Signed)
Cardiology Consult    Patient ID: Amanda Lozano; [id number]; [birth year]   Admit date: 02/06/2017 Date of Consult: 02/07/2017  Primary Care Provider: Benito Mccreedy, MD Primary Cardiologist: New to [facility] - Dr. Sallyanne Kuster   Patient Profile    Amanda Lozano is a 46 y.o. female with past medical history of seizure disorder, HTN, and recent pleural effusion (s/p thoracentesis in 12/2016) who is being seen today for the evaluation of a pericardial effusion at the request of Dr. Hal Hope.   History of Present Illness    Amanda Lozano was recently admitted to [facility] in 12/2016 for a LLL PNA and was found to have a left pleural effusion, requiring thoracentesis on 12/18/2016 with 0.4L of fluid removed.   She presented back to [facility] ED on 02/06/2017 reporting a fever, chills, and dyspnea. While in the ED, she was noted to be febrile with a temp of 102.8 and tachycardiac with HR of 128. She reports her symptoms feel identical to when she required hospitalization in 12/2016. Her dyspnea and fever started two days prior. She denies any chest pain, palpitations, orthopnea, PND, or lower extremity edema. No prior cardiac history and she is unaware of any family history of CAD. She goes to the gym multiple times per week and denies any anginal symptoms with this.   Initial labs showed WBC of 14.7, Hgb 11.5, platelets 223, K+ 2.8, and creatinine 0.90. CXR showed a moderate left pleural effusion and persistent consolidation along the left base with cardiomegaly and central vascular congestion. CT Chest with a moderate pericardial effusion measuring up to 15 mm and slightly dense, concerning for possible hemorrhagic or proteinaceous effusion and moderate left pleural effusion. EKG shows sinus tachycardia, HR 127, with ST abnormalities along the lateral leads.   She has been admitted for sepsis secondary to recurrent PNA and started on Vancomycin and Cefepime. HR has improved into the low-100's and most  recent temp was at 99.4.   Past Medical History   Past Medical History:  Diagnosis Date  . High cholesterol   . History of carpal tunnel syndrome   . Hypertension   . Seizures ([place])    Last seizure "years ago"     Allergies:   No Known Allergies  Home Medications:   Home Medications:  Prior to Admission medications   Medication Sig Start Date End Date Taking? Authorizing Provider  amLODipine (NORVASC) 5 MG tablet Take 1 tablet (5 mg total) by mouth daily. 11/18/16  Yes Smith, [place], CNM  aspirin EC 81 MG tablet Take 81 mg by mouth every 4 (four) hours as needed (HA).   Yes [provider]  atenolol (TENORMIN) 100 MG tablet Take 1 tablet (100 mg total) by mouth daily. 11/18/16  Yes Smith, [place], CNM  carbamazepine (TEGRETOL) 100 MG chewable tablet CHEW AND SWALLOW 3 TABLETS BY MOUTH TWICE DAILY 05/25/16  Yes Jaffe, Adam R, DO  ferrous sulfate 325 (65 FE) MG tablet Take 325 mg by mouth daily with breakfast.   Yes [provider]    Inpatient Medications    Scheduled Meds: . amLODipine  5 mg Oral Daily  . atenolol  100 mg Oral Daily  . carbamazepine  300 mg Oral BID  . Chlorhexidine Gluconate Cloth  6 each Topical Q0600  . ferrous sulfate  325 mg Oral Q breakfast  . Influenza vac split quadrivalent PF  0.5 mL Intramuscular Tomorrow-1000  . mupirocin ointment  1 application Nasal BID   Continuous Infusions: . sodium  chloride 125 mL/hr at 02/06/17 2151  . ceFEPime (MAXIPIME) IV Stopped (02/07/17 0211)  . vancomycin     PRN Meds: acetaminophen **OR** acetaminophen, ondansetron **OR** ondansetron (ZOFRAN) IV  Family History    Family History  Problem Relation Age of Onset  . Hypertension Father   . Seizures Paternal Grandmother     Social History    Social History   Social History  . Marital status: Divorced    Spouse name: N/A  . Number of children: N/A  . Years of education: N/A   Occupational History  . Not on file.   Social  History Main Topics  . Smoking status: Never Smoker  . Smokeless tobacco: Never Used  . Alcohol use No  . Drug use: No  . Sexual activity: Not Currently    Partners: Male    Birth control/ protection: Abstinence   Other Topics Concern  . Not on file   Social History Narrative  . No narrative on file     Review of Systems    General:  No night sweats or weight changes. Positive for fever and chills. Cardiovascular:  No chest pain, edema, orthopnea, palpitations, paroxysmal nocturnal dyspnea. Positive for dyspnea.  Dermatological: No rash, lesions/masses Respiratory: Positive for productive cough and dyspnea. Urologic: No hematuria, dysuria Abdominal:   No nausea, vomiting, diarrhea, bright red blood per rectum, melena, or hematemesis Neurologic:  No visual changes, wkns, changes in mental status. All other systems reviewed and are otherwise negative except as noted above.  Physical Exam/Data    Blood pressure (!) 158/97, pulse (!) 122, temperature 99.4 F (37.4 C), temperature source Oral, resp. rate 20, height '5\' 2"'  (1.575 m), weight 178 lb 12.7 oz (81.1 kg), last menstrual period 01/24/2017, SpO2 100 %, unknown if currently breastfeeding.  General: Pleasant, African American female appearing in NAD Psych: Normal affect. Neuro: Alert and oriented X 3. Moves all extremities spontaneously. HEENT: Normal  Neck: Supple without bruits. Neck veins do not appear distended. Lungs:  Resp regular and unlabored, CTA without wheezing or rales. Heart: RRR no s3, s4, or murmurs. Abdomen: Soft, non-tender, non-distended, BS + x 4.  Extremities: No clubbing, cyanosis or edema. DP/PT/Radials 2+ and equal bilaterally.   EKG:  The EKG was personally reviewed and demonstrates: Sinus tachycardia, HR 127, with ST abnormalities along the lateral leads.   Telemetry:  Telemetry was personally reviewed and demonstrates: Sinus tachycardia, HR in 110's - 120's.    Labs/Studies     Relevant CV  Studies:  Echocardiogram: Pending  Laboratory Data:  Chemistry Recent Labs Lab 02/06/17 1835 02/07/17 0441  NA 135 139  K 2.8* 4.0  CL 100* 109  CO2 22 21*  GLUCOSE 115* 115*  BUN 10 6  CREATININE 0.90 0.60  CALCIUM 8.5* 8.1*  GFRNONAA >60 >60  GFRAA >60 >60  ANIONGAP 13 9     Recent Labs Lab 02/06/17 1835  PROT 9.8*  ALBUMIN 3.4*  AST 41  ALT 24  ALKPHOS 84  BILITOT 1.4*   Hematology Recent Labs Lab 02/06/17 1835 02/07/17 0441  WBC 14.7* 11.3*  RBC 4.03 3.94  HGB 11.5* 10.8*  HCT 35.2* 34.0*  MCV 87.3 86.3  MCH 28.5 27.4  MCHC 32.7 31.8  RDW 15.9* 16.0*  PLT 223 259   Cardiac EnzymesNo results for input(s): TROPONINI in the last 168 hours. No results for input(s): TROPIPOC in the last 168 hours.  BNPNo results for input(s): BNP, PROBNP in the last 168 hours.  DDimer  No results for input(s): DDIMER in the last 168 hours.  Radiology/Studies:  Dg Chest 2 View  Result Date: 02/06/2017 CLINICAL DATA:  Shortness of breath EXAM: CHEST  2 VIEW COMPARISON:  12/18/2016 FINDINGS: Moderate left pleural effusion with some fluid extending along the fissure. Tiny right pleural effusion. Patchy atelectasis or infiltrate at the right base with persistent consolidation at the left lung base. Cardiomegaly with central vascular congestion. No pneumothorax. IMPRESSION: 1. Moderate left pleural effusion.  Small right pleural effusion 2. Persisting consolidation at the left lung base may reflect atelectasis or pneumonia. Hazy right infrahilar atelectasis or infiltrate 3. Cardiomegaly with central vascular congestion Electronically Signed   By: Donavan Foil M.D.   On: 02/06/2017 19:20   Ct Chest Wo Contrast  Result Date: 02/06/2017 CLINICAL DATA:  Worsening shortness of breath EXAM: CT CHEST WITHOUT CONTRAST TECHNIQUE: Multidetector CT imaging of the chest was performed following the standard protocol without IV contrast. COMPARISON:  Radiograph 02/06/2017 FINDINGS:  Cardiovascular: Limited evaluation without intravenous contrast. Nonaneurysmal aorta. Mild cardiomegaly. Moderate pericardial effusion measuring up to 15 mm in thickness on the left side. Pericardial fluid is slightly dense. Mediastinum/Nodes: Midline trachea. No thyroid mass. Mild axillary adenopathy with lymph nodes on the left measuring up to 8 mm an lymph nodes on the right measuring up to 12 mm. Mild mediastinal adenopathy, with lymph nodes measuring up to a 10 mm in the right paratracheal space. Esophagus within normal limits. Lungs/Pleura: Small right pleural effusion. Small to moderate left pleural effusion. Partial consolidation in the lingula and left lower lobe. Upper Abdomen: No acute abnormality. Musculoskeletal: No acute or suspicious bone lesion. IMPRESSION: 1. Mild cardiomegaly. Moderate pericardial effusion, measuring up to 15 mm in maximum thickness on axial views. Pericardial effusion is slightly dense suggesting possible hemorrhagic or proteinaceous effusion. Query pericarditis 2. Small right pleural effusion and small moderate left pleural effusion. Partial consolidation in the lingula and left lower lobe may reflect atelectasis or pneumonia 3. Mild axillary and mediastinal adenopathy Electronically Signed   By: Donavan Foil M.D.   On: 02/06/2017 21:54     Assessment & Plan    1. Pericardial Effusion - presented with fever, a productive cough and dyspnea --> found to be febrile, tachypneic, and tachycardiac while in the ED with CXR showing PNA. Admitted for Sepsis secondary to HCAP.  - CT Chest was obtained and showed a moderate pericardial effusion measuring up to 15 mm and slightly dense, concerning for possible hemorrhagic or proteinaceous effusion and moderate left pleural effusion. ESR and CRP are pending.   - she has been tachycardiac but BP has been stable. Neck veins do not appear distended. No evidence of tamponade by physical examination.  - an echocardiogram has been  ordered to further assess her pericardial effusion. Further recommendations pending review of her images.   2. Sepsis secondary to recurrent PNA - febrile, tachycardiac, and tachypneic upon arrival to the ED with CXR showing a persistent consolidation along the left base. - has been started on Vancomycin and Cefepime for treatment of HCAP.  - per admitting team.   3. HTN - BP at 115/71 - 158/99 this admission.  - continue PTA Amlodipine 57m daily and Atenolol 1056mdaily.   Signed, BrErma HeritagePA-C 02/07/2017, 8:51 AM Pager: [phone] have seen and examined the patient along with BrErma HeritagePA-C.  I have reviewed the chart, notes and new data.  I agree with PA's note.  Key new complaints: feels much  better after 600 mL thoracentesis Key examination changes: reduced breath sounds left base, no pericardial rub, RRR, no JVD Key new findings / data: CT and CXR reviewed, CRP and ESR very high, echo still pending. 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 Pleural fluid analysis does not suggest purulent transformation. Seems to be responding to antibiotics with lower WBC and now afebrile. Autoimmune disorder or CMZ-lupus-like sd are less likely possibilities. They should be considered if the initial pattern of improvement does not prove true. Clinically, there are no signs of tamponade. Will review echo - suspect the effusion is only small-to-moderate, but will require serial echo follow up. I doubt pericardiocentesis will be required.  Sanda Klein, MD, [place] [phone] 02/07/2017, 1:31 PM

## 2017-02-07 NOTE — Progress Notes (Signed)
[facility] PROGRESS NOTE  Amanda Lozano ZOX:[id number] DOB: 02-15-1971 DOA: 02/06/2017 PCP: [name], MD  Interim summary and HPI 46 y/o female with hx of HTN and seizure disorder admitted secondary to increase SOB. Found to have sepsis due to PNA. Patient found to have pleural effusion and also pericardial effusion.   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 -low calorie diet and exercise discussed with patient  7-hypokalemia -will replete as needed   Code Status: Full. Family Communication: no family at bedside  Disposition Plan: will follow fluid analysis from thoracentesis, follow 2-D echo and cardiology rec's. Continue current antibiotics.   Consultants:  IR  Cardiology   Procedures:  Echo: pending  Thoracentesis: with 630 Ml of slightly hazy yellow fluid.  Antibiotics:  Vancomycin and cefepime 10/3  HPI/Subjective: Afebrile, no CP, no major Symptoms of SOB.  Objective: Vitals:   02/07/17 1521 02/07/17 2144  BP: 140/85 (!) 145/96  Pulse: (!) 108 (!) 105  Resp: 20 20  Temp: 99.4 F (37.4 C) 99.4 F (37.4 C)  SpO2: 100% 100%    Intake/Output Summary (Last 24 hours) at 02/07/17 2312 Last data filed at 02/07/17 1600  Gross per 24 hour  Intake          2568.75 ml  Output              550 ml  Net          2018.75 ml   Filed Weights   02/06/17 2252  Weight: 81.1 kg (178 lb 12.7 oz)     Exam:   General:  Afebrile currently, no complaining of CP, abd pain or any other acute complaints.  Cardiovascular: regular rhythm, positive tachycardia, no rubs or gallops  Respiratory: no wheezing, no crackles; positive rhonchi, normal resp effort.  Abdomen: soft, NT, ND, positive BS  Musculoskeletal: no edema, no cyanosis or clubbing   Data Reviewed: Basic Metabolic Panel:  Recent Labs Lab 02/06/17 1835 02/07/17 0441  NA 135 139  K 2.8* 4.0  CL 100* 109  CO2 22 21*  GLUCOSE 115* 115*  BUN 10 6  CREATININE 0.90 0.60  CALCIUM 8.5* 8.1*   Liver Function Tests:  Recent Labs Lab 02/06/17 1835  AST 41  ALT 24  ALKPHOS 84  BILITOT 1.4*  PROT 9.8*  ALBUMIN 3.4*   CBC:  Recent Labs Lab 02/06/17 1835 02/07/17 0441  WBC 14.7* 11.3*  NEUTROABS 10.5*  --   HGB 11.5* 10.8*  HCT 35.2* 34.0*  MCV 87.3 86.3  PLT 223 259   Cardiac Enzymes:  Recent Labs Lab 02/07/17 0824  TROPONINI <0.03   CBG: No results for input(s): GLUCAP in the last 168 hours.  Recent Results (from the past 240 hour(s))  Blood Culture (routine x 2)     Status: None (Preliminary result)   Collection Time: 02/06/17  6:35 PM  Result Value Ref Range Status   Specimen Description BLOOD LEFT ANTECUBITAL  Final   Special Requests   Final    BOTTLES DRAWN AEROBIC AND ANAEROBIC Blood Culture  adequate volume   Culture   Final    NO GROWTH < 24 HOURS Performed at [facility], [address] [place], [place] [address]    Report Status PENDING  Incomplete  Blood Culture (routine x 2)     Status: None (Preliminary result)   Collection Time: 02/06/17  6:48 PM  Result Value Ref Range Status   Specimen Description BLOOD RIGHT ANTECUBITAL  Final   Special Requests   Final    BOTTLES DRAWN AEROBIC AND ANAEROBIC Blood Culture adequate volume   Culture   Final    NO GROWTH < 24 HOURS Performed at [facility], [address] [place] [address], [place], [place] [address]    Report Status  PENDING  Incomplete  MRSA PCR Screening     Status: Abnormal   Collection Time: 02/06/17 11:15 PM  Result Value Ref Range Status   MRSA by PCR POSITIVE (A) NEGATIVE Final    Comment:        The GeneXpert MRSA Assay (FDA approved for NASAL specimens only), is one component of a comprehensive MRSA colonization surveillance program. It is not intended to diagnose MRSA infection nor to guide or monitor treatment for MRSA infections. RESULT CALLED TO, READ BACK BY AND VERIFIED WITHDaisy Lazar RN [id number] 02/07/17 A NAVARRO      Studies: Dg Chest 1 View  Result Date: 02/07/2017 CLINICAL DATA:  Status post left thoracentesis. EXAM: CHEST 1 VIEW COMPARISON:  02/06/2017 FINDINGS: Markedly improved aeration in the left lower chest compatible with recent left thoracentesis. Mild blunting at the right costophrenic angle compatible with a small amount of right pleural fluid. Again noted is enlargement of the cardiac silhouette. Residual densities in the mid and lower chest are suggestive for atelectasis. Central vascular structures are prominent and similar to the previous examination. Negative for a pneumothorax. IMPRESSION: Markedly decreased or resolved left pleural effusion following the thoracentesis. Negative for pneumothorax. Small right pleural effusion. Persistent enlargement of the cardiac silhouette. Known pericardial effusion based on recent CT. Electronically Signed   By: Richarda Overlie M.D.   On: 02/07/2017 11:05   Dg Chest 2 View  Result Date: 02/06/2017 CLINICAL DATA:  Shortness of breath EXAM: CHEST  2 VIEW COMPARISON:  12/18/2016 FINDINGS: Moderate left pleural effusion with some fluid extending along the fissure. Tiny right pleural effusion. Patchy atelectasis or infiltrate at the right base with persistent consolidation at the left lung base. Cardiomegaly with central vascular congestion. No pneumothorax. IMPRESSION: 1. Moderate left pleural effusion.  Small right pleural effusion 2.  Persisting consolidation at the left lung base may reflect atelectasis or pneumonia. Hazy right infrahilar atelectasis or infiltrate 3. Cardiomegaly with central vascular congestion Electronically Signed   By: Jasmine Pang M.D.   On: 02/06/2017 19:20   Ct Chest Wo Contrast  Result Date: 02/06/2017 CLINICAL DATA:  Worsening shortness of breath EXAM: CT CHEST WITHOUT CONTRAST TECHNIQUE: Multidetector CT imaging of the chest was performed following the standard protocol without IV contrast. COMPARISON:  Radiograph 02/06/2017 FINDINGS: Cardiovascular: Limited evaluation without intravenous contrast. Nonaneurysmal aorta. Mild cardiomegaly. Moderate pericardial effusion measuring up to 15 mm in thickness on the left side. Pericardial fluid is slightly dense. Mediastinum/Nodes: Midline trachea. No thyroid mass. Mild axillary adenopathy with lymph nodes on the left measuring up to 8 mm an lymph nodes on the right measuring up to 12 mm. Mild mediastinal adenopathy, with lymph nodes measuring up to a 10 mm in the right paratracheal space. Esophagus within normal  limits. Lungs/Pleura: Small right pleural effusion. Small to moderate left pleural effusion. Partial consolidation in the lingula and left lower lobe. Upper Abdomen: No acute abnormality. Musculoskeletal: No acute or suspicious bone lesion. IMPRESSION: 1. Mild cardiomegaly. Moderate pericardial effusion, measuring up to 15 mm in maximum thickness on axial views. Pericardial effusion is slightly dense suggesting possible hemorrhagic or proteinaceous effusion. Query pericarditis 2. Small right pleural effusion and small moderate left pleural effusion. Partial consolidation in the lingula and left lower lobe may reflect atelectasis or pneumonia 3. Mild axillary and mediastinal adenopathy Electronically Signed   By: Jasmine Pang M.D.   On: 02/06/2017 21:54   US Thoracentesis Asp Pleural Space W/img Guide  Result Date: 02/07/2017 INDICATION: Patient with history  of dyspnea, cough, pericardial effusion, left greater than right pleural effusions, recurrent pneumonia. Request made for diagnostic and therapeutic left thoracentesis. EXAM: ULTRASOUND GUIDED DIAGNOSTIC AND THERAPEUTIC LEFT THORACENTESIS MEDICATIONS: None. COMPLICATIONS: None immediate. PROCEDURE: An ultrasound guided thoracentesis was thoroughly discussed with the patient and questions answered. The benefits, risks, alternatives and complications were also discussed. The patient understands and wishes to proceed with the procedure. Written consent was obtained. Ultrasound was performed to localize and mark an adequate pocket of fluid in the left chest. The area was then prepped and draped in the normal sterile fashion. 1% Lidocaine was used for local anesthesia. Under ultrasound guidance a Safe-T-Centesis catheter was introduced. Thoracentesis was performed. The catheter was removed and a dressing applied. FINDINGS: A total of approximately 630 cc of slightly hazy, yellow fluid was removed. Samples were sent to the laboratory as requested by the clinical team. IMPRESSION: Successful ultrasound guided diagnostic and therapeutic left thoracentesis yielding 630 cc of pleural fluid. Follow-up chest x-ray revealed no pneumothorax. Read by: Jeananne Rama, PA-C Electronically Signed   By: Richarda Overlie M.D.   On: 02/07/2017 11:08    Scheduled Meds: . amLODipine  5 mg Oral Daily  . atenolol  100 mg Oral Daily  . carbamazepine  300 mg Oral BID  . Chlorhexidine Gluconate Cloth  6 each Topical Q0600  . ferrous sulfate  325 mg Oral Q breakfast  . Influenza vac split quadrivalent PF  0.5 mL Intramuscular Tomorrow-1000  . mupirocin ointment  1 application Nasal BID   Continuous Infusions: . ceFEPime (MAXIPIME) IV Stopped (02/07/17 1842)  . vancomycin 1,000 mg (02/07/17 1413)    Principal Problem:   Sepsis (HCC) Active Problems:   Convulsions/seizures (HCC)   Benign essential HTN   HCAP (healthcare-associated  pneumonia)    Time spent: 35 minutes.    Vassie Loll  [facility] Pager [phone]. If 7PM-7AM, please contact night-coverage at [email or web address], password [facility] 02/07/2017, 11:12 PM  LOS: 1 day

## 2017-02-07 NOTE — Progress Notes (Signed)
  Echocardiogram 2D Echocardiogram has been performed.  Amanda Lozano 02/07/2017, 3:33 PM

## 2017-02-07 NOTE — Procedures (Signed)
Ultrasound-guided diagnostic and therapeutic left thoracentesis performed yielding 630 cc of slightly hazy, yellow fluid. No immediate complications. Follow-up chest x-ray pending. The fluid was sent to the lab for preordered studies.

## 2017-02-07 NOTE — Care Management Note (Signed)
Case Management Note  Patient Details  Name: Amanda Lozano MRN: [id number] Date of Birth: 07-08-1970  Subjective/Objective: 46 y/o f admitted w/Sepsis. PNA,pleural effusion. From home. Cardio following.                   Action/Plan:d/c plan home.   Expected Discharge Date:                  Expected Discharge Plan:  Home/Self Care  In-House Referral:     Discharge planning Services  CM Consult  Post Acute Care Choice:    Choice offered to:     DME Arranged:    DME Agency:     HH Arranged:    HH Agency:     Status of Service:  In process, will continue to follow  If discussed at Long Length of Stay Meetings, dates discussed:    Additional Comments:  Lanier Clam, RN 02/07/2017, 12:57 PM

## 2017-02-08 ENCOUNTER — Other Ambulatory Visit: Payer: Self-pay | Admitting: Student

## 2017-02-08 DIAGNOSIS — Z9889 Other specified postprocedural states: Secondary | ICD-10-CM

## 2017-02-08 DIAGNOSIS — I3139 Other pericardial effusion (noninflammatory): Secondary | ICD-10-CM

## 2017-02-08 DIAGNOSIS — I313 Pericardial effusion (noninflammatory): Secondary | ICD-10-CM

## 2017-02-08 LAB — PH, BODY FLUID: pH, Body Fluid: 7.5

## 2017-02-08 LAB — ACID FAST SMEAR (AFB): ACID FAST SMEAR - AFSCU2: NEGATIVE

## 2017-02-08 LAB — TRIGLYCERIDES, BODY FLUIDS: Triglycerides, Fluid: 46 mg/dL

## 2017-02-08 MED ORDER — ENOXAPARIN SODIUM 40 MG/0.4ML ~~LOC~~ SOLN
40.0000 mg | SUBCUTANEOUS | Status: DC
Start: 2017-02-08 — End: 2017-02-09
  Administered 2017-02-08: 40 mg via SUBCUTANEOUS
  Filled 2017-02-08 (×2): qty 0.4

## 2017-02-08 NOTE — Progress Notes (Signed)
Progress Note  Patient Name: Amanda Lozano Date of Encounter: 02/08/2017  Primary Cardiologist: New to [facility] - Dr. Royann Shivers  Subjective   Breathing significantly improved following thoracentesis yesterday. No chest pain or palpitations.   Inpatient Medications    Scheduled Meds: . amLODipine  5 mg Oral Daily  . atenolol  100 mg Oral Daily  . carbamazepine  300 mg Oral BID  . Chlorhexidine Gluconate Cloth  6 each Topical Q0600  . ferrous sulfate  325 mg Oral Q breakfast  . Influenza vac split quadrivalent PF  0.5 mL Intramuscular Tomorrow-1000  . mupirocin ointment  1 application Nasal BID   Continuous Infusions: . ceFEPime (MAXIPIME) IV Stopped (02/08/17 0329)  . vancomycin Stopped (02/08/17 0359)   PRN Meds: acetaminophen **OR** acetaminophen, metoprolol tartrate, ondansetron **OR** ondansetron (ZOFRAN) IV   Vital Signs    Vitals:   02/07/17 1020 02/07/17 1521 02/07/17 2144 02/08/17 0524  BP: 133/87 140/85 (!) 145/96 (!) 152/97  Pulse:  (!) 108 (!) 105 (!) 109  Resp:  Temp:  99.4 F (37.4 C) 99.4 F (37.4 C) 99.2 F (37.3 C)  TempSrc:  Oral Oral Oral  SpO2:  100% 100% 100%  Weight:      Height:        Intake/Output Summary (Last 24 hours) at 02/08/17 0733 Last data filed at 02/08/17 0650  Gross per 24 hour  Intake             2520 ml  Output                0 ml  Net             2520 ml   Filed Weights   02/06/17 2252  Weight: 178 lb 12.7 oz (81.1 kg)    Telemetry    Sinus tachycardia, HR in 90's to 110's.  - Personally Reviewed  ECG    No new tracings.   Physical Exam   General: Well developed, well nourished African American female appearing in no acute distress. Head: Normocephalic, atraumatic.  Neck: Supple without bruits, JVD not elevated. Lungs:  Resp regular and unlabored, CTA with no wheezing or rales appreciated. Heart: Regular rhythm, tachycardiac rate, S1, S2, no S3, S4, or murmur; no rub. Abdomen: Soft, non-tender,  non-distended with normoactive bowel sounds. No hepatomegaly. No rebound/guarding. No obvious abdominal masses. Extremities: No clubbing, cyanosis, or edema. Distal pedal pulses are 2+ bilaterally. Neuro: Alert and oriented X 3. Moves all extremities spontaneously. Psych: Normal affect.  Labs    Chemistry Recent Labs Lab 02/06/17 1835 02/07/17 0441  NA 135 139  K 2.8* 4.0  CL 100* 109  CO2 22 21*  GLUCOSE 115* 115*  BUN 10 6  CREATININE 0.90 0.60  CALCIUM 8.5* 8.1*  PROT 9.8*  --   ALBUMIN 3.4*  --   AST 41  --   ALT 24  --   ALKPHOS 84  --   BILITOT 1.4*  --   GFRNONAA >60 >60  GFRAA >60 >60  ANIONGAP 13 9     Hematology Recent Labs Lab 02/06/17 1835 02/07/17 0441  WBC 14.7* 11.3*  RBC 4.03 3.94  HGB 11.5* 10.8*  HCT 35.2* 34.0*  MCV 87.3 86.3  MCH 28.5 27.4  MCHC 32.7 31.8  RDW 15.9* 16.0*  PLT 223 259    Cardiac Enzymes Recent Labs Lab 02/07/17 0824  TROPONINI <0.03   No results for input(s): TROPIPOC in the last 168  hours.   BNPNo results for input(s): BNP, PROBNP in the last 168 hours.   DDimer No results for input(s): DDIMER in the last 168 hours.   Radiology    Dg Chest 1 View  Result Date: 02/07/2017 CLINICAL DATA:  Status post left thoracentesis. EXAM: CHEST 1 VIEW COMPARISON:  02/06/2017 FINDINGS: Markedly improved aeration in the left lower chest compatible with recent left thoracentesis. Mild blunting at the right costophrenic angle compatible with a small amount of right pleural fluid. Again noted is enlargement of the cardiac silhouette. Residual densities in the mid and lower chest are suggestive for atelectasis. Central vascular structures are prominent and similar to the previous examination. Negative for a pneumothorax. IMPRESSION: Markedly decreased or resolved left pleural effusion following the thoracentesis. Negative for pneumothorax. Small right pleural effusion. Persistent enlargement of the cardiac silhouette. Known pericardial  effusion based on recent CT. Electronically Signed   By: Richarda Overlie M.D.   On: 02/07/2017 11:05   Dg Chest 2 View  Result Date: 02/06/2017 CLINICAL DATA:  Shortness of breath EXAM: CHEST  2 VIEW COMPARISON:  12/18/2016 FINDINGS: Moderate left pleural effusion with some fluid extending along the fissure. Tiny right pleural effusion. Patchy atelectasis or infiltrate at the right base with persistent consolidation at the left lung base. Cardiomegaly with central vascular congestion. No pneumothorax. IMPRESSION: 1. Moderate left pleural effusion.  Small right pleural effusion 2. Persisting consolidation at the left lung base may reflect atelectasis or pneumonia. Hazy right infrahilar atelectasis or infiltrate 3. Cardiomegaly with central vascular congestion Electronically Signed   By: Jasmine Pang M.D.   On: 02/06/2017 19:20   Ct Chest Wo Contrast  Result Date: 02/06/2017 CLINICAL DATA:  Worsening shortness of breath EXAM: CT CHEST WITHOUT CONTRAST TECHNIQUE: Multidetector CT imaging of the chest was performed following the standard protocol without IV contrast. COMPARISON:  Radiograph 02/06/2017 FINDINGS: Cardiovascular: Limited evaluation without intravenous contrast. Nonaneurysmal aorta. Mild cardiomegaly. Moderate pericardial effusion measuring up to 15 mm in thickness on the left side. Pericardial fluid is slightly dense. Mediastinum/Nodes: Midline trachea. No thyroid mass. Mild axillary adenopathy with lymph nodes on the left measuring up to 8 mm an lymph nodes on the right measuring up to 12 mm. Mild mediastinal adenopathy, with lymph nodes measuring up to a 10 mm in the right paratracheal space. Esophagus within normal limits. Lungs/Pleura: Small right pleural effusion. Small to moderate left pleural effusion. Partial consolidation in the lingula and left lower lobe. Upper Abdomen: No acute abnormality. Musculoskeletal: No acute or suspicious bone lesion. IMPRESSION: 1. Mild cardiomegaly. Moderate  pericardial effusion, measuring up to 15 mm in maximum thickness on axial views. Pericardial effusion is slightly dense suggesting possible hemorrhagic or proteinaceous effusion. Query pericarditis 2. Small right pleural effusion and small moderate left pleural effusion. Partial consolidation in the lingula and left lower lobe may reflect atelectasis or pneumonia 3. Mild axillary and mediastinal adenopathy Electronically Signed   By: Jasmine Pang M.D.   On: 02/06/2017 21:54   US Thoracentesis Asp Pleural Space W/img Guide  Result Date: 02/07/2017 INDICATION: Patient with history of dyspnea, cough, pericardial effusion, left greater than right pleural effusions, recurrent pneumonia. Request made for diagnostic and therapeutic left thoracentesis. EXAM: ULTRASOUND GUIDED DIAGNOSTIC AND THERAPEUTIC LEFT THORACENTESIS MEDICATIONS: None. COMPLICATIONS: None immediate. PROCEDURE: An ultrasound guided thoracentesis was thoroughly discussed with the patient and questions answered. The benefits, risks, alternatives and complications were also discussed. The patient understands and wishes to proceed with the procedure. Written consent was obtained.  Ultrasound was performed to localize and mark an adequate pocket of fluid in the left chest. The area was then prepped and draped in the normal sterile fashion. 1% Lidocaine was used for local anesthesia. Under ultrasound guidance a Safe-T-Centesis catheter was introduced. Thoracentesis was performed. The catheter was removed and a dressing applied. FINDINGS: A total of approximately 630 cc of slightly hazy, yellow fluid was removed. Samples were sent to the laboratory as requested by the clinical team. IMPRESSION: Successful ultrasound guided diagnostic and therapeutic left thoracentesis yielding 630 cc of pleural fluid. Follow-up chest x-ray revealed no pneumothorax. Read by: Jeananne Rama, PA-C Electronically Signed   By: Richarda Overlie M.D.   On: 02/07/2017 11:08    Cardiac  Studies   Echocardiogram: 02/07/2017 Study Conclusions  - Left ventricle: The cavity size was normal. Wall thickness was   increased in a pattern of mild LVH. Systolic function was normal.   The estimated ejection fraction was in the range of 50% to 55%.   Wall motion was normal; there were no regional wall motion   abnormalities. Left ventricular diastolic function parameters   were normal. - Right atrium: The atrium was mildly dilated. - Pericardium, extracardiac: A small pericardial effusion was   identified. There was no evidence of hemodynamic compromise.   Patient Profile     46 y.o. female w/ PMH of seizure disorder, HTN, and recent pleural effusion (s/p thoracentesis in 12/2016) who is currently admitted for sepsis secondary to HCAP. Cardiology consulted for evaluation of pericardial effusion.   Assessment & Plan    1. Pericardial Effusion - presented with fever, a productive cough and dyspnea --> found to be febrile, tachypneic, and tachycardiac while in the ED with CXR showing PNA. Admitted for Sepsis secondary to HCAP.  - CT Chest was obtained and showed a moderate pericardial effusion measuring up to 15 mm and slightly dense, concerning for possible hemorrhagic or proteinaceous effusion and moderate left pleural effusion. - CRP elevated to 31.4, Sed rate 107. Echo performed and shows a small pericardial effusion with no evidence of tamponade. No indication for a pericardiocentesis at this time. Will need a repeat serial echocardiogram as an outpatient.   2. Sepsis secondary to recurrent PNA/ Left Pleural Effusion - febrile, tachycardiac, and tachypneic upon arrival to the ED with CXR showing a persistent consolidation along the left base. - has been started on Vancomycin and Cefepime for treatment of HCAP. Underwent thoracentesis on 10/4 with 630 cc of exudative fluid removed.  - per admitting team.   3. HTN - BP at 133/85 - 152/97 within the past 24 hours.  -  continue PTA Amlodipine  daily and Atenolol  daily. Consider further titration of Amlodipine if BP remains elevated.    Signed, Ellsworth Lennox , PA-C 7:33 AM 02/08/2017 Pager: [phone]  I have seen and examined the patient along with Ellsworth Lennox , PA-C.  I have reviewed the chart, notes and new data.  I agree with PA/NP's note.  Key new complaints: overall better since yesterday Key examination changes: no pericardial rub, no clinical tamponade Key new findings / data: her pericardial effusion is mall. The pericardium is thickened. She does have some evidence of respiratory displacement of the interventricular septum - most likely acute constrictive physiology related to pericardial inflammation, but not hemodynamic consequences.  PLAN: No other recommendations at this time.  Would advise follow up echo in 2-3 weeks and outpatient follow up. Please call back for other questions over the  weekend.  Thurmon Fair, MD, [facility] [phone] 02/08/2017, 11:28 AM

## 2017-02-08 NOTE — Progress Notes (Signed)
Pharmacy Antibiotic Note  Amanda Lozano is a 46 y.o. female admitted on 02/06/2017 with HCAP.  Pharmacy has been consulted for vancomycin dosing. Cefepime per MD.  Today, 02/08/2017  Day #2 antibiotics  WBC improving  Pleural fluid consistent with exudate per protein ratio  Fevers resolved   Plan:  Cefepime 1 Gm IV q8h  Vancomycin 1 Gm IV q12h VT=15-20 mg/L  Check SCr in am  F/u scr/cultures/levels  De-escalate antibiotics per cultures  Height:  (157.5 cm) Weight: 178 lb 12.7 oz (81.1 kg) IBW/kg (Calculated) : 50.1  Temp (24hrs), Avg:99.3 F (37.4 C), Min:99.2 F (37.3 C), Max:99.4 F (37.4 C)   Recent Labs Lab 02/06/17 1834 02/06/17 1835 02/07/17 0441  WBC  --  14.7* 11.3*  CREATININE  --  0.90 0.60  LATICACIDVEN 1.21  --   --     Estimated Creatinine Clearance: 88.5 mL/min (by C-G formula based on SCr of 0.6 mg/dL).    No Known Allergies  Antimicrobials this admission: 10/3 zosyn >> x1 ED 10/3 cefepime >>  10/3 vancomycin >>  Dose adjustments this admission:  Microbiology results: 10/3 BCx: NGTD 10/3 MRSA PCR: positive 10/3 flu: neg 10/3 legionella 10/3 strep: neg 10/4 pleural fluid fungus: 10/4 pleural fluid acid fast:  Thank you for allowing pharmacy to be a part of this patient's care.  Juliette Alcide, PharmD, BCPS.   Pager: [phone] 02/08/2017 8:25 AM

## 2017-02-08 NOTE — Progress Notes (Signed)
[facility] PROGRESS NOTE  Amanda Lozano WUJ:[id number] DOB: 1970-10-01 DOA: 02/06/2017 PCP: [name], MD  Interim summary and HPI 46 y/o female with hx of HTN and seizure disorder admitted secondary to increase SOB. Found to have sepsis due to PNA. Patient found to have pleural effusion and also pericardial effusion.   Assessment/Plan: 1-Sepsis from recurrent PNA -will continue current IV antibiotics -follow culture and pleural fluid analysis results (suggesting exudates) -no requiring oxygen supplementation, afebrile and WBC's continue trending down. 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 -low calorie diet and exercise discussed with patient  7-hypokalemia -stable and WNL now -will monitor and continue repletion as needed    Code Status: Full. Family Communication: no family at bedside  Disposition Plan: fluid analysis suggesting exudate, will follow cx's. Small pleural effusion in 2-D echo.continue current abx's.  Consultants:  IR  Cardiology   Procedures:  Echo: with preserved EF, no wall motion abnormalities; small pleural effusion.  Thoracentesis: with 630 Ml of slightly hazy yellow fluid.  Antibiotics:  Vancomycin and cefepime 10/3  HPI/Subjective: No fever, no CP, feeling much better and breathing easier after thoracentesis   Objective: Vitals:   02/08/17 1508 02/08/17 2045  BP: (!) 160/85 (!) 151/89  Pulse: (!) 106 (!) 102  Resp: 20 20  Temp: 98.9 F (37.2 C) 99.8 F (37.7 C)  SpO2: 100% 100%     Intake/Output Summary (Last 24 hours) at 02/08/17 2254 Last data filed at 02/08/17 1600  Gross per 24 hour  Intake             1270 ml  Output                0 ml  Net             1270 ml   Filed Weights   02/06/17 2252  Weight: 81.1 kg (178 lb 12.7 oz)    Exam:   General:  No fever, breathing better and feeling better. denies CP and palpitations.   Cardiovascular: mild tachycardia, no rubs, no gallops  Respiratory: positive rhonchi, no wheezing, no crackles. Good O2 sat on RA  Abdomen: soft, NT, ND, positive BS  Musculoskeletal: no edema, no cyanosis, no clubbing.   Data Reviewed: Basic Metabolic Panel:  Recent Labs Lab 02/06/17 1835 02/07/17 0441  NA 135 139  K 2.8* 4.0  CL 100* 109  CO2 22 21*  GLUCOSE 115* 115*  BUN 10 6  CREATININE 0.90 0.60  CALCIUM 8.5* 8.1*   Liver Function Tests:  Recent Labs Lab 02/06/17 1835  AST 41  ALT 24  ALKPHOS 84  BILITOT 1.4*  PROT 9.8*  ALBUMIN 3.4*   CBC:  Recent Labs Lab 02/06/17 1835 02/07/17 0441  WBC 14.7* 11.3*  NEUTROABS 10.5*  --   HGB 11.5* 10.8*  HCT 35.2* 34.0*  MCV 87.3 86.3  PLT 223 259   Cardiac Enzymes:  Recent Labs Lab 02/07/17 0824  TROPONINI <0.03   CBG: No results for input(s): GLUCAP in the last 168 hours.  Recent Results (from the past 240 hour(s))  Blood Culture (routine x 2)  Status: None (Preliminary result)   Collection Time: 02/06/17  6:35 PM  Result Value Ref Range Status   Specimen Description BLOOD LEFT ANTECUBITAL  Final   Special Requests   Final    BOTTLES DRAWN AEROBIC AND ANAEROBIC Blood Culture adequate volume   Culture   Final    NO GROWTH 2 DAYS Performed at [facility], [address] [place], [place] [address]    Report Status PENDING  Incomplete  Blood Culture (routine x 2)     Status: None (Preliminary result)   Collection Time: 02/06/17  6:48 PM  Result Value Ref Range Status   Specimen Description BLOOD RIGHT ANTECUBITAL  Final    Special Requests   Final    BOTTLES DRAWN AEROBIC AND ANAEROBIC Blood Culture adequate volume   Culture   Final    NO GROWTH 2 DAYS Performed at [facility], [address], [place], [place] [address]    Report Status PENDING  Incomplete  MRSA PCR Screening     Status: Abnormal   Collection Time: 02/06/17 11:15 PM  Result Value Ref Range Status   MRSA by PCR POSITIVE (A) NEGATIVE Final    Comment:        The GeneXpert MRSA Assay (FDA approved for NASAL specimens only), is one component of a comprehensive MRSA colonization surveillance program. It is not intended to diagnose MRSA infection nor to guide or monitor treatment for MRSA infections. RESULT CALLED TO, READ BACK BY AND VERIFIED WITHDaisy Lazar RN [id number] 02/07/17 A NAVARRO   Acid Fast Smear (AFB)     Status: None   Collection Time: 02/07/17 10:15 AM  Result Value Ref Range Status   AFB Specimen Processing Not Indicated  Final   Acid Fast Smear Negative  Final    Comment: (NOTE) Performed At: [facility] [address] [place], [place] [id number] Mila Homer MD UV:[id number]    Source (AFB) PLEURAL  Final    Comment: LEFT     Studies: Dg Chest 1 View  Result Date: 02/07/2017 CLINICAL DATA:  Status post left thoracentesis. EXAM: CHEST 1 VIEW COMPARISON:  02/06/2017 FINDINGS: Markedly improved aeration in the left lower chest compatible with recent left thoracentesis. Mild blunting at the right costophrenic angle compatible with a small amount of right pleural fluid. Again noted is enlargement of the cardiac silhouette. Residual densities in the mid and lower chest are suggestive for atelectasis. Central vascular structures are prominent and similar to the previous examination. Negative for a pneumothorax. IMPRESSION: Markedly decreased or resolved left pleural effusion following the thoracentesis. Negative for pneumothorax. Small right pleural effusion. Persistent enlargement of the cardiac silhouette.  Known pericardial effusion based on recent CT. Electronically Signed   By: Richarda Overlie M.D.   On: 02/07/2017 11:05   US Thoracentesis Asp Pleural Space W/img Guide  Result Date: 02/07/2017 INDICATION: Patient with history of dyspnea, cough, pericardial effusion, left greater than right pleural effusions, recurrent pneumonia. Request made for diagnostic and therapeutic left thoracentesis. EXAM: ULTRASOUND GUIDED DIAGNOSTIC AND THERAPEUTIC LEFT THORACENTESIS MEDICATIONS: None. COMPLICATIONS: None immediate. PROCEDURE: An ultrasound guided thoracentesis was thoroughly discussed with the patient and questions answered. The benefits, risks, alternatives and complications were also discussed. The patient understands and wishes to proceed with the procedure. Written consent was obtained. Ultrasound was performed to localize and mark an adequate pocket of fluid in the left chest. The area was then prepped and draped in the normal sterile fashion. 1% Lidocaine was  used for local anesthesia. Under ultrasound guidance a Safe-T-Centesis catheter was introduced. Thoracentesis was performed. The catheter was removed and a dressing applied. FINDINGS: A total of approximately 630 cc of slightly hazy, yellow fluid was removed. Samples were sent to the laboratory as requested by the clinical team. IMPRESSION: Successful ultrasound guided diagnostic and therapeutic left thoracentesis yielding 630 cc of pleural fluid. Follow-up chest x-ray revealed no pneumothorax. Read by: Jeananne Rama, PA-C Electronically Signed   By: Richarda Overlie M.D.   On: 02/07/2017 11:08    Scheduled Meds: . amLODipine  5 mg Oral Daily  . atenolol  100 mg Oral Daily  . carbamazepine  300 mg Oral BID  . Chlorhexidine Gluconate Cloth  6 each Topical Q0600  . enoxaparin (LOVENOX) injection  40 mg Subcutaneous Q24H  . ferrous sulfate  325 mg Oral Q breakfast  . Influenza vac split quadrivalent PF  0.5 mL Intramuscular Tomorrow-1000  . mupirocin ointment   1 application Nasal BID   Continuous Infusions: . ceFEPime (MAXIPIME) IV Stopped (02/08/17 1913)  . vancomycin Stopped (02/08/17 1556)    Principal Problem:   Sepsis (HCC) Active Problems:   Convulsions/seizures (HCC)   Benign essential HTN   HCAP (healthcare-associated pneumonia)    Time spent: 30 minutes.    Vassie Loll  [facility] Pager [phone]. If 7PM-7AM, please contact night-coverage at [email or web address], password [facility] 02/08/2017, 10:54 PM  LOS: 2 days

## 2017-02-09 DIAGNOSIS — Z9889 Other specified postprocedural states: Secondary | ICD-10-CM

## 2017-02-09 LAB — CREATININE, SERUM: CREATININE: 0.59 mg/dL (ref 0.44–1.00)

## 2017-02-09 MED ORDER — ATENOLOL 50 MG PO TABS: 150.0000 mg | ORAL_TABLET | Freq: Every day | ORAL | Status: DC

## 2017-02-09 MED ORDER — AMOXICILLIN-POT CLAVULANATE 875-125 MG PO TABS: 1.0000 | ORAL_TABLET | Freq: Two times a day (BID) | ORAL | 0 refills | Status: DC

## 2017-02-09 MED ORDER — ATENOLOL 50 MG PO TABS: 150.0000 mg | ORAL_TABLET | Freq: Every day | ORAL | 1 refills | Status: AC

## 2017-02-09 MED ORDER — AMOXICILLIN-POT CLAVULANATE 875-125 MG PO TABS
1.0000 | ORAL_TABLET | Freq: Two times a day (BID) | ORAL | Status: DC
  Administered 2017-02-09: 1 via ORAL
  Filled 2017-02-09: qty 1

## 2017-02-09 NOTE — Discharge Summary (Signed)
Physician Discharge Summary  Amanda Lozano ZOX:[id number] DOB: 10-29-1970 DOA: 02/06/2017  PCP: [name], MD  Admit date: 02/06/2017 Discharge date: 02/09/2017  Time spent: 35 minutes  Recommendations for Outpatient Follow-up:  1. Repeat BMET to evaluate electrolytes and renal function  2. Repeat CXR in 2-4 weeks to follow complete resolution of infiltrates and not further re-accumulation of pleural effusion. 3. Reassess BP and HR; adjust medications as needed    Discharge Diagnoses:  Principal Problem:   Sepsis (HCC) Active Problems:   Convulsions/seizures (HCC)   Benign essential HTN   HCAP (healthcare-associated pneumonia)   S/P thoracentesis   Discharge Condition: stable and improved. Discharge home with instructions to follow up with PCP in 10 days.  Diet recommendation: heart healthy and low calorie diet   Filed Weights   02/06/17 2252  Weight: 81.1 kg (178 lb 12.7 oz)    History of present illness:  46 y/o female with hx of HTN and seizure disorder admitted secondary to increase SOB. Found to have sepsis due to PNA. Patient found to have pleural effusion and also pericardial effusion.   Hospital Course:  1-Sepsis from recurrent PNA -patient received 4 days of IV antibiotics and then discharge on Augmentin for 8 more days. -thoracentesis demonstrated exudate, but no microorganism isolated. (most likely parapneumonic effusion) -no requiring oxygen supplementation, afebrile and WBC's continue trending down (essentially back to normal at discharge). -will recommend repeat CXR in 2-4 weeks.  2-HTN/SVT -will continue amlodipine and atenolol -atenolol dose adjusted to  daily for better HR control. -BP stable overall  3-pericardial effusion  -echo with just small amount of fluid and no signs of tamponade  -cardiology recommended repeat echo in 3 weeks; they will set up follow up appointment.  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 -low calorie diet and exercise discussed with patient  7-hypokalemia -repleted and WNL at discharge -will recommend BMET at follow up.  Procedures:  Echo: with preserved EF, no wall motion abnormalities; small pleural effusion.  Thoracentesis: with 630 Ml of slightly hazy yellow fluid.  Consultations:  Cardiology   Discharge Exam: Vitals:   02/08/17 2045 02/09/17 0458  BP: (!) 151/89 (!) 148/94  Pulse: (!) 102 93  Resp: 20 20  Temp: 99.8 F (37.7 C) 98.2 F (36.8 C)  SpO2: 100% 100%    General:  No fever, breathing a lot better and feeling ready to go home. denies CP and palpitations. No nausea, no vomiting.   Cardiovascular: mild tachycardia, no rubs, no gallops  Respiratory: positive rhonchi, no wheezing, no crackles. Good O2 sat on RA  Abdomen: soft, NT, ND, positive BS  Musculoskeletal: no edema, no cyanosis, no clubbing.    Discharge Instructions   Discharge Instructions    Diet - low sodium heart healthy    Complete by:  As directed    Discharge instructions    Complete by:  As directed    Keep yourself well hydrated  Take medications as prescribed  Arrange follow up with PCP in 2 weeks Cardiology service will set up appointment in 2-3 weeks to repeat 2-D echo and determine needs of further intervention.     Current Discharge Medication List    START taking these medications   Details  amoxicillin-clavulanate (AUGMENTIN) 875-125 MG tablet Take 1 tablet by mouth every 12 (twelve)  hours. Qty: 16 tablet, Refills: 0      CONTINUE these medications which have CHANGED   Details  atenolol (TENORMIN) 50 MG tablet Take 3 tablets (150 mg total) by mouth daily. Qty: 90 tablet, Refills: 1      CONTINUE these medications which have NOT  CHANGED   Details  amLODipine (NORVASC) 5 MG tablet Take 1 tablet (5 mg total) by mouth daily. Qty: 30 tablet, Refills: 1   Associated Diagnoses: Vaginal bleeding affecting early pregnancy; Complete miscarriage; Maternal chronic hypertension in first trimester; Uncontrolled hypertension    aspirin EC 81 MG tablet Take 81 mg by mouth every 4 (four) hours as needed (HA).    carbamazepine (TEGRETOL) 100 MG chewable tablet CHEW AND SWALLOW 3 TABLETS BY MOUTH TWICE DAILY Qty: 540 tablet, Refills: 3    ferrous sulfate 325 (65 FE) MG tablet Take 325 mg by mouth daily with breakfast.       No Known Allergies Follow-up Information    [facility] Follow up.   Specialty:  Cardiology Why:  Appointment for repeat limited echocardiogram on 03/01/2017 at 10:30AM.  Contact information: [address], [address] [place] [address] [phone]       Abelino Derrick, PA-C Follow up on 03/06/2017.   Specialties:  Cardiology, Radiology Why:  Cardiology Follow-Up after echocardiogram on 03/06/2017 at 9:30 AM. Note that this office address is different from where your echocardiogram was performed.  Contact information: [address] [place] [address] [phone]        [name], MD. Schedule an appointment as soon as possible for a visit in 10 day(s).   Specialty:  Internal Medicine Contact information: [address] [place] [address] [phone]           The results of significant diagnostics from this hospitalization (including imaging, microbiology, ancillary and laboratory) are listed below for reference.    Significant Diagnostic Studies: Dg Chest 1 View  Result Date: 02/07/2017 CLINICAL DATA:  Status post left thoracentesis. EXAM: CHEST 1 VIEW COMPARISON:  02/06/2017 FINDINGS: Markedly improved aeration in the left lower chest compatible with recent left thoracentesis. Mild blunting at the  right costophrenic angle compatible with a small amount of right pleural fluid. Again noted is enlargement of the cardiac silhouette. Residual densities in the mid and lower chest are suggestive for atelectasis. Central vascular structures are prominent and similar to the previous examination. Negative for a pneumothorax. IMPRESSION: Markedly decreased or resolved left pleural effusion following the thoracentesis. Negative for pneumothorax. Small right pleural effusion. Persistent enlargement of the cardiac silhouette. Known pericardial effusion based on recent CT. Electronically Signed   By: Richarda Overlie M.D.   On: 02/07/2017 11:05   Dg Chest 2 View  Result Date: 02/06/2017 CLINICAL DATA:  Shortness of breath EXAM: CHEST  2 VIEW COMPARISON:  12/18/2016 FINDINGS: Moderate left pleural effusion with some fluid extending along the fissure. Tiny right pleural effusion. Patchy atelectasis or infiltrate at the right base with persistent consolidation at the left lung base. Cardiomegaly with central vascular congestion. No pneumothorax. IMPRESSION: 1. Moderate left pleural effusion.  Small right pleural effusion 2. Persisting consolidation at the left lung base may reflect atelectasis or pneumonia. Hazy right infrahilar atelectasis or infiltrate 3. Cardiomegaly with central vascular congestion Electronically Signed   By: Jasmine Pang M.D.   On: 02/06/2017 19:20   Ct Chest Wo Contrast  Result Date: 02/06/2017 CLINICAL DATA:  Worsening shortness  of breath EXAM: CT CHEST WITHOUT CONTRAST TECHNIQUE: Multidetector CT imaging of the chest was performed following the standard protocol without IV contrast. COMPARISON:  Radiograph 02/06/2017 FINDINGS: Cardiovascular: Limited evaluation without intravenous contrast. Nonaneurysmal aorta. Mild cardiomegaly. Moderate pericardial effusion measuring up to 15 mm in thickness on the left side. Pericardial fluid is slightly dense. Mediastinum/Nodes: Midline trachea. No thyroid mass.  Mild axillary adenopathy with lymph nodes on the left measuring up to 8 mm an lymph nodes on the right measuring up to 12 mm. Mild mediastinal adenopathy, with lymph nodes measuring up to a 10 mm in the right paratracheal space. Esophagus within normal limits. Lungs/Pleura: Small right pleural effusion. Small to moderate left pleural effusion. Partial consolidation in the lingula and left lower lobe. Upper Abdomen: No acute abnormality. Musculoskeletal: No acute or suspicious bone lesion. IMPRESSION: 1. Mild cardiomegaly. Moderate pericardial effusion, measuring up to 15 mm in maximum thickness on axial views. Pericardial effusion is slightly dense suggesting possible hemorrhagic or proteinaceous effusion. Query pericarditis 2. Small right pleural effusion and small moderate left pleural effusion. Partial consolidation in the lingula and left lower lobe may reflect atelectasis or pneumonia 3. Mild axillary and mediastinal adenopathy Electronically Signed   By: Jasmine Pang M.D.   On: 02/06/2017 21:54   US Thoracentesis Asp Pleural Space W/img Guide  Result Date: 02/07/2017 INDICATION: Patient with history of dyspnea, cough, pericardial effusion, left greater than right pleural effusions, recurrent pneumonia. Request made for diagnostic and therapeutic left thoracentesis. EXAM: ULTRASOUND GUIDED DIAGNOSTIC AND THERAPEUTIC LEFT THORACENTESIS MEDICATIONS: None. COMPLICATIONS: None immediate. PROCEDURE: An ultrasound guided thoracentesis was thoroughly discussed with the patient and questions answered. The benefits, risks, alternatives and complications were also discussed. The patient understands and wishes to proceed with the procedure. Written consent was obtained. Ultrasound was performed to localize and mark an adequate pocket of fluid in the left chest. The area was then prepped and draped in the normal sterile fashion. 1% Lidocaine was used for local anesthesia. Under ultrasound guidance a Safe-T-Centesis  catheter was introduced. Thoracentesis was performed. The catheter was removed and a dressing applied. FINDINGS: A total of approximately 630 cc of slightly hazy, yellow fluid was removed. Samples were sent to the laboratory as requested by the clinical team. IMPRESSION: Successful ultrasound guided diagnostic and therapeutic left thoracentesis yielding 630 cc of pleural fluid. Follow-up chest x-ray revealed no pneumothorax. Read by: Jeananne Rama, PA-C Electronically Signed   By: Richarda Overlie M.D.   On: 02/07/2017 11:08    Microbiology: Recent Results (from the past 240 hour(s))  Blood Culture (routine x 2)     Status: None (Preliminary result)   Collection Time: 02/06/17  6:35 PM  Result Value Ref Range Status   Specimen Description BLOOD LEFT ANTECUBITAL  Final   Special Requests   Final    BOTTLES DRAWN AEROBIC AND ANAEROBIC Blood Culture adequate volume   Culture   Final    NO GROWTH 2 DAYS Performed at [facility], [address], [place], [place] [address]    Report Status PENDING  Incomplete  Blood Culture (routine x 2)     Status: None (Preliminary result)   Collection Time: 02/06/17  6:48 PM  Result Value Ref Range Status   Specimen Description BLOOD RIGHT ANTECUBITAL  Final   Special Requests   Final    BOTTLES DRAWN AEROBIC AND ANAEROBIC Blood Culture adequate volume   Culture   Final    NO GROWTH 2 DAYS Performed at [facility], [address] [place], [place] [address]    Report Status PENDING  Incomplete  MRSA PCR Screening     Status: Abnormal   Collection Time: 02/06/17 11:15 PM  Result Value Ref Range Status   MRSA by PCR POSITIVE (A) NEGATIVE Final    Comment:        The GeneXpert MRSA Assay (FDA approved for NASAL specimens only), is one component of a comprehensive MRSA colonization surveillance program. It is not intended to diagnose MRSA infection nor to guide or monitor treatment for MRSA infections. RESULT CALLED TO, READ BACK BY AND  VERIFIED WITHDaisy Lazar RN [id number] 02/07/17 A NAVARRO   Acid Fast Smear (AFB)     Status: None   Collection Time: 02/07/17 10:15 AM  Result Value Ref Range Status   AFB Specimen Processing Not Indicated  Final   Acid Fast Smear Negative  Final    Comment: (NOTE) Performed At: [facility] [address] [place], [place] [id number] Mila Homer MD QI:[id number]    Source (AFB) PLEURAL  Final    Comment: LEFT     Labs: Basic Metabolic Panel:  Recent Labs Lab 02/06/17 1835 02/07/17 0441 02/09/17 0504  NA 135 139  --   K 2.8* 4.0  --   CL 100* 109  --   CO2 22 21*  --   GLUCOSE 115* 115*  --   BUN 10 6  --   CREATININE 0.90 0.60 0.59  CALCIUM 8.5* 8.1*  --    Liver Function Tests:  Recent Labs Lab 02/06/17 1835  AST 41  ALT 24  ALKPHOS 84  BILITOT 1.4*  PROT 9.8*  ALBUMIN 3.4*   CBC:  Recent Labs Lab 02/06/17 1835 02/07/17 0441  WBC 14.7* 11.3*  NEUTROABS 10.5*  --   HGB 11.5* 10.8*  HCT 35.2* 34.0*  MCV 87.3 86.3  PLT 223 259   Cardiac Enzymes:  Recent Labs Lab 02/07/17 0824  TROPONINI <0.03    Signed:  Vassie Loll MD.  [facility] 02/09/2017, 12:30 PM

## 2017-02-11 LAB — CULTURE, BLOOD (ROUTINE X 2)
Culture: NO GROWTH
Culture: NO GROWTH
SPECIAL REQUESTS: ADEQUATE
SPECIAL REQUESTS: ADEQUATE

## 2017-02-12 LAB — CHOLESTEROL, BODY FLUID: CHOL FL: 133 mg/dL

## 2017-02-12 LAB — LEGIONELLA PNEUMOPHILA SEROGP 1 UR AG: L. PNEUMOPHILA SEROGP 1 UR AG: NEGATIVE

## 2017-03-01 ENCOUNTER — Ambulatory Visit (HOSPITAL_COMMUNITY): Payer: Medicaid Other | Attending: Cardiovascular Disease

## 2017-03-01 ENCOUNTER — Other Ambulatory Visit: Payer: Self-pay

## 2017-03-01 DIAGNOSIS — R000 Tachycardia, unspecified: Secondary | ICD-10-CM | POA: Diagnosis not present

## 2017-03-01 DIAGNOSIS — I3139 Other pericardial effusion (noninflammatory): Secondary | ICD-10-CM

## 2017-03-01 DIAGNOSIS — I313 Pericardial effusion (noninflammatory): Secondary | ICD-10-CM | POA: Diagnosis not present

## 2017-03-01 DIAGNOSIS — I10 Essential (primary) hypertension: Secondary | ICD-10-CM | POA: Diagnosis not present

## 2017-03-06 ENCOUNTER — Ambulatory Visit: Payer: Medicaid Other | Admitting: Cardiology

## 2017-03-10 LAB — FUNGUS CULTURE WITH STAIN

## 2017-03-10 LAB — FUNGUS CULTURE RESULT

## 2017-03-10 LAB — FUNGAL ORGANISM REFLEX

## 2017-03-24 LAB — ACID FAST CULTURE WITH REFLEXED SENSITIVITIES

## 2017-03-24 LAB — ACID FAST CULTURE WITH REFLEXED SENSITIVITIES (MYCOBACTERIA): Acid Fast Culture: NEGATIVE

## 2017-04-01 ENCOUNTER — Ambulatory Visit: Payer: Medicaid Other | Admitting: Neurology

## 2017-04-01 ENCOUNTER — Encounter: Payer: Self-pay | Admitting: Neurology

## 2017-04-01 VITALS — BP 150/100 | HR 83 | Ht 61.0 in | Wt 177.4 lb

## 2017-04-01 DIAGNOSIS — G40219 Localization-related (focal) (partial) symptomatic epilepsy and epileptic syndromes with complex partial seizures, intractable, without status epilepticus: Secondary | ICD-10-CM

## 2017-04-01 DIAGNOSIS — I10 Essential (primary) hypertension: Secondary | ICD-10-CM | POA: Diagnosis not present

## 2017-04-01 MED ORDER — CARBAMAZEPINE 100 MG PO CHEW: CHEWABLE_TABLET | ORAL | 3 refills | Status: DC

## 2017-04-01 NOTE — Progress Notes (Signed)
NEUROLOGY FOLLOW UP OFFICE NOTE  Amanda RibasStephanie Lozano [id number]  HISTORY OF PRESENT ILLNESS: Amanda RibasStephanie Prinsen is a 46 year old woman with history of hypertension and depression who presents for follow up regarding seizure disorder.     UPDATE: She take carbamazepine 300mg  twice daily.  Level from 12/17/16 was 8.8. MRI of brain without contrast from 12/17/16 was personally reviewed and was normal.  No recent seizures.   Since last visit, she has been hospitalized twice for pneumonia and sepsis.  She is doing better.   HISTORY: Patient has a history of seizure disorder, diagnosed in [id number].  However, she has experienced multiple brief "staring spells" since childhood, looking like she was daydreaming. It would only last a few seconds. In 1997, 3 months after having her second child, she had her first generalized tonic-clonic seizure. At first, it was thought to be a reaction to the Depo shot.  She denies any aura but will sometimes no fatigue. Usually they occurred at night time out of sleep. Often, she would wake up feeling fatigued. She has had some witnessed seizures and was noted to have generalized shaking with eyes rolled back. There would be a tongue laceration but no incontinence.  She was initially followed for several years by a neurologist, Dr. Micheline [place]azeBoyle, in [place].  Reportedly, MRIs of the brain were unremarkable. She also said she had several EEGs which were unremarkable.     She had been on carbamazepine 100mg  in morning and 200mg  in evening for many years.  High doses caused increased lethargy.  She was seizure free from generalized tonic-clonic seizures for about 7 years up until May 2014, presumably due to having missed some doses of her medication.  She will have episodes about every 6 months or so.  She has disturbed sleep with vivid dreams.  She wakes up with the same symptoms of headache, depression and sleepiness, suggesting another seizure.  This would last a couple of days.   Sometimes, she has bitten her tongue or lost bladder control.  It should be noted that she still occasionally has "silent" seizures, namely the staring spells. She does not drive.  24 hour ambulatory EEG was performed 12/03/13 to 12/04/13, which captured several staring spells and was a normal awake and asleep study.  Stress and heavy menses are triggers.   She does have a family history of seizures on her father's side, namely her grandmother and her cousin. She had a normal birth. She has no history of meningitis or significant head trauma.    Past AEDs:  Dilantin (elevated LFTs), Topamax (fogginess), Lamictal (could not function)  PAST MEDICAL HISTORY: Past Medical History:  Diagnosis Date  . High cholesterol   . History of carpal tunnel syndrome   . Hypertension   . Seizures (HCC)    Last seizure "years ago"    MEDICATIONS: Current Outpatient Medications on File Prior to Visit  Medication Sig Dispense Refill  . amLODipine (NORVASC) 5 MG tablet Take 1 tablet (5 mg total) by mouth daily. 30 tablet 1  . amoxicillin-clavulanate (AUGMENTIN) 875-125 MG tablet Take 1 tablet by mouth every 12 (twelve) hours. (Patient not taking: Reported on 04/01/2017) 16 tablet 0  . aspirin EC 81 MG tablet Take 81 mg by mouth every 4 (four) hours as needed (HA).    Marland Kitchen. atenolol (TENORMIN) 50 MG tablet Take 3 tablets (150 mg total) by mouth daily. 90 tablet 1  . ferrous sulfate 325 (65 FE) MG tablet Take 325 mg by  mouth daily with breakfast.     No current facility-administered medications on file prior to visit.     ALLERGIES: No Known Allergies  FAMILY HISTORY: Family History  Problem Relation Age of Onset  . Hypertension Father   . Seizures Paternal Grandmother     SOCIAL HISTORY: Social History   Socioeconomic History  . Marital status: Divorced    Spouse name: Not on file  . Number of children: Not on file  . Years of education: Not on file  . Highest education level: Not on file  Social  Needs  . Financial resource strain: Not on file  . Food insecurity - worry: Not on file  . Food insecurity - inability: Not on file  . Transportation needs - medical: Not on file  . Transportation needs - non-medical: Not on file  Occupational History  . Not on file  Tobacco Use  . Smoking status: Never Smoker  . Smokeless tobacco: Never Used  Substance and Sexual Activity  . Alcohol use: No  . Drug use: No  . Sexual activity: Not Currently    Partners: Male    Birth control/protection: Abstinence  Other Topics Concern  . Not on file  Social History Narrative  . Not on file    REVIEW OF SYSTEMS: Constitutional: No fevers, chills, or sweats, no generalized fatigue, change in appetite Eyes: No visual changes, double vision, eye pain Ear, nose and throat: No hearing loss, ear pain, nasal congestion, sore throat Cardiovascular: No chest pain, palpitations Respiratory:  No shortness of breath at rest or with exertion, wheezes GastrointestinaI: No nausea, vomiting, diarrhea, abdominal pain, fecal incontinence Genitourinary:  No dysuria, urinary retention or frequency Musculoskeletal:  No neck pain, back pain Integumentary: No rash, pruritus, skin lesions Neurological: as above Psychiatric: No depression, insomnia, anxiety Endocrine: No palpitations, fatigue, diaphoresis, mood swings, change in appetite, change in weight, increased thirst Hematologic/Lymphatic:  No purpura, petechiae. Allergic/Immunologic: no itchy/runny eyes, nasal congestion, recent allergic reactions, rashes  PHYSICAL EXAM: Vitals:   04/01/17 0935  BP: (!) 150/100  Pulse: 83  SpO2: 95%   General: No acute distress.  Patient appears well-groomed.   Head:  Normocephalic/atraumatic Eyes:  Fundi examined but not visualized Neck: supple, no paraspinal tenderness, full range of motion Heart:  Regular rate and rhythm Lungs:  Clear to auscultation bilaterally Back: No paraspinal tenderness Neurological Exam:  alert and oriented to person, place, and time. Attention span and concentration intact, recent and remote memory intact, fund of knowledge intact.  Speech fluent and not dysarthric, language intact.  CN II-XII intact. Bulk and tone normal, muscle strength 5/5 throughout.  Sensation to light touch  intact.  Deep tendon reflexes 2+ throughout.  Finger to nose testing intact.  Gait normal  IMPRESSION: Seizure disorder Hypertension  PLAN: 1.  Refill and continue carbamazepine 300mg  twice daily 2.  Follow up with PCP regarding elevated blood pressure 3.  Follow up in 9 months.  19 minutes spent face to face with patient, over 50% spent discussing management.  Shon MilletAdam Tamari Redwine, DO  CC:  Jackie PlumGeorge Osei-Bonsu, MD

## 2017-04-01 NOTE — Patient Instructions (Signed)
1.  Continue carbamazepine 300mg  twice daily 2.  Follow up with your PCP regarding blood pressure 3.  Follow up with me in 9 months.

## 2017-05-16 ENCOUNTER — Ambulatory Visit: Payer: Medicaid Other | Admitting: Neurology

## 2017-05-31 ENCOUNTER — Other Ambulatory Visit: Payer: Self-pay

## 2017-05-31 ENCOUNTER — Telehealth: Payer: Self-pay | Admitting: Neurology

## 2017-05-31 MED ORDER — CARBAMAZEPINE 100 MG PO CHEW: CHEWABLE_TABLET | ORAL | 3 refills | Status: DC

## 2017-05-31 NOTE — Telephone Encounter (Signed)
Rx sent to [facility]

## 2017-05-31 NOTE — Telephone Encounter (Signed)
Patient called and would like a refill on her Seizure medication[facility] Thanks

## 2017-07-17 ENCOUNTER — Ambulatory Visit: Payer: Medicaid Other | Admitting: Neurology

## 2017-07-17 ENCOUNTER — Encounter: Payer: Self-pay | Admitting: Neurology

## 2017-07-17 VITALS — BP 144/90 | Resp 16 | Ht 61.0 in | Wt 182.0 lb

## 2017-07-17 DIAGNOSIS — I10 Essential (primary) hypertension: Secondary | ICD-10-CM | POA: Diagnosis not present

## 2017-07-17 DIAGNOSIS — G40219 Localization-related (focal) (partial) symptomatic epilepsy and epileptic syndromes with complex partial seizures, intractable, without status epilepticus: Secondary | ICD-10-CM

## 2017-07-17 NOTE — Patient Instructions (Signed)
1.  Continue carbamazepine 300mg  twice daily 2.  Follow up in 9 months.

## 2017-07-17 NOTE — Progress Notes (Signed)
NEUROLOGY FOLLOW UP OFFICE NOTE  Amanda Lozano [id number]  HISTORY OF PRESENT ILLNESS: Amanda Lozano is a 47 year old woman with history of hypertension and depression who presents for follow up regarding seizure disorder.     UPDATE: She take carbamazepine 300mg  twice daily.  She denies seizures.  She is feeling well.    HISTORY: Patient has a history of seizure disorder, diagnosed in 25.  However, she has experienced multiple brief "staring spells" since childhood, looking like she was daydreaming. It would only last a few seconds. In 1997, 3 months after having her second child, she had her first generalized tonic-clonic seizure. At first, it was thought to be a reaction to the Depo shot.  She denies any aura but will sometimes no fatigue. Usually they occurred at night time out of sleep. Often, she would wake up feeling fatigued. She has had some witnessed seizures and was noted to have generalized shaking with eyes rolled back. There would be a tongue laceration but no incontinence.  She was initially followed for several years by a neurologist, Dr. Micheline Maze, in [place].  Reportedly, MRIs of the brain were unremarkable. She also said she had several EEGs which were unremarkable.     She had been on carbamazepine 100mg  in morning and 200mg  in evening for many years.  High doses caused increased lethargy.  She was seizure free from generalized tonic-clonic seizures for about 7 years up until May 2014, presumably due to having missed some doses of her medication.  She will have episodes about every 6 months or so.  She has disturbed sleep with vivid dreams.  She wakes up with the same symptoms of headache, depression and sleepiness, suggesting another seizure.  This would last a couple of days.  Sometimes, she has bitten her tongue or lost bladder control.  It should be noted that she still occasionally has "silent" seizures, namely the staring spells. She does not drive.  24 hour  ambulatory EEG was performed 12/03/13 to 12/04/13, which captured several staring spells and was a normal awake and asleep study.  Stress and heavy menses are triggers.   She does have a family history of seizures on her father's side, namely her grandmother and her cousin. She had a normal birth. She has no history of meningitis or significant head trauma.    Past AEDs:  Dilantin (elevated LFTs), Topamax (fogginess), Lamictal (could not function)  MRI of brain without contrast from 12/17/16 was normal.    PAST MEDICAL HISTORY: Past Medical History:  Diagnosis Date  . High cholesterol   . History of carpal tunnel syndrome   . Hypertension   . Seizures (HCC)    Last seizure "years ago"    MEDICATIONS: Current Outpatient Medications on File Prior to Visit  Medication Sig Dispense Refill  . amLODipine (NORVASC) 5 MG tablet Take 1 tablet (5 mg total) by mouth daily. 30 tablet 1  . aspirin EC 81 MG tablet Take 81 mg by mouth every 4 (four) hours as needed (HA).    Marland Kitchen atenolol (TENORMIN) 50 MG tablet Take 3 tablets (150 mg total) by mouth daily. 90 tablet 1  . carbamazepine (TEGRETOL) 100 MG chewable tablet CHEW AND SWALLOW 3 TABLETS BY MOUTH TWICE DAILY 540 tablet 3  . ferrous sulfate 325 (65 FE) MG tablet Take 325 mg by mouth daily with breakfast.     No current facility-administered medications on file prior to visit.     ALLERGIES: No Known Allergies  FAMILY HISTORY: Family History  Problem Relation Age of Onset  . Hypertension Father   . Seizures Paternal Grandmother     SOCIAL HISTORY: Social History   Socioeconomic History  . Marital status: Divorced    Spouse name: Not on file  . Number of children: Not on file  . Years of education: Not on file  . Highest education level: Not on file  Social Needs  . Financial resource strain: Not on file  . Food insecurity - worry: Not on file  . Food insecurity - inability: Not on file  . Transportation needs - medical: Not on  file  . Transportation needs - non-medical: Not on file  Occupational History  . Not on file  Tobacco Use  . Smoking status: Never Smoker  . Smokeless tobacco: Never Used  Substance and Sexual Activity  . Alcohol use: No  . Drug use: No  . Sexual activity: Not Currently    Partners: Male    Birth control/protection: Abstinence  Other Topics Concern  . Not on file  Social History Narrative  . Not on file    REVIEW OF SYSTEMS: Constitutional: No fevers, chills, or sweats, no generalized fatigue, change in appetite Eyes: No visual changes, double vision, eye pain Ear, nose and throat: No hearing loss, ear pain, nasal congestion, sore throat Cardiovascular: No chest pain, palpitations Respiratory:  No shortness of breath at rest or with exertion, wheezes GastrointestinaI: No nausea, vomiting, diarrhea, abdominal pain, fecal incontinence Genitourinary:  No dysuria, urinary retention or frequency Musculoskeletal:  No neck pain, back pain Integumentary: No rash, pruritus, skin lesions Neurological: as above Psychiatric: No depression, insomnia, anxiety Endocrine: No palpitations, fatigue, diaphoresis, mood swings, change in appetite, change in weight, increased thirst Hematologic/Lymphatic:  No purpura, petechiae. Allergic/Immunologic: no itchy/runny eyes, nasal congestion, recent allergic reactions, rashes  PHYSICAL EXAM: Vitals:   07/17/17 1125  BP: (!) 144/90  Resp: 16   General: No acute distress.  Patient appears well-groomed.   Head:  Normocephalic/atraumatic Eyes:  Fundi examined but not visualized Neck: supple, no paraspinal tenderness, full range of motion Heart:  Regular rate and rhythm Lungs:  Clear to auscultation bilaterally Back: No paraspinal tenderness Neurological Exam: alert and oriented to person, place, and time. Attention span and concentration intact, recent and remote memory intact, fund of knowledge intact.  Speech fluent and not dysarthric, language  intact.  CN II-XII intact. Bulk and tone normal, muscle strength 5/5 throughout.  Sensation to light touch  intact.  Deep tendon reflexes 2+ throughout.  Finger to nose testing intact.  Gait normal, Romberg negative.  IMPRESSION: Seizure disorder, stable HTN  PLAN: 1.  Continue carbamazepine 300mg  twice daily 2.  She is supposed to have labs drawn soon by her PCP.  They will be sent to me.   3.  Follow up with PCP regarding blood pressure 4.  Follow up with me in 9 months.  17 minutes spent face to face with patient, over 50% spent discussing management.  Shon MilletAdam Jaffe, DO

## 2017-08-30 ENCOUNTER — Encounter: Payer: Self-pay | Admitting: Neurology

## 2018-04-09 ENCOUNTER — Ambulatory Visit: Payer: Medicaid Other | Admitting: Neurology

## 2018-04-10 NOTE — Progress Notes (Signed)
NEUROLOGY FOLLOW UP OFFICE NOTE  Amanda RibasStephanie Ostrum [id number]  HISTORY OF PRESENT ILLNESS: Amanda Lozano is a 47 year old woman with hypertension and depression who follows up for seizure disorder.  UPDATE:  She takes carbamazepine 300 mg twice daily.  She has not had any recurrent seizures.  She is feeling well.  She is exercising and doing yoga to help with anxiety.  She is engaged and plans to set a wedding date sometime next year.  HISTORY: Patient has a history of seizure disorder, diagnosed in 251997. However, she has experienced multiple brief "staring spells" since childhood, looking like she was daydreaming. It would only last a few seconds. In 1997, 3 months after having her second child, she had her first generalized tonic-clonic seizure. At first, it was thought to be a reaction to the Depo shot. She denies any aura but will sometimes no fatigue. Usually they occurred at night time out of sleep. Often, she would wake up feeling fatigued. She has had some witnessed seizures and was noted to have generalized shaking with eyes rolled back. There would be a tongue laceration but no incontinence. She was initially followed for several years by a neurologist, Dr. Micheline MazeBoyle, in [place]. Reportedly, MRIs of the brain were unremarkable. She also said she had several EEGs which were unremarkable.   She had been on carbamazepine 100mg  in morning and 200mg  in evening for many years. High doses caused increased lethargy. She was seizure free from generalized tonic-clonic seizures for about 7 years up until May 2014, presumably due to having missed some doses of her medication. She will have episodes about every 6 months or so. She has disturbed sleep with vivid dreams. She wakes up with the same symptoms of headache, depression and sleepiness, suggesting another seizure. This would last a couple of days. Sometimes, she has bitten her tongue or lost bladder control. It should be noted  that she still occasionally has "silent" seizures, namely the staring spells. She does not drive. 24 hour ambulatory EEG was performed 12/03/13 to 12/04/13, which captured several staring spells and was a normal awake and asleep study. Stress and heavy menses are triggers.  She does have a family history of seizures on her father's side, namely her grandmother and her cousin. She had a normal birth. She has no history of meningitis or significant head trauma.   Past AEDs: Dilantin (elevated LFTs), Topamax (fogginess), Lamictal (could not function)  MRI of brain without contrast from 12/17/16 was normal.   PAST MEDICAL HISTORY: Past Medical History:  Diagnosis Date  . High cholesterol   . History of carpal tunnel syndrome   . Hypertension   . Seizures (HCC)    Last seizure "years ago"    MEDICATIONS: Current Outpatient Medications on File Prior to Visit  Medication Sig Dispense Refill  . amLODipine (NORVASC) 5 MG tablet Take 1 tablet (5 mg total) by mouth daily. 30 tablet 1  . aspirin EC 81 MG tablet Take 81 mg by mouth every 4 (four) hours as needed (HA).    Marland Kitchen. atenolol (TENORMIN) 50 MG tablet Take 3 tablets (150 mg total) by mouth daily. 90 tablet 1  . carbamazepine (TEGRETOL) 100 MG chewable tablet CHEW AND SWALLOW 3 TABLETS BY MOUTH TWICE DAILY 540 tablet 3  . ferrous sulfate 325 (65 FE) MG tablet Take 325 mg by mouth daily with breakfast.     No current facility-administered medications on file prior to visit.     ALLERGIES: No Known Allergies  FAMILY HISTORY: Family History  Problem Relation Age of Onset  . Hypertension Father   . Seizures Paternal Grandmother     SOCIAL HISTORY: Social History   Socioeconomic History  . Marital status: Divorced    Spouse name: Not on file  . Number of children: Not on file  . Years of education: Not on file  . Highest education level: Not on file  Occupational History  . Not on file  Social Needs  . Financial resource  strain: Not on file  . Food insecurity:    Worry: Not on file    Inability: Not on file  . Transportation needs:    Medical: Not on file    Non-medical: Not on file  Tobacco Use  . Smoking status: Never Smoker  . Smokeless tobacco: Never Used  Substance and Sexual Activity  . Alcohol use: No  . Drug use: No  . Sexual activity: Not Currently    Partners: Male    Birth control/protection: Abstinence  Lifestyle  . Physical activity:    Days per week: Not on file    Minutes per session: Not on file  . Stress: Not on file  Relationships  . Social connections:    Talks on phone: Not on file    Gets together: Not on file    Attends religious service: Not on file    Active member of club or organization: Not on file    Attends meetings of clubs or organizations: Not on file    Relationship status: Not on file  . Intimate partner violence:    Fear of current or ex partner: Not on file    Emotionally abused: Not on file    Physically abused: Not on file    Forced sexual activity: Not on file  Other Topics Concern  . Not on file  Social History Narrative  . Not on file    REVIEW OF SYSTEMS: Constitutional: No fevers, chills, or sweats, no generalized fatigue, change in appetite Eyes: No visual changes, double vision, eye pain Ear, nose and throat: No hearing loss, ear pain, nasal congestion, sore throat Cardiovascular: No chest pain, palpitations Respiratory:  No shortness of breath at rest or with exertion, wheezes GastrointestinaI: No nausea, vomiting, diarrhea, abdominal pain, fecal incontinence Genitourinary:  No dysuria, urinary retention or frequency Musculoskeletal:  No neck pain, back pain Integumentary: No rash, pruritus, skin lesions Neurological: as above Psychiatric: some mild depression and anxiety Endocrine: No palpitations, fatigue, diaphoresis, mood swings, change in appetite, change in weight, increased thirst Hematologic/Lymphatic:  No purpura,  petechiae. Allergic/Immunologic: no itchy/runny eyes, nasal congestion, recent allergic reactions, rashes  PHYSICAL EXAM: Blood pressure (!) 136/92, pulse 80, height 5\' 2"  (1.575 m), weight 180 lb (81.6 kg), SpO2 100 %, unknown if currently breastfeeding. General: No acute distress.  Patient appears well-groomed.  Head:  Normocephalic/atraumatic Eyes:  Fundi examined but not visualized Neck: supple, no paraspinal tenderness, full range of motion Heart:  Regular rate and rhythm Lungs:  Clear to auscultation bilaterally Back: No paraspinal tenderness Neurological Exam: alert and oriented to person, place, and time. Attention span and concentration intact, recent and remote memory intact, fund of knowledge intact.  Speech fluent and not dysarthric, language intact.  CN II-XII intact. Bulk and tone normal, muscle strength 5/5 throughout.  Sensation to light touch intact.  Deep tendon reflexes 2+ throughout, toes downgoing.  Finger to nose testing intact.  Gait normal, Romberg negative.  IMPRESSION: Seizure disorder stable  PLAN: 1.  Continue carbamazepine 300 mg twice daily. 2.  She is seeing her PCP next week and will have labs drawn at that time.  Requested that they be faxed to our office 3.  Follow up in one year.  18 minutes spent face to face with patient, over 50% spent discussing management.  Shon Millet, DO  CC:  [name], MD

## 2018-04-11 ENCOUNTER — Ambulatory Visit: Payer: Medicaid Other | Admitting: Neurology

## 2018-04-11 ENCOUNTER — Encounter: Payer: Self-pay | Admitting: Neurology

## 2018-04-11 VITALS — BP 136/92 | HR 80 | Ht 62.0 in | Wt 180.0 lb

## 2018-04-11 DIAGNOSIS — G40019 Localization-related (focal) (partial) idiopathic epilepsy and epileptic syndromes with seizures of localized onset, intractable, without status epilepticus: Secondary | ICD-10-CM | POA: Diagnosis not present

## 2018-04-11 NOTE — Patient Instructions (Signed)
1.  Continue carbamazepine 300mg  twice daily 2.  Have your labs sent over to me 3.  Follow up in one year 4.  Congratulations on your engagement!

## 2018-04-18 ENCOUNTER — Ambulatory Visit: Payer: Medicaid Other | Admitting: Neurology

## 2018-05-13 ENCOUNTER — Telehealth: Payer: Self-pay | Admitting: Neurology

## 2018-05-13 NOTE — Telephone Encounter (Signed)
Called and explained to Pt that Dr Everlena Cooper is out of the office and when he returns, I will forward the question to him.

## 2018-05-13 NOTE — Telephone Encounter (Signed)
Paperwork for work; Her epilepsy is doing better with her working day shifts. She needs a form for work that states she has to work day shifts if possible. Please write this up if you can. She will come pick up. Thanks!

## 2018-05-26 NOTE — Telephone Encounter (Signed)
We can provide a letter stating such  

## 2018-05-26 NOTE — Telephone Encounter (Signed)
Pt is asking for a letter stating her being able to work daytime hours is better for her epilepsy

## 2018-05-27 ENCOUNTER — Telehealth: Payer: Self-pay | Admitting: Neurology

## 2018-05-27 DIAGNOSIS — G40219 Localization-related (focal) (partial) symptomatic epilepsy and epileptic syndromes with complex partial seizures, intractable, without status epilepticus: Secondary | ICD-10-CM

## 2018-05-27 MED ORDER — CARBAMAZEPINE 100 MG PO CHEW: 300.0000 mg | CHEWABLE_TABLET | Freq: Two times a day (BID) | ORAL | 3 refills | Status: DC

## 2018-05-27 NOTE — Telephone Encounter (Signed)
Patient left a vm about needing to speak with someone in regards to her seizure medication. Please call her back at [phone]. Thanks!

## 2018-05-27 NOTE — Telephone Encounter (Signed)
Called and spoke with Pt. 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 Also advised her she can p/u letter stating it is better for her to work 1st shift

## 2018-07-31 ENCOUNTER — Telehealth: Payer: Self-pay | Admitting: Neurology

## 2018-07-31 NOTE — Telephone Encounter (Signed)
Fax #[phone];  Please put her employee ID #:[id number].

## 2018-07-31 NOTE — Telephone Encounter (Signed)
Patient is needing a note for work stating how fragile she is with her getting pneumonia 2xs a year and then being epileptic. Please get this faxed to: (she will call back with the fax #). Thanks!

## 2018-07-31 NOTE — Telephone Encounter (Signed)
She didn't have fax number. You will need to call [employer] at [id number] [place] [facility] at [phone]. Thanks! And she said to call her before you fax it.

## 2018-08-01 NOTE — Telephone Encounter (Signed)
She needs to talk with her PCP regarding her anxiety (or psychiatrist/therapist if she has one).  The issue is the anxiety and not the seizures.

## 2018-08-01 NOTE — Telephone Encounter (Signed)
Called and spoke with Pt, advised her to call PCP. She asked to come by and p/u office notes. I advised her they will be at the front desk along with ROI to sign.

## 2018-08-01 NOTE — Telephone Encounter (Signed)
I spoke with Pt and advised her that per Dr. Everlena Cooper, he does not feel it is necessary for her to not work due to her seizure disorder, and if it is the the issues of pneumonia, she should contact her PCP.  Pt states her anxiety is high and she feels it may cause a seizure. She wants to speak directly to Dr. Everlena Cooper. I advised her I discussed this with him, he saw her message. She asked for her notes showing her medical diagnosis to show her employer. I advised her I will make Dr. Everlena Cooper aware and contact her.

## 2018-08-14 ENCOUNTER — Ambulatory Visit: Payer: Medicaid Other | Admitting: Neurology

## 2018-08-14 ENCOUNTER — Encounter

## 2018-10-05 IMAGING — CR DG CHEST 2V
2 series · 2 of 2 positions shown · non-contrast
Comparison: 12/18/2016

CLINICAL DATA: Shortness of breath

EXAM:
CHEST  2 VIEW

[w chest pa]
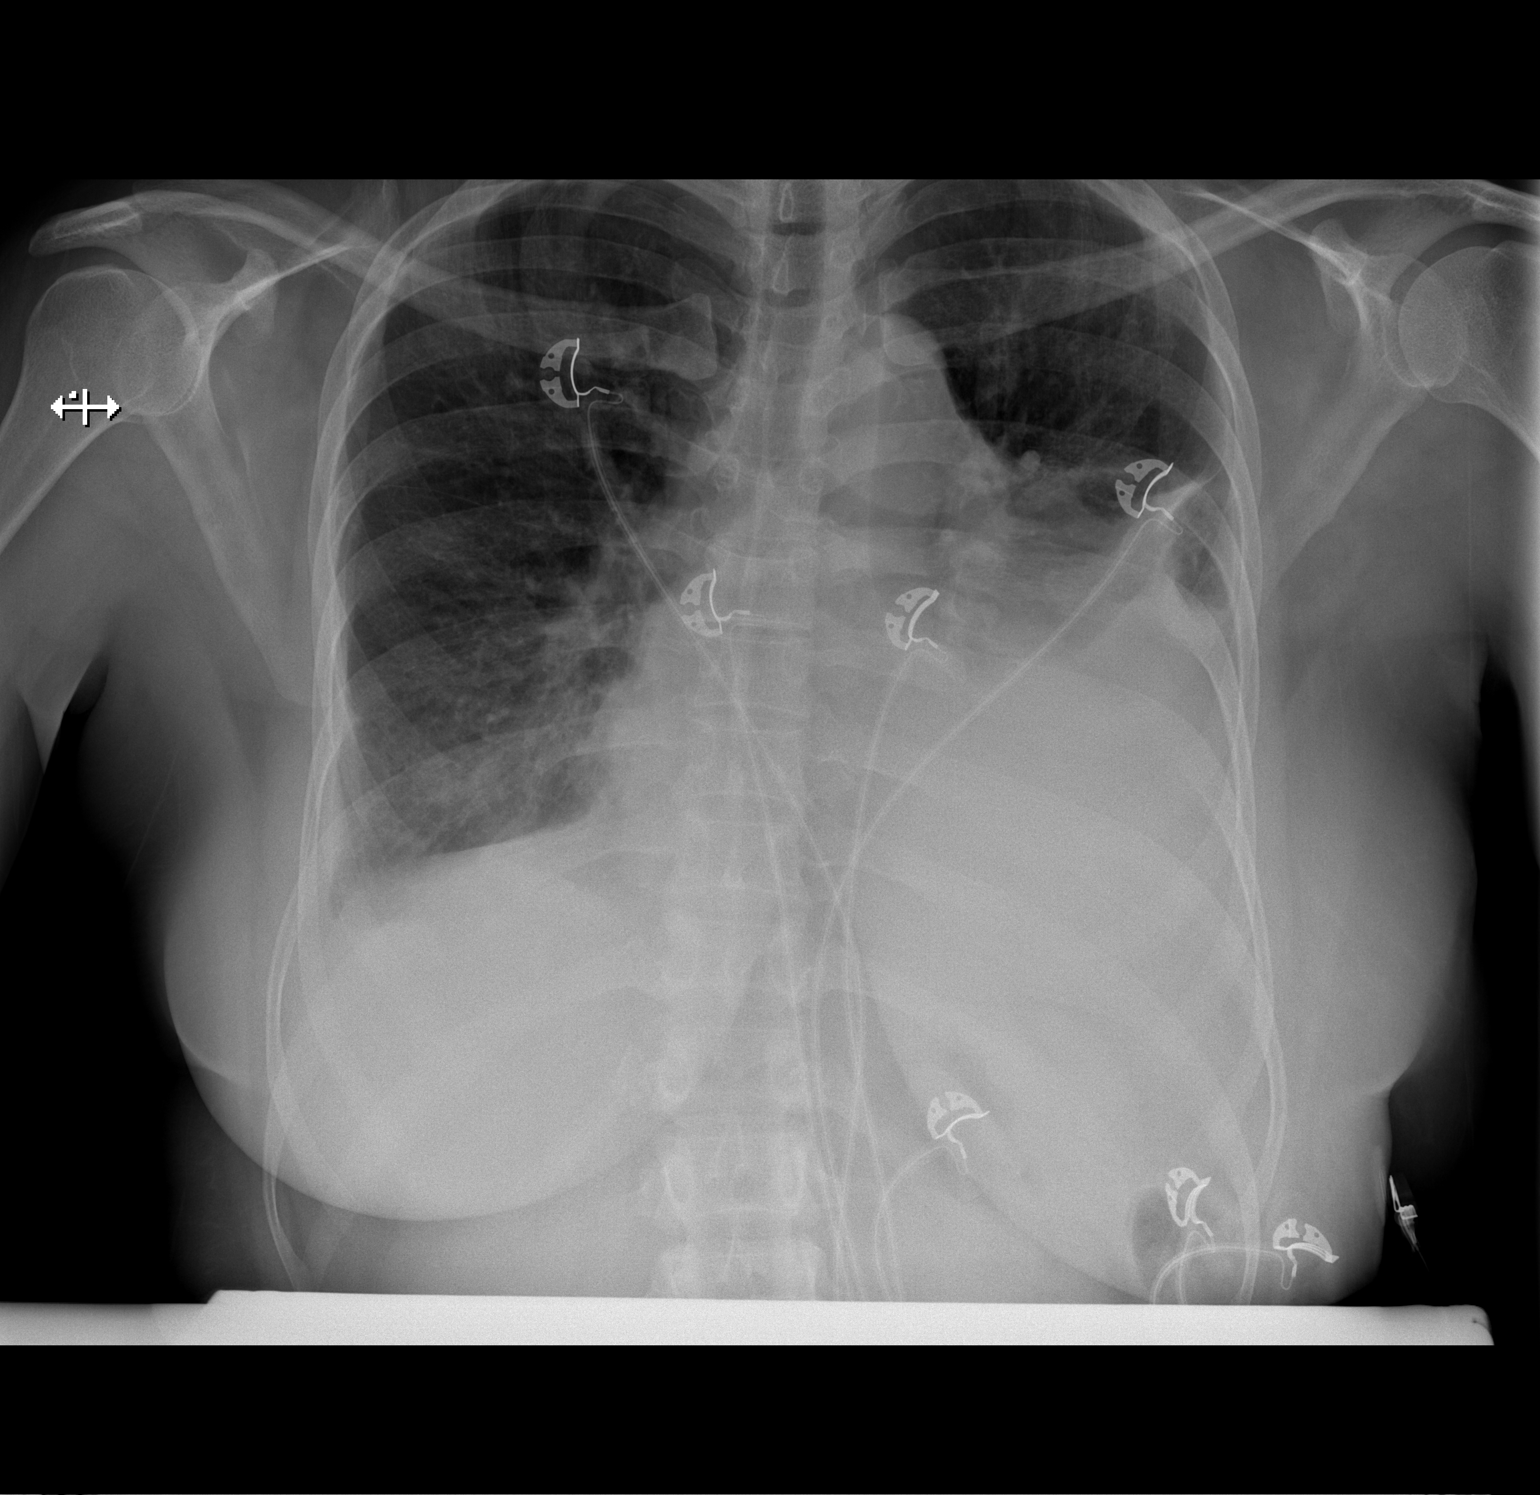

[w chest lat]
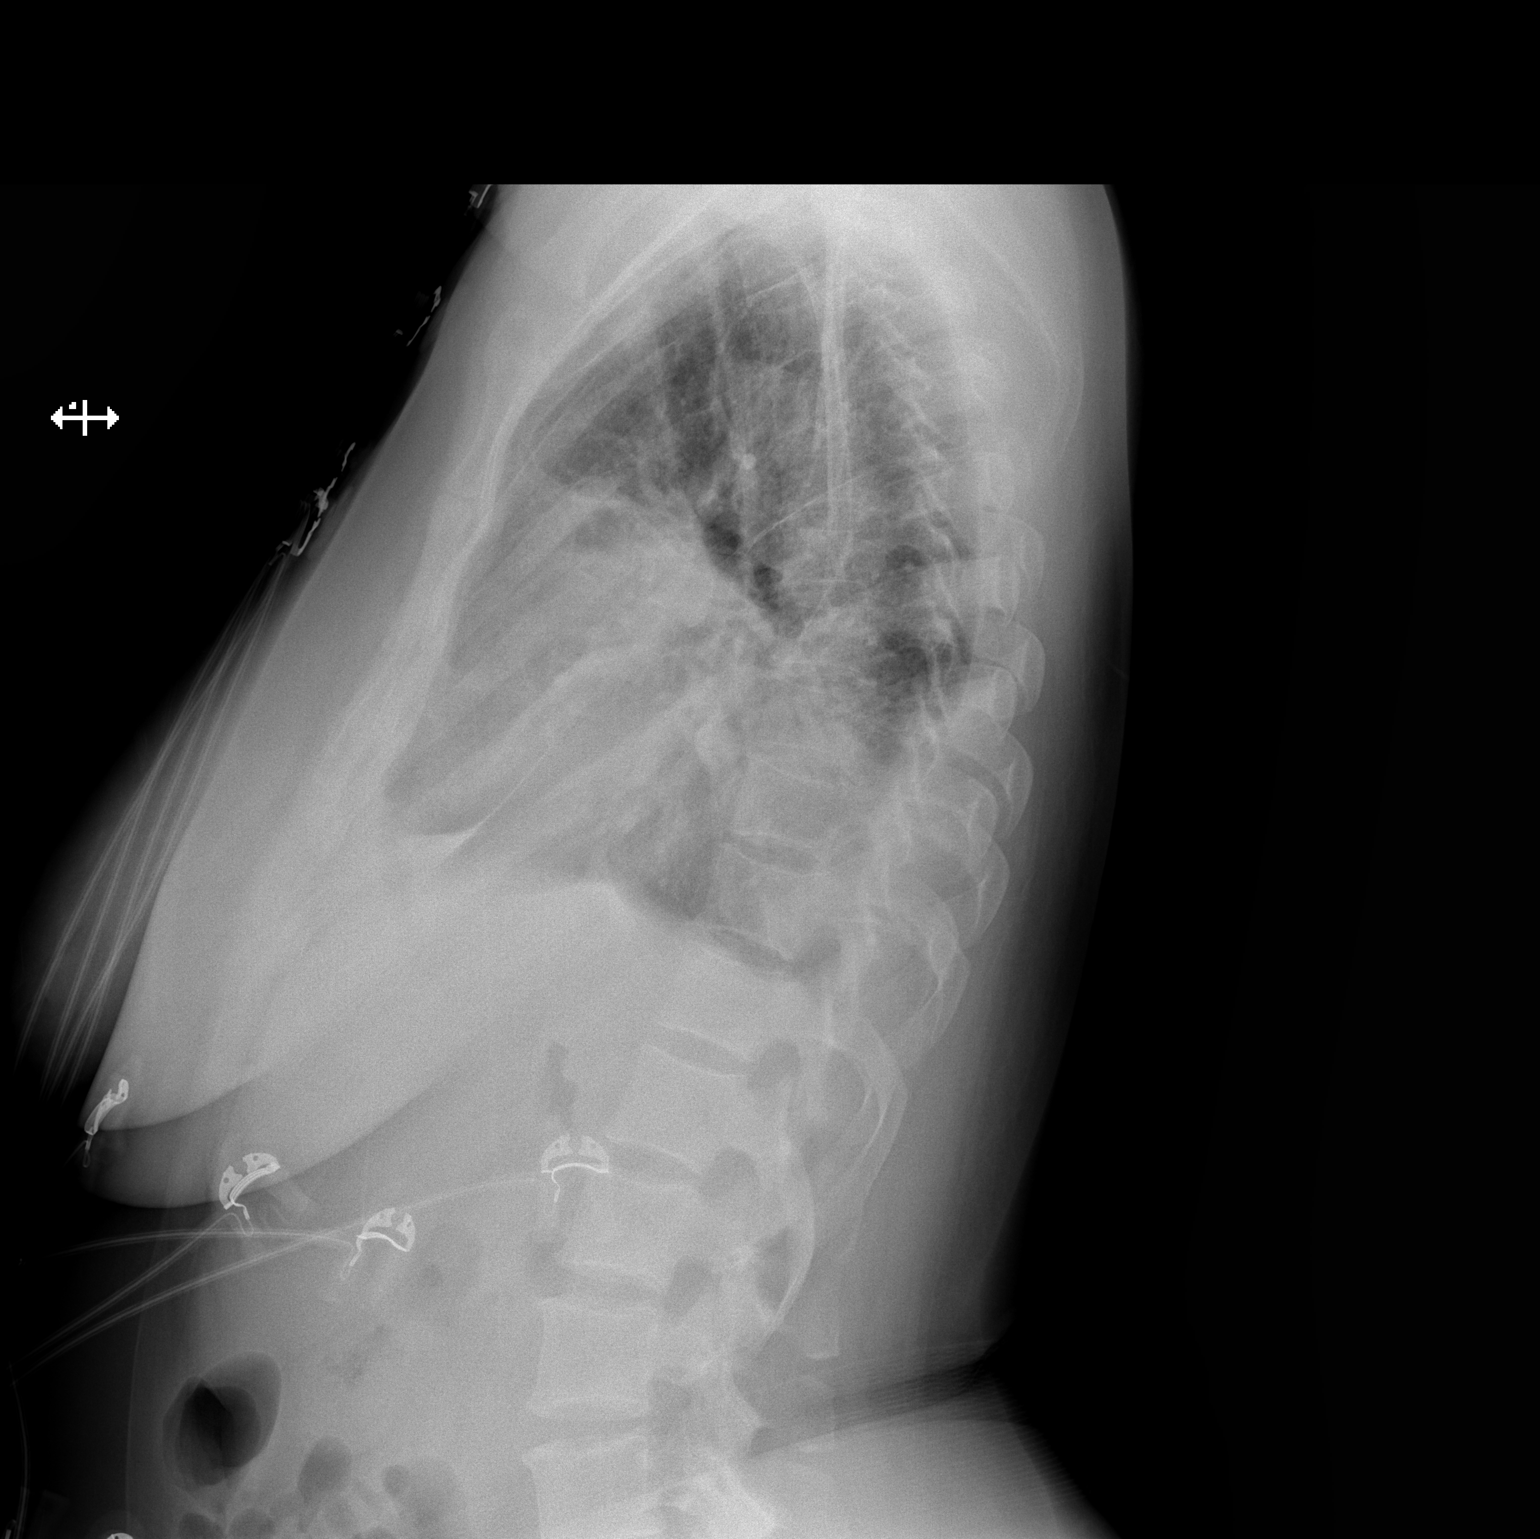

[2 of 2 positions shown; findings below may reference images not displayed]

FINDINGS: Moderate left pleural effusion with some fluid extending along the
fissure. Tiny right pleural effusion. Patchy atelectasis or
infiltrate at the right base with persistent consolidation at the
left lung base. Cardiomegaly with central vascular congestion. No
pneumothorax.
IMPRESSION: 1. Moderate left pleural effusion.  Small right pleural effusion
2. Persisting consolidation at the left lung base may reflect
atelectasis or pneumonia. Hazy right infrahilar atelectasis or
infiltrate
3. Cardiomegaly with central vascular congestion

## 2018-10-06 ENCOUNTER — Telehealth: Payer: Self-pay | Admitting: Neurology

## 2018-10-06 DIAGNOSIS — G40219 Localization-related (focal) (partial) symptomatic epilepsy and epileptic syndromes with complex partial seizures, intractable, without status epilepticus: Secondary | ICD-10-CM

## 2018-10-06 IMAGING — US US THORACENTESIS ASP PLEURAL SPACE W/IMG GUIDE
1 series · 4 of 4 positions shown · non-contrast
Comparison: none

INDICATION: Patient with history of dyspnea, cough, pericardial effusion, left
greater than right pleural effusions, recurrent pneumonia. Request
made for diagnostic and therapeutic left thoracentesis.

[Series 1: us thoracentesis asp pleural space w/img guide · 0.23mm/px · 4 of 4 slices shown]
[im 1/4]
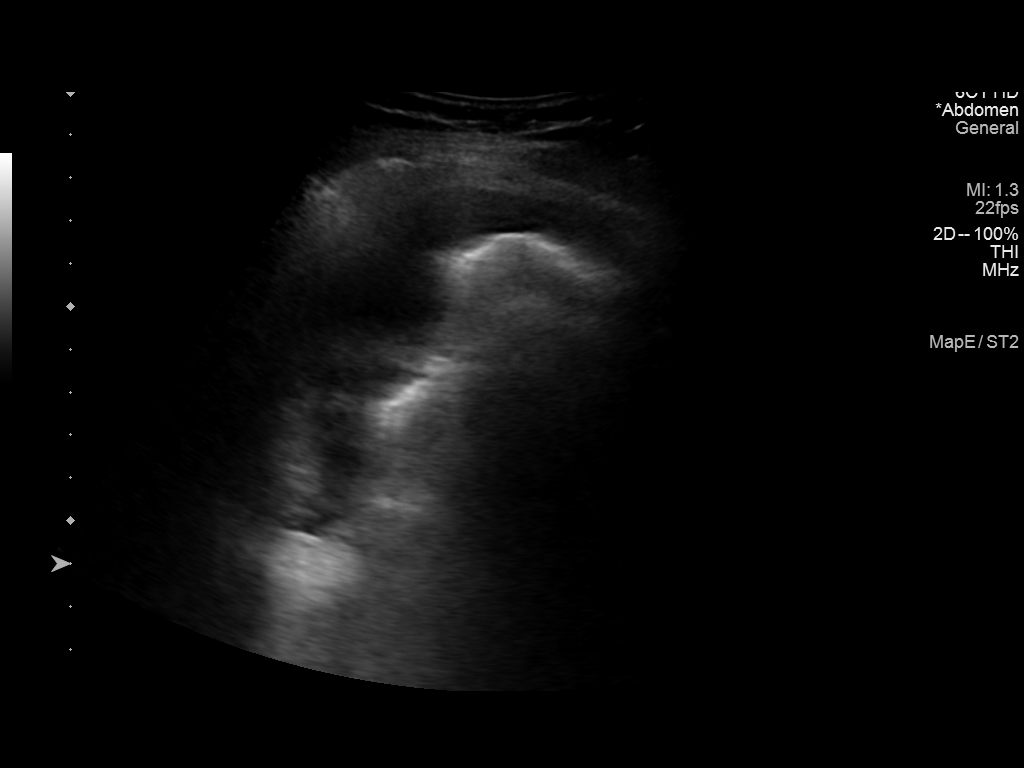
[im 2/4]
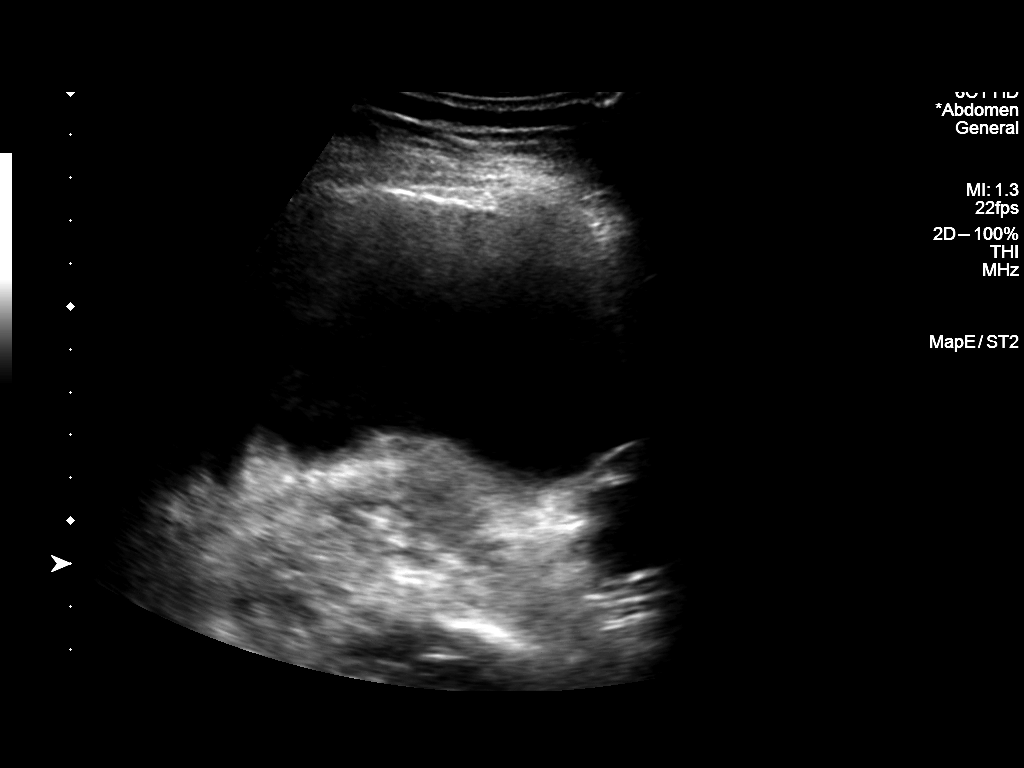
[im 3/4]
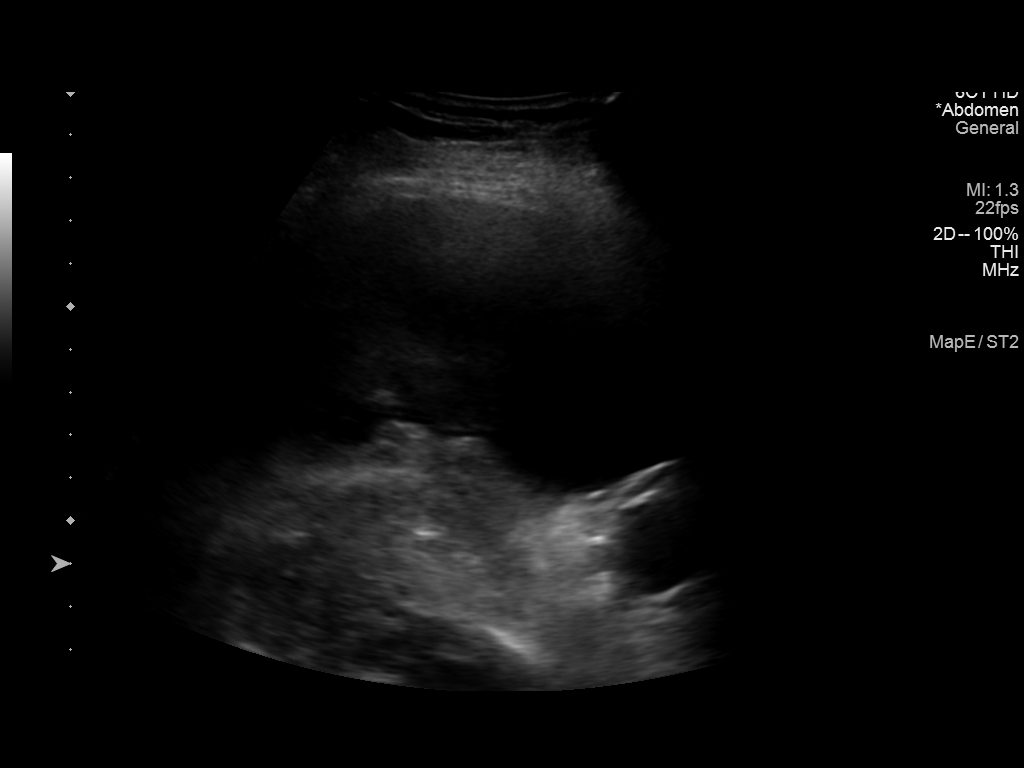
[im 4/4]
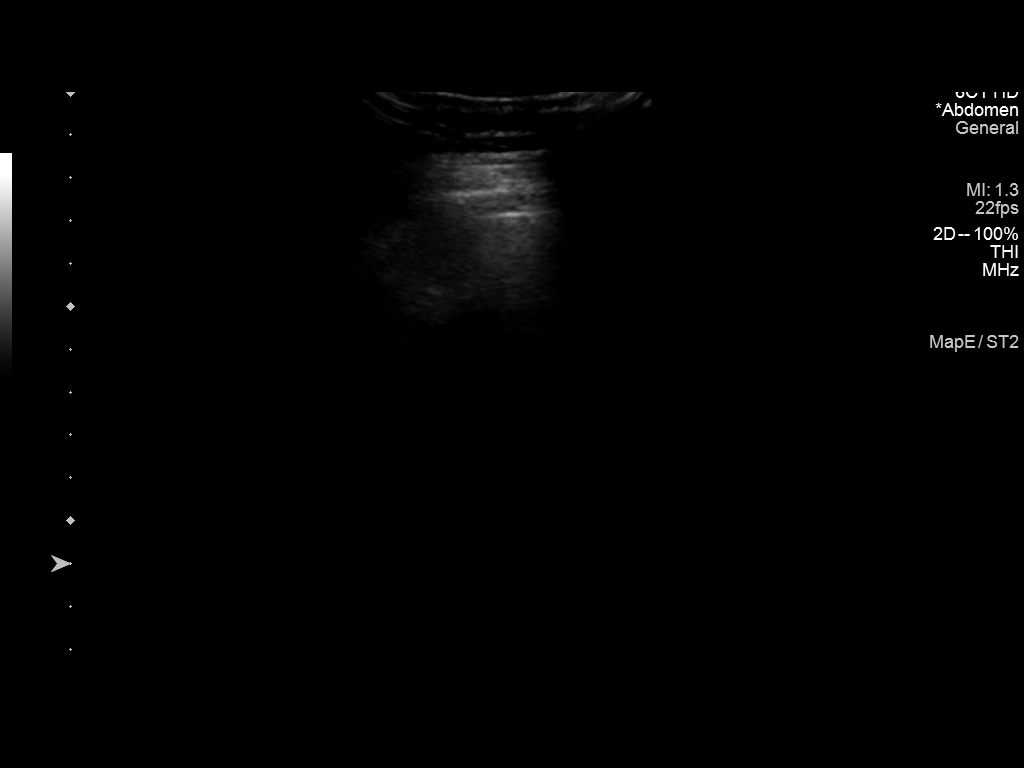

[4 of 4 positions shown; findings below may reference images not displayed]

EXAM:
ULTRASOUND GUIDED DIAGNOSTIC AND THERAPEUTIC LEFT THORACENTESIS

MEDICATIONS:
None.

COMPLICATIONS:
None immediate.

PROCEDURE:
An ultrasound guided thoracentesis was thoroughly discussed with the
patient and questions answered. The benefits, risks, alternatives
and complications were also discussed. The patient understands and
wishes to proceed with the procedure. Written consent was obtained.

Ultrasound was performed to localize and mark an adequate pocket of
fluid in the left chest. The area was then prepped and draped in the
normal sterile fashion. 1% Lidocaine was used for local anesthesia.
Under ultrasound guidance a Safe-T-Centesis catheter was introduced.
Thoracentesis was performed. The catheter was removed and a dressing
applied.
FINDINGS: A total of approximately 630 cc of slightly hazy, yellow fluid was
removed. Samples were sent to the laboratory as requested by the
clinical team.
IMPRESSION: Successful ultrasound guided diagnostic and therapeutic left
thoracentesis yielding 630 cc of pleural fluid. Follow-up chest
x-ray revealed no pneumothorax.

## 2018-10-06 IMAGING — CR DG CHEST 1V
2 series · 2 of 2 positions shown · non-contrast
Comparison: 02/06/2017

CLINICAL DATA: Status post left thoracentesis.

EXAM:
CHEST 1 VIEW

[w chest pa (1 of 2)]
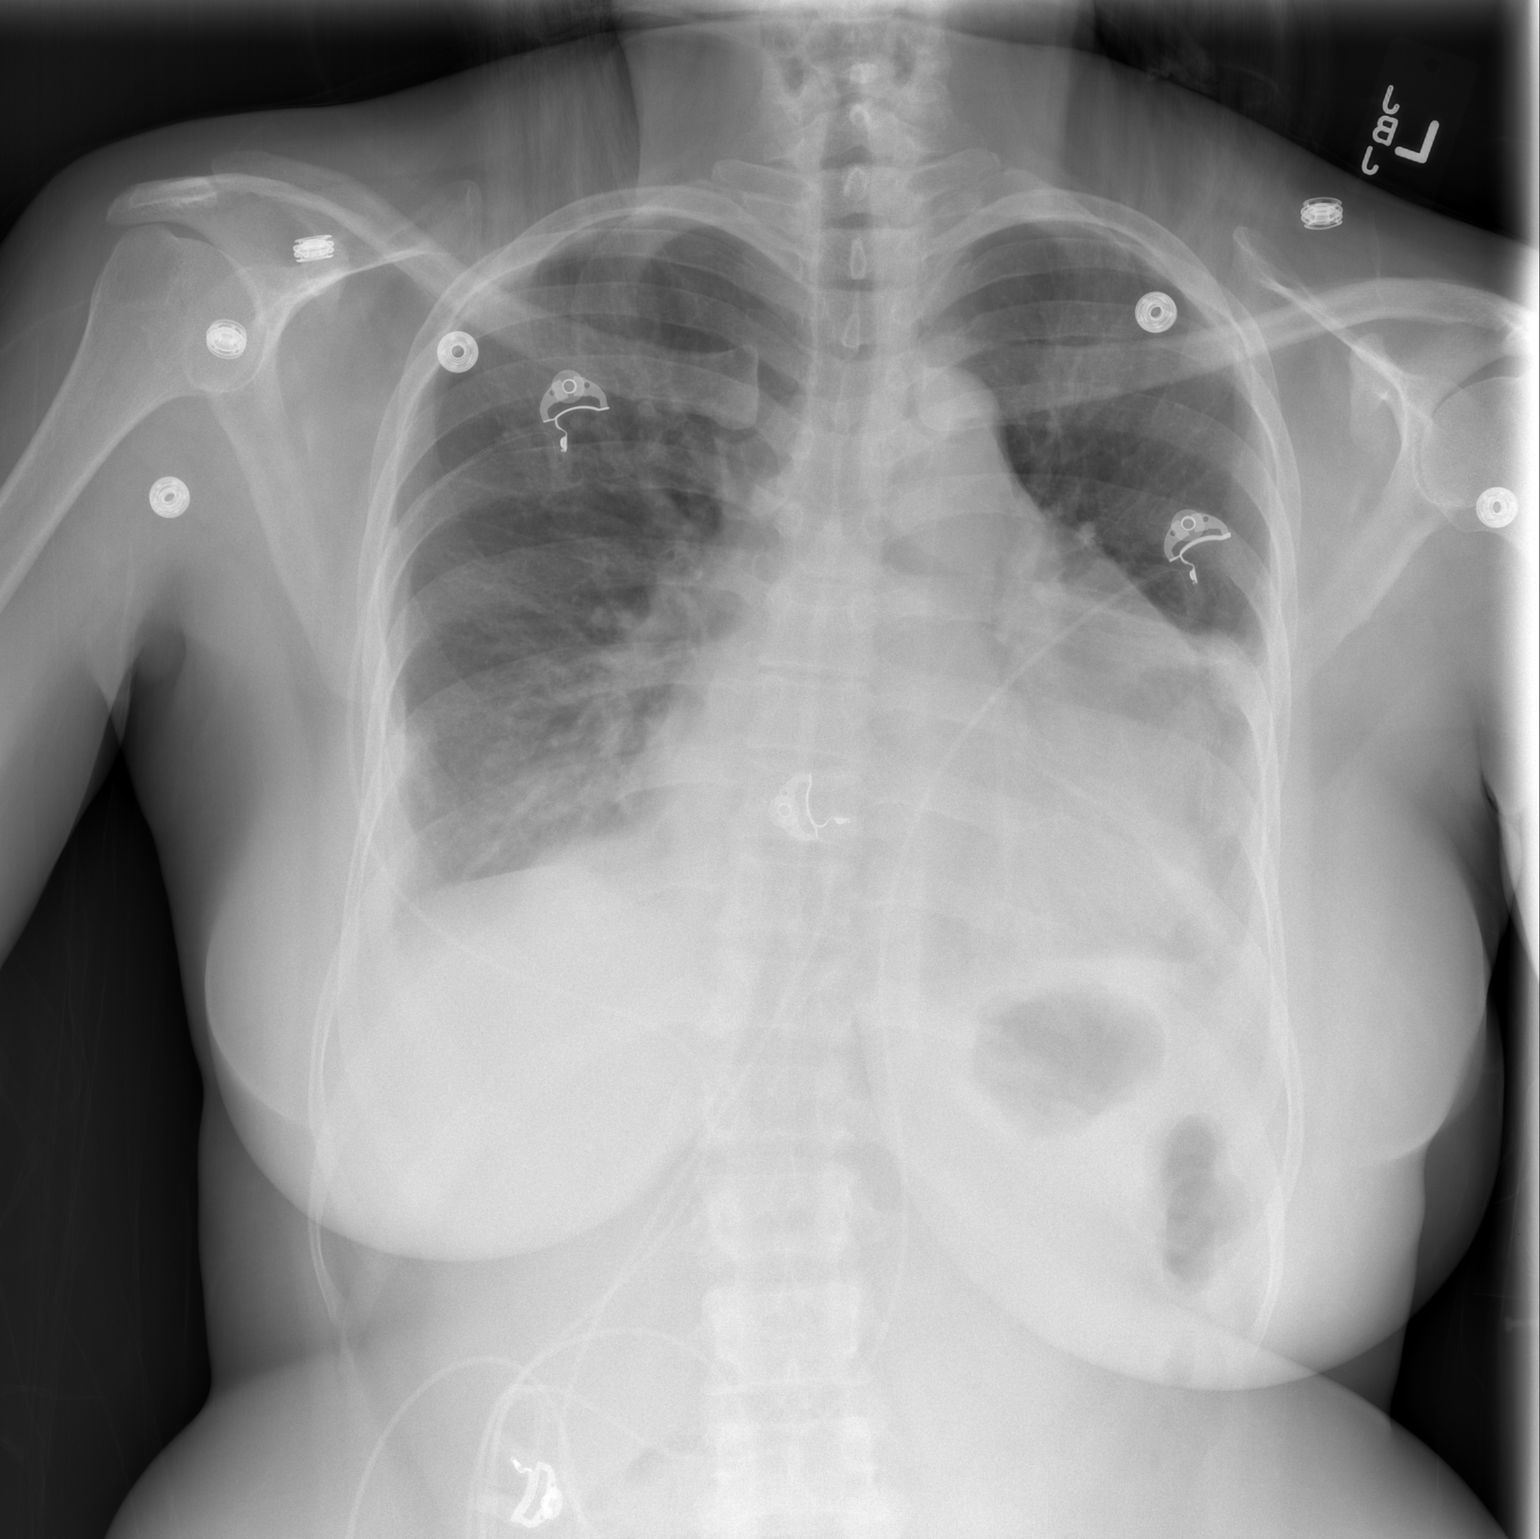

[w chest pa (2 of 2)]
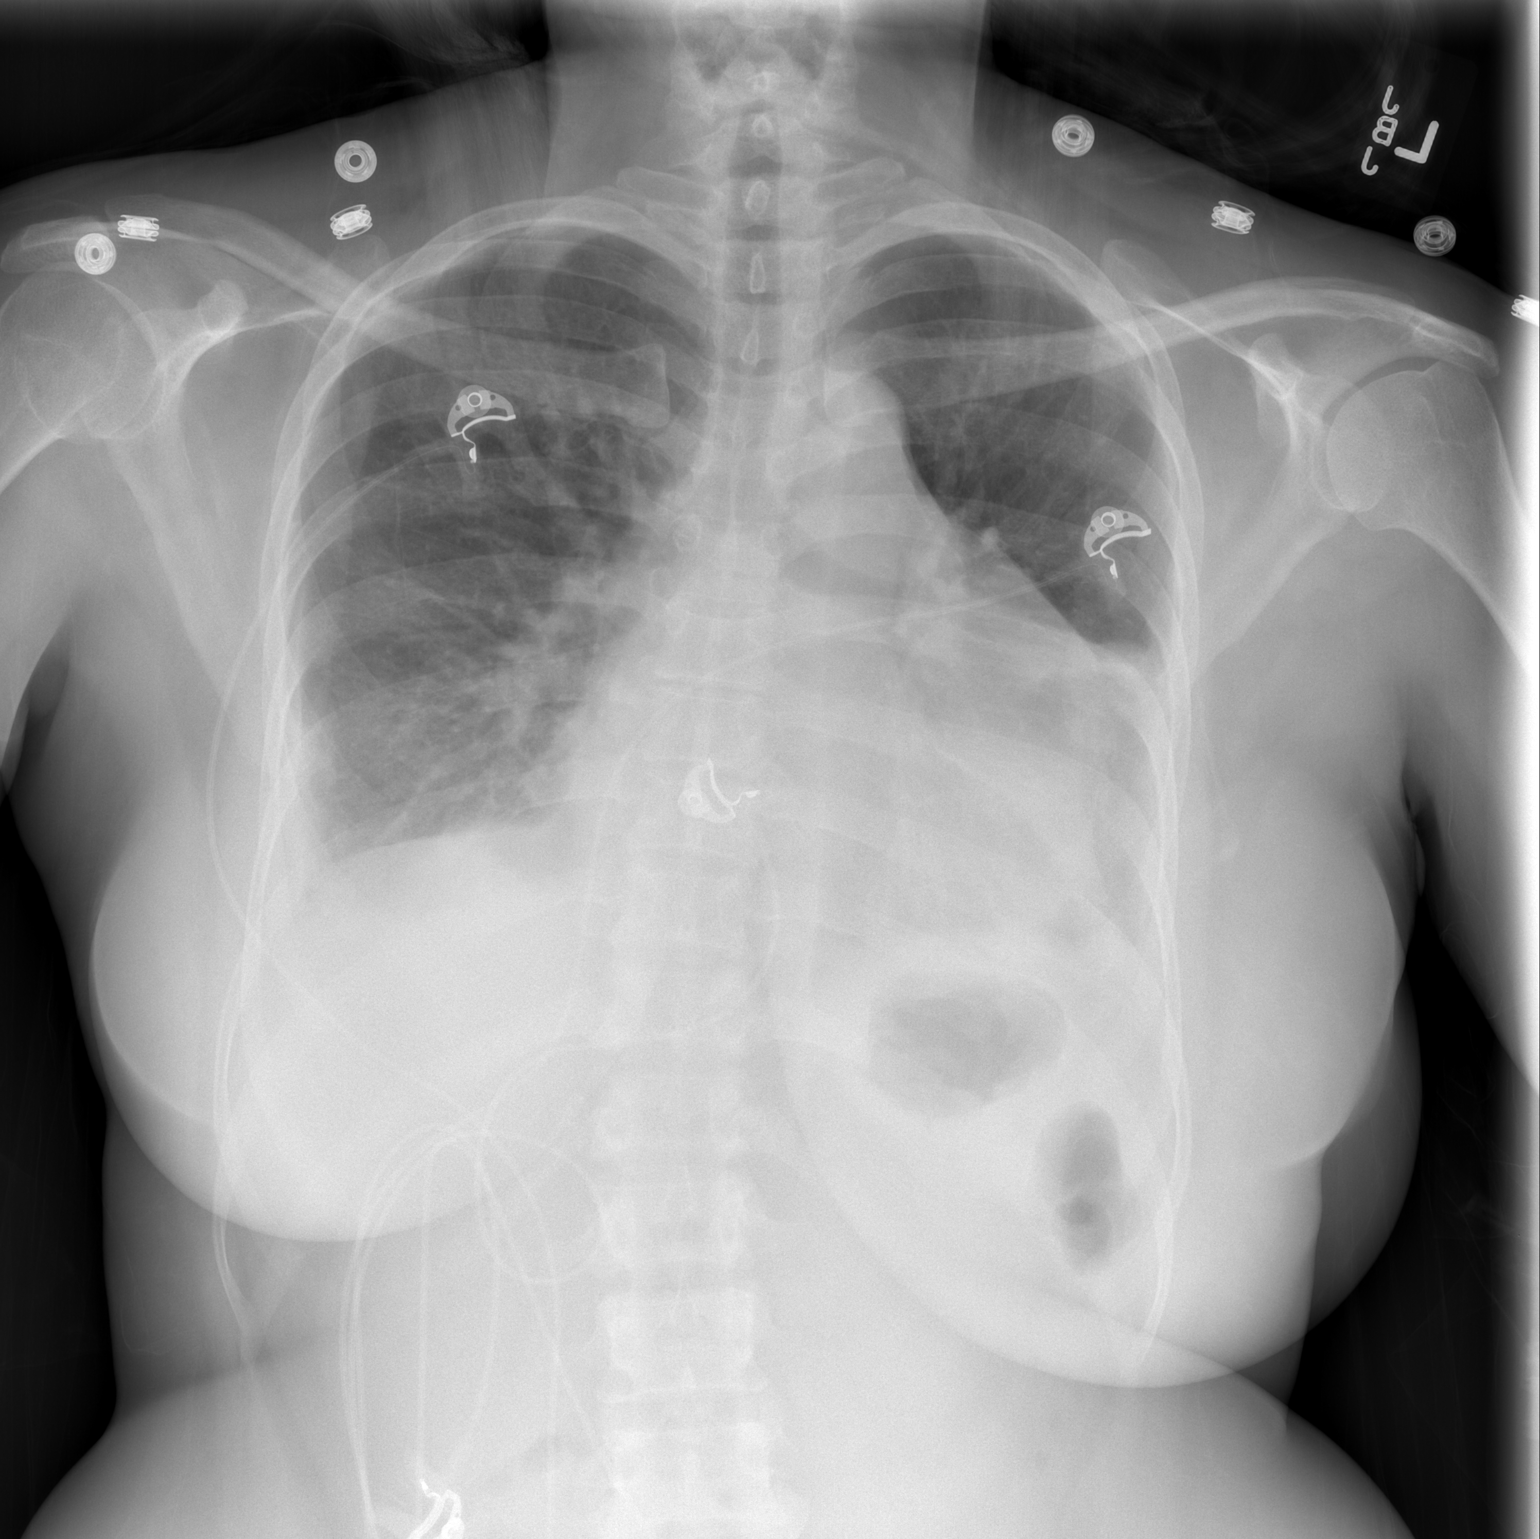

[2 of 2 positions shown; findings below may reference images not displayed]

FINDINGS: Markedly improved aeration in the left lower chest compatible with
recent left thoracentesis. Mild blunting at the right costophrenic
angle compatible with a small amount of right pleural fluid. Again
noted is enlargement of the cardiac silhouette. Residual densities
in the mid and lower chest are suggestive for atelectasis. Central
vascular structures are prominent and similar to the previous
examination. Negative for a pneumothorax.
IMPRESSION: Markedly decreased or resolved left pleural effusion following the
thoracentesis. Negative for pneumothorax.

Small right pleural effusion.

Persistent enlargement of the cardiac silhouette. Known pericardial
effusion based on recent CT.

## 2018-10-06 MED ORDER — CARBAMAZEPINE 100 MG PO CHEW: 300.0000 mg | CHEWABLE_TABLET | Freq: Two times a day (BID) | ORAL | 3 refills | Status: DC

## 2018-10-06 NOTE — Telephone Encounter (Signed)
Pt wants her medication for her seizures carbamazepine called in to her new pharmacy [facility] on [address]

## 2018-12-27 ENCOUNTER — Other Ambulatory Visit: Payer: Self-pay | Admitting: Neurology

## 2018-12-27 DIAGNOSIS — G40219 Localization-related (focal) (partial) symptomatic epilepsy and epileptic syndromes with complex partial seizures, intractable, without status epilepticus: Secondary | ICD-10-CM

## 2019-04-09 NOTE — Progress Notes (Signed)
Virtual Visit via Video Note The purpose of this virtual visit is to provide medical care while limiting exposure to the novel coronavirus.    Consent was obtained for video visit:  Yes.   Answered questions that patient had about telehealth interaction:  Yes.   I discussed the limitations, risks, security and privacy concerns of performing an evaluation and management service by telemedicine. I also discussed with the patient that there may be a patient responsible charge related to this service. The patient expressed understanding and agreed to proceed.  Pt location: Home Physician Location: office Name of referring provider:  Jackie Plumsei-Bonsu, George, MD I connected with Amanda RibasStephanie Lozano at patients initiation/request on 04/13/2019 at  8:50 AM EST by video enabled telemedicine application and verified that I am speaking with the correct person using two identifiers. Pt MRN:  [id number] Pt DOB:  31-Aug-1970 Video Participants:  Amanda Lozano   History of Present Illness:  Amanda Lozano is a 48 year old woman with hypertension and depression who follows up for seizure disorder.  UPDATE:  She takes carbamazepine 300 mg twice daily.  Since last visit, she has gotten married.  She has not had any recurrent seizures.  HISTORY: Patient has a history of seizure disorder, diagnosed in [id number].However, she has experienced multiple brief "staring spells" since childhood, looking like she was daydreaming. It would only last a few seconds. In 1997, 3 months after having her second child, she had her first generalized tonic-clonic seizure. At first, it was thought to be a reaction to the Depo shot.She denies any aura but will sometimes no fatigue. Usually they occurred at night time out of sleep. Often, she would wake up feeling fatigued. She has had some witnessed seizures and was noted to have generalized shaking with eyes rolled back. There would be a tongue laceration but no incontinence.She was  initially followed for several years by a neurologist, Dr. Micheline MazeBoyle, in [place].Reportedly, MRIs of the brain were unremarkable. She also said she had several EEGs which were unremarkable.  She had been on carbamazepine 100mg  in morning and 200mg  in evening for many years.High doses caused increased lethargy.She was seizure free from generalized tonic-clonic seizures for about 7 years up until May 2014, presumably due to having missed some doses of her medication.She will have episodes about every 6 months or so.She has disturbed sleep with vivid dreams.She wakes up with the same symptoms of headache, depression and sleepiness, suggesting another seizure.This would last a couple of days.Sometimes, she has bitten her tongue or lost bladder control.It should be noted that she still occasionally has "silent" seizures, namely the staring spells. She does not drive.24 hour ambulatory EEG was performed 12/03/13 to 12/04/13, which captured several staring spells and was a normal awake and asleep study.Stress and heavy menses are triggers.  She does have a family history of seizures on her father's side, namely her grandmother and her cousin. She had a normal birth. She has no history of meningitis or significant head trauma.   Past AEDs:Dilantin (elevated LFTs), Topamax (fogginess), Lamictal (could not function)  MRI of brain without contrast from 12/17/16 was normal.  Past Medical History: Past Medical History:  Diagnosis Date  . High cholesterol   . History of carpal tunnel syndrome   . Hypertension   . Seizures (HCC)    Last seizure "years ago"    Medications: Outpatient Encounter Medications as of 04/13/2019  Medication Sig  . amLODipine (NORVASC) 5 MG tablet Take 1 tablet (5 mg total) by  mouth daily.  Marland Kitchen aspirin EC 81 MG tablet Take 81 mg by mouth every 4 (four) hours as needed (HA).  Marland Kitchen atenolol (TENORMIN) 50 MG tablet Take 3 tablets (150 mg total) by mouth  daily.  . carbamazepine (TEGRETOL) 100 MG chewable tablet CHEW AND SWALLOW 3 TABLETS BY MOUTH TWICE DAILY  . carbamazepine (TEGRETOL) 100 MG chewable tablet CHEW 3 TABLETS BY MOUTH 2 TIMES DAILY.  . ferrous sulfate 325 (65 FE) MG tablet Take 325 mg by mouth daily with breakfast.   No facility-administered encounter medications on file as of 04/13/2019.     Allergies: No Known Allergies  Family History: Family History  Problem Relation Age of Onset  . Hypertension Father   . Seizures Paternal Grandmother     Social History: Social History   Socioeconomic History  . Marital status: Divorced    Spouse name: Not on file  . Number of children: Not on file  . Years of education: Not on file  . Highest education level: Not on file  Occupational History  . Not on file  Social Needs  . Financial resource strain: Not on file  . Food insecurity    Worry: Not on file    Inability: Not on file  . Transportation needs    Medical: Not on file    Non-medical: Not on file  Tobacco Use  . Smoking status: Never Smoker  . Smokeless tobacco: Never Used  Substance and Sexual Activity  . Alcohol use: No  . Drug use: No  . Sexual activity: Not Currently    Partners: Male    Birth control/protection: Abstinence  Lifestyle  . Physical activity    Days per week: Not on file    Minutes per session: Not on file  . Stress: Not on file  Relationships  . Social Musician on phone: Not on file    Gets together: Not on file    Attends religious service: Not on file    Active member of club or organization: Not on file    Attends meetings of clubs or organizations: Not on file    Relationship status: Not on file  . Intimate partner violence    Fear of current or ex partner: Not on file    Emotionally abused: Not on file    Physically abused: Not on file    Forced sexual activity: Not on file  Other Topics Concern  . Not on file  Social History Narrative  . Not on file     Observations/Objective:   Height 5\' 2"  (1.575 m), weight 180 lb (81.6 kg), unknown if currently breastfeeding. No acute distress.  Alert and oriented.  Speech fluent and not dysarthric.  Language intact.  Eyes orthophoric on primary gaze.  Face symmetric.  Assessment and Plan:   Seizure disorder, stable  1.  Carbamazepine 300 mg twice daily.  Obtain most recent labs (CBC and CMP) from PCP. 2.  Follow-up in 1 year  Follow Up Instructions:    -I discussed the assessment and treatment plan with the patient. The patient was provided an opportunity to ask questions and all were answered. The patient agreed with the plan and demonstrated an understanding of the instructions.   The patient was advised to call back or seek an in-person evaluation if the symptoms worsen or if the condition fails to improve as anticipated.    Total Time spent in visit with the patient was:  15 minutes   Tomi Likens, DO

## 2019-04-13 ENCOUNTER — Other Ambulatory Visit: Payer: Self-pay

## 2019-04-13 ENCOUNTER — Encounter: Payer: Self-pay | Admitting: Neurology

## 2019-04-13 ENCOUNTER — Telehealth (INDEPENDENT_AMBULATORY_CARE_PROVIDER_SITE_OTHER): Payer: Medicaid Other | Admitting: Neurology

## 2019-04-13 VITALS — Ht 62.0 in | Wt 180.0 lb

## 2019-04-13 DIAGNOSIS — G40219 Localization-related (focal) (partial) symptomatic epilepsy and epileptic syndromes with complex partial seizures, intractable, without status epilepticus: Secondary | ICD-10-CM | POA: Diagnosis not present

## 2019-04-29 ENCOUNTER — Other Ambulatory Visit: Payer: Self-pay | Admitting: Neurology

## 2019-04-29 DIAGNOSIS — G40219 Localization-related (focal) (partial) symptomatic epilepsy and epileptic syndromes with complex partial seizures, intractable, without status epilepticus: Secondary | ICD-10-CM

## 2019-07-31 ENCOUNTER — Ambulatory Visit: Payer: Medicaid Other | Attending: Internal Medicine

## 2019-07-31 DIAGNOSIS — Z23 Encounter for immunization: Secondary | ICD-10-CM

## 2019-07-31 NOTE — Progress Notes (Signed)
   Covid-19 Vaccination Clinic  Name:  Amanda Lozano    MRN: [id number] DOB: 1971-02-08  07/31/2019  [name] was observed post Covid-19 immunization for 15 minutes without incident. She was provided with Vaccine Information Sheet and instruction to access the V-Safe system.   [name] was instructed to call 911 with any severe reactions post vaccine: Marland Kitchen Difficulty breathing  . Swelling of face and throat  . A fast heartbeat  . A bad rash all over body  . Dizziness and weakness   Immunizations Administered    Name Date Dose VIS Date Route   Pfizer COVID-19 Vaccine 07/31/2019  2:53 PM 0.3 mL 04/17/2019 Intramuscular   Manufacturer: [employer], [employer]   Lot: [id number]   NDC: [address]-1

## 2019-08-25 ENCOUNTER — Ambulatory Visit: Payer: Medicaid Other | Attending: Internal Medicine

## 2019-08-25 DIAGNOSIS — Z23 Encounter for immunization: Secondary | ICD-10-CM

## 2019-08-25 NOTE — Progress Notes (Signed)
   Covid-19 Vaccination Clinic  Name:  Dhamar Gregory    MRN: [id number] DOB: 1971/04/27  08/25/2019  [name] was observed post Covid-19 immunization for 15 minutes without incident. She was provided with Vaccine Information Sheet and instruction to access the V-Safe system.   [name] was instructed to call 911 with any severe reactions post vaccine: Marland Kitchen Difficulty breathing  . Swelling of face and throat  . A fast heartbeat  . A bad rash all over body  . Dizziness and weakness   Immunizations Administered    Name Date Dose VIS Date Route   Pfizer COVID-19 Vaccine 08/25/2019  2:53 PM 0.3 mL 07/01/2018 Intramuscular   Manufacturer: [employer], [employer]   Lot: [id number]   NDC: [address]-1

## 2019-10-15 ENCOUNTER — Other Ambulatory Visit: Payer: Self-pay

## 2019-10-15 ENCOUNTER — Other Ambulatory Visit: Payer: Self-pay | Admitting: Neurology

## 2019-10-15 DIAGNOSIS — G40219 Localization-related (focal) (partial) symptomatic epilepsy and epileptic syndromes with complex partial seizures, intractable, without status epilepticus: Secondary | ICD-10-CM

## 2019-10-15 MED ORDER — CARBAMAZEPINE 100 MG PO CHEW: CHEWABLE_TABLET | ORAL | 1 refills | Status: DC

## 2019-10-15 NOTE — Telephone Encounter (Signed)
Patient is requesting a refill of carbamazepine to be [facility] [address].

## 2019-10-15 NOTE — Telephone Encounter (Signed)
Medication refilled

## 2019-10-16 ENCOUNTER — Other Ambulatory Visit: Payer: Self-pay

## 2020-04-11 NOTE — Progress Notes (Signed)
Virtual Visit via Video Note The purpose of this virtual visit is to provide medical care while limiting exposure to the novel coronavirus.    Consent was obtained for video visit:  Yes.   Answered questions that patient had about telehealth interaction:  Yes.   I discussed the limitations, risks, security and privacy concerns of performing an evaluation and management service by telemedicine. I also discussed with the patient that there may be a patient responsible charge related to this service. The patient expressed understanding and agreed to proceed.  Pt location: Home Physician Location: office Name of referring provider:  [name], MD I connected with Tami Ribas at patients initiation/request on 04/12/2020 at  8:50 AM EST by video enabled telemedicine application and verified that I am speaking with the correct person using two identifiers. Pt MRN:  [id number] Pt DOB:  1970/07/21 Video Participants:  Tami Ribas   History of Present Illness:  Amanda Lozano is a 49 year old woman with hypertension and depression who follows up for seizure disorder.  UPDATE: She takes carbamazepine 300 mg twice daily. She is feeling well.  Depression is controlled.  She has not had any recurrent seizures.  HISTORY: Patient has a history of seizure disorder, diagnosed in 66.However, she has experienced multiple brief "staring spells" since childhood, looking like she was daydreaming. It would only last a few seconds. In 1997, 3 months after having her second child, she had her first generalized tonic-clonic seizure. At first, it was thought to be a reaction to the Depo shot.She denies any aura but will sometimes no fatigue. Usually they occurred at night time out of sleep. Often, she would wake up feeling fatigued. She has had some witnessed seizures and was noted to have generalized shaking with eyes rolled back. There would be a tongue laceration but no incontinence.She  was initially followed for several years by a neurologist, Dr. Micheline Maze, in [place].Reportedly, MRIs of the brain were unremarkable. She also said she had several EEGs which were unremarkable.  She had been on carbamazepine 100mg  in morning and 200mg  in evening for many years.High doses caused increased lethargy.She was seizure free from generalized tonic-clonic seizures for about 7 years up until May 2014, presumably due to having missed some doses of her medication.She will have episodes about every 6 months or so.She has disturbed sleep with vivid dreams.She wakes up with the same symptoms of headache, depression and sleepiness, suggesting another seizure.This would last a couple of days.Sometimes, she has bitten her tongue or lost bladder control.It should be noted that she still occasionally has "silent" seizures, namely the staring spells. She does not drive.24 hour ambulatory EEG was performed 12/03/13 to 12/04/13, which captured several staring spells and was a normal awake and asleep study.Stress and heavy menses are triggers.  She does have a family history of seizures on her father's side, namely her grandmother and her cousin. She had a normal birth. She has no history of meningitis or significant head trauma.   Past AEDs:Dilantin (elevated LFTs), Topamax (fogginess), Lamictal (could not function)  MRI of brain without contrast from 12/17/16 was normal.  Past Medical History: Past Medical History:  Diagnosis Date  . High cholesterol   . History of carpal tunnel syndrome   . Hypertension   . Seizures (HCC)    Last seizure "years ago"    Medications: Outpatient Encounter Medications as of 04/12/2020  Medication Sig  . amLODipine (NORVASC) 5 MG tablet Take 1 tablet (5 mg total) by mouth  daily.  . aspirin EC 81 MG tablet Take 81 mg by mouth every 4 (four) hours as needed (HA).  Marland Kitchen atenolol (TENORMIN) 50 MG tablet Take 3 tablets (150 mg total) by mouth  daily.  . carbamazepine (TEGRETOL) 100 MG chewable tablet CHEW AND SWALLOW 3 TABLETS BY MOUTH TWICE DAILY  . carbamazepine (TEGRETOL) 100 MG chewable tablet CHEW 3 TABLETS BY MOUTH 2 TIMES DAILY.  . ferrous sulfate 325 (65 FE) MG tablet Take 325 mg by mouth daily with breakfast.   No facility-administered encounter medications on file as of 04/12/2020.    Allergies: No Known Allergies  Family History: Family History  Problem Relation Age of Onset  . Hypertension Father   . Seizures Paternal Grandmother     Social History: Social History   Socioeconomic History  . Marital status: Divorced    Spouse name: Not on file  . Number of children: 4  . Years of education: Not on file  . Highest education level: Associate degree: occupational, Scientist, product/process development, or vocational program  Occupational History  . Not on file  Tobacco Use  . Smoking status: Never Smoker  . Smokeless tobacco: Never Used  Vaping Use  . Vaping Use: Never used  Substance and Sexual Activity  . Alcohol use: No  . Drug use: No  . Sexual activity: Not Currently    Partners: Male    Birth control/protection: Abstinence  Other Topics Concern  . Not on file  Social History Narrative   Divorce, right handed   4 children   Drinks coffee sometimes, tea sometimes, soda sometimes.   Social Determinants of Health   Financial Resource Strain:   . Difficulty of Paying Living Expenses: Not on file  Food Insecurity:   . Worried About Programme researcher, broadcasting/film/video in the Last Year: Not on file  . Ran Out of Food in the Last Year: Not on file  Transportation Needs:   . Lack of Transportation (Medical): Not on file  . Lack of Transportation (Non-Medical): Not on file  Physical Activity:   . Days of Exercise per Week: Not on file  . Minutes of Exercise per Session: Not on file  Stress:   . Feeling of Stress : Not on file  Social Connections:   . Frequency of Communication with Friends and Family: Not on file  . Frequency of  Social Gatherings with Friends and Family: Not on file  . Attends Religious Services: Not on file  . Active Member of Clubs or Organizations: Not on file  . Attends Banker Meetings: Not on file  . Marital Status: Not on file  Intimate Partner Violence:   . Fear of Current or Ex-Partner: Not on file  . Emotionally Abused: Not on file  . Physically Abused: Not on file  . Sexually Abused: Not on file    Observations/Objective:   No acute distress.  Alert and oriented.  Speech fluent and not dysarthric.  Language intact.   Assessment and Plan:   Seizure disorder, stable  1.  Carbamazepine 300mg  twice daily.  She will have her PCP send recent routine labs (CBC, CMP) 2.  Follow up one year  Follow Up Instructions:    -I discussed the assessment and treatment plan with the patient. The patient was provided an opportunity to ask questions and all were answered. The patient agreed with the plan and demonstrated an understanding of the instructions.   The patient was advised to call back or seek an in-person evaluation  if the symptoms worsen or if the condition fails to improve as anticipated.   Dudley Major, DO

## 2020-04-12 ENCOUNTER — Other Ambulatory Visit: Payer: Self-pay

## 2020-04-12 ENCOUNTER — Encounter: Payer: Self-pay | Admitting: Neurology

## 2020-04-12 ENCOUNTER — Telehealth (INDEPENDENT_AMBULATORY_CARE_PROVIDER_SITE_OTHER): Payer: Medicaid Other | Admitting: Neurology

## 2020-04-12 DIAGNOSIS — G40219 Localization-related (focal) (partial) symptomatic epilepsy and epileptic syndromes with complex partial seizures, intractable, without status epilepticus: Secondary | ICD-10-CM

## 2020-04-12 MED ORDER — CARBAMAZEPINE 100 MG PO CHEW: CHEWABLE_TABLET | ORAL | 1 refills | Status: DC

## 2020-04-12 NOTE — Patient Instructions (Signed)
1.  Carbamazepine 300mg  twice daily 2.  Follow up one year.

## 2020-04-13 ENCOUNTER — Other Ambulatory Visit: Payer: Self-pay | Admitting: Neurology

## 2020-04-13 ENCOUNTER — Telehealth: Payer: Self-pay | Admitting: Neurology

## 2020-04-13 DIAGNOSIS — G40219 Localization-related (focal) (partial) symptomatic epilepsy and epileptic syndromes with complex partial seizures, intractable, without status epilepticus: Secondary | ICD-10-CM

## 2020-04-13 NOTE — Telephone Encounter (Signed)
Patient spoke with pharmacy, it was an error on the pharmacy's end. They will have her medication ready, patient states she doesn't need a call back now. Thank you!

## 2020-04-13 NOTE — Telephone Encounter (Signed)
Pt states that she will be out of Carbamazepine on 04-20-20 and [facility] is [facility] is to [facility] to [facility] the [facility]. She is not sure what is going on. She states that she usually gets a 3 month supply. She is worried about running out and having a seizure please call patient

## 2020-10-10 ENCOUNTER — Telehealth: Payer: Self-pay | Admitting: Neurology

## 2020-10-10 ENCOUNTER — Other Ambulatory Visit: Payer: Self-pay

## 2020-10-10 DIAGNOSIS — G40219 Localization-related (focal) (partial) symptomatic epilepsy and epileptic syndromes with complex partial seizures, intractable, without status epilepticus: Secondary | ICD-10-CM

## 2020-10-10 MED ORDER — CARBAMAZEPINE 100 MG PO CHEW: CHEWABLE_TABLET | ORAL | 1 refills | Status: DC

## 2020-10-10 NOTE — Telephone Encounter (Signed)
Patient called in to get a refill of her carbamazepine.

## 2020-10-11 ENCOUNTER — Other Ambulatory Visit: Payer: Self-pay | Admitting: Neurology

## 2020-10-11 DIAGNOSIS — G40219 Localization-related (focal) (partial) symptomatic epilepsy and epileptic syndromes with complex partial seizures, intractable, without status epilepticus: Secondary | ICD-10-CM

## 2021-01-18 ENCOUNTER — Telehealth: Payer: Self-pay | Admitting: Neurology

## 2021-01-18 DIAGNOSIS — G40219 Localization-related (focal) (partial) symptomatic epilepsy and epileptic syndromes with complex partial seizures, intractable, without status epilepticus: Secondary | ICD-10-CM

## 2021-01-18 MED ORDER — CARBAMAZEPINE 100 MG PO CHEW: CHEWABLE_TABLET | ORAL | 0 refills | Status: DC

## 2021-01-18 NOTE — Telephone Encounter (Signed)
Pt called in, she needs a refill on her carbamazepine 100mg . She had one pil for tonight left. She has a follow up appt in December.

## 2021-01-18 NOTE — Telephone Encounter (Signed)
Refill sent.

## 2021-01-25 ENCOUNTER — Emergency Department (HOSPITAL_COMMUNITY)
Admission: EM | Admit: 2021-01-25 | Discharge: 2021-01-25 | Disposition: A | Discharge status: Home / Self Care | Payer: Medicaid Other | Attending: Emergency Medicine | Admitting: Emergency Medicine

## 2021-01-25 ENCOUNTER — Encounter (HOSPITAL_COMMUNITY): Payer: Self-pay | Admitting: *Deleted

## 2021-01-25 ENCOUNTER — Other Ambulatory Visit: Payer: Self-pay

## 2021-01-25 DIAGNOSIS — Z79899 Other long term (current) drug therapy: Secondary | ICD-10-CM | POA: Diagnosis not present

## 2021-01-25 DIAGNOSIS — S61012A Laceration without foreign body of left thumb without damage to nail, initial encounter: Secondary | ICD-10-CM | POA: Insufficient documentation

## 2021-01-25 DIAGNOSIS — I10 Essential (primary) hypertension: Secondary | ICD-10-CM | POA: Insufficient documentation

## 2021-01-25 DIAGNOSIS — S6992XA Unspecified injury of left wrist, hand and finger(s), initial encounter: Secondary | ICD-10-CM | POA: Diagnosis present

## 2021-01-25 DIAGNOSIS — Z23 Encounter for immunization: Secondary | ICD-10-CM | POA: Insufficient documentation

## 2021-01-25 DIAGNOSIS — Y92090 Kitchen in other non-institutional residence as the place of occurrence of the external cause: Secondary | ICD-10-CM | POA: Diagnosis not present

## 2021-01-25 DIAGNOSIS — W260XXA Contact with knife, initial encounter: Secondary | ICD-10-CM | POA: Insufficient documentation

## 2021-01-25 MED ORDER — LIDOCAINE HCL (PF) 1 % IJ SOLN
10.0000 mL | Freq: Once | INTRAMUSCULAR | Status: AC
  Administered 2021-01-25: 10 mL
  Filled 2021-01-25: qty 10

## 2021-01-25 MED ORDER — TETANUS-DIPHTH-ACELL PERTUSSIS 5-2.5-18.5 LF-MCG/0.5 IM SUSY
0.5000 mL | PREFILLED_SYRINGE | Freq: Once | INTRAMUSCULAR | Status: AC
  Administered 2021-01-25: 0.5 mL via INTRAMUSCULAR
  Filled 2021-01-25: qty 0.5

## 2021-01-25 NOTE — ED Provider Notes (Signed)
[facility] EMERGENCY DEPARTMENT Provider Note   CSN: [id number] Arrival date & time: 01/25/21  1811     History Chief Complaint  Patient presents with  . Extremity Laceration    Amanda Lozano is a 50 y.o. female.  HPI   Patient presents with acute laceration to the left thumb.  This happened while she was cutting vegetables at home with a knife.  There is some associated bleeding but it was well controlled with pressure.  She not having any associated numbness or tingling.  The bleeding has improved with pressure, the pain is 2 out of 10.  It is a throbbing pain.  Worse with movement.  Does not involve the nailbed, does not involve the joint.  Unknown last tetanus.  Patient is not diabetic.  Past Medical History:  Diagnosis Date  . High cholesterol   . History of carpal tunnel syndrome   . Hypertension   . Seizures (HCC)    Last seizure "years ago"    Patient Active Problem List   Diagnosis Date Noted  . S/P thoracentesis   . HCAP (healthcare-associated pneumonia) 02/06/2017  . Benign essential HTN 12/17/2016  . Sepsis (HCC) 12/17/2016  . LLL pneumonia 12/16/2016  . Pleural effusion 12/16/2016  . 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 Depression 11/17/2013  . Staring spell 08/04/2013  . Convulsions/seizures (HCC) 11/10/2012  . Elderly multigravida with antepartum condition or complication 03/15/2011    Past Surgical History:  Procedure Laterality Date  . CESAREAN SECTION  09/11/2011   Procedure: CESAREAN SECTION;  Surgeon: Brock Bad, MD;  Location: [facility] ORS;  Service: Gynecology;  Laterality: N/A;  . IR THORACENTESIS ASP PLEURAL SPACE W/IMG GUIDE  12/18/2016     OB History     Gravida  6   Para  4   Term  4   Preterm  0   AB  1   Living  4      SAB  1   IAB  0   Ectopic  0   Multiple  0   Live Births  4        Obstetric Comments  Previous CS for  breech          Family History  Problem Relation Age of Onset  . Hypertension Father   . Seizures Paternal Grandmother     Social History   Tobacco Use  . Smoking status: Never  . Smokeless tobacco: Never  Vaping Use  . Vaping Use: Never used  Substance Use Topics  . Alcohol use: No  . Drug use: No    Home Medications Prior to Admission medications   Medication Sig Start Date End Date Taking? Authorizing Provider  amLODipine (NORVASC) 5 MG tablet Take 1 tablet (5 mg total) by mouth daily. 11/18/16   Katrinka Blazing, [place], CNM  aspirin EC 81 MG tablet Take 81 mg by mouth every 4 (four) hours as needed (HA).    [provider]  atenolol (TENORMIN) 50 MG tablet Take 3 tablets (150 mg total) by mouth daily. 02/10/17   Vassie Loll, MD  carbamazepine (TEGRETOL) 100 MG chewable tablet Chew 3 tablets twice daily 01/18/21   Drema Dallas, DO  ferrous sulfate 325 (65 FE) MG tablet Take 325 mg by mouth daily with breakfast.    [provider]    Allergies    Patient has no known allergies.  Review of Systems   Review of Systems  Constitutional:  Negative for fever.  Skin:  Positive for wound. Negative for color change.   Physical Exam Updated Vital Signs BP (!) 187/108 (BP Location: Right Arm)   Pulse 76   Temp 98.6 F (37 C)   Resp 16   Ht 5\' 2"  (1.575 m)   Wt 81.6 kg   LMP 01/01/2021 (Approximate)   SpO2 100%   BMI 32.92 kg/m   Physical Exam Vitals and nursing note reviewed. Exam conducted with a chaperone present.  Constitutional:      General: She is not in acute distress.    Appearance: Normal appearance.  HENT:     Head: Normocephalic and atraumatic.  Eyes:     General: No scleral icterus.    Extraocular Movements: Extraocular movements intact.     Pupils: Pupils are equal, round, and reactive to light.  Musculoskeletal:        General: Swelling, tenderness and signs of injury present. Normal range of motion.     Comments: Patient is full  range of motion to the left thumb.  Skin:    Coloration: Skin is not jaundiced.     Comments: Laceration to left thumb, no nailbed involvement, does not involve the joint  Neurological:     Mental Status: She is alert. Mental status is at baseline.     Coordination: Coordination normal.   ED Results / Procedures / Treatments   Labs (all labs ordered are listed, but only abnormal results are displayed) Labs Reviewed - No data to display  EKG None  Radiology No results found.  Procedures .08/30/2022Laceration Repair  Date/Time: 01/25/2021 11:07 PM Performed by: 01/27/2021, PA-C Authorized by: Theron Arista, PA-C   Consent:    Consent obtained:  Verbal   Consent given by:  Patient   Risks, benefits, and alternatives were discussed: yes     Risks discussed:  Infection, pain, poor wound healing and poor cosmetic result Universal protocol:    Patient identity confirmed:  Verbally with patient Anesthesia:    Anesthesia method:  Local infiltration   Local anesthetic:  Lidocaine 1% w/o epi Laceration details:    Location:  Finger   Finger location:  L thumb   Length (cm):  3   Depth (mm):  2 Pre-procedure details:    Preparation:  Patient was prepped and draped in usual sterile fashion Exploration:    Hemostasis achieved with:  Direct pressure   Contaminated: no   Treatment:    Area cleansed with:  Chlorhexidine Skin repair:    Repair method:  Sutures   Suture size:  4-0   Suture material:  Prolene   Suture technique:  Simple interrupted Approximation:    Approximation:  Close Repair type:    Repair type:  Simple Post-procedure details:    Dressing:  Antibiotic ointment and non-adherent dressing   Procedure completion:  Tolerated well, no immediate complications   Medications Ordered in ED Medications  Tdap (BOOSTRIX) injection 0.5 mL (0.5 mLs Intramuscular Given 01/25/21 2135)  lidocaine (PF) (XYLOCAINE) 1 % injection 10 mL (10 mLs Infiltration Given 01/25/21 2135)    ED  Course  I have reviewed the triage vital signs and the nursing notes.  Pertinent labs & imaging results that were available during my care of the patient were reviewed by me and considered in my medical decision making (see chart for details).    MDM Rules/Calculators/A&P  Patient vitals are stable, she is neuro vastly intact.  No nail bed involvement.  Tetanus was updated.  Patient handled the procedure well without any complications.  Still neurovascular tact after the procedure.  Cap refill is less than 2.  Final Clinical Impression(s) / ED Diagnoses Final diagnoses:  None    Rx / DC Orders ED Discharge Orders     None        Theron Arista, Cordelia Poche 01/25/21 2313    Eber Hong, MD 01/29/21 [phone]

## 2021-01-25 NOTE — ED Notes (Signed)
Pt states she needs to take BP meds when she gets home.

## 2021-01-25 NOTE — Discharge Instructions (Signed)
Have the sutures removed in the next 10 to 14 days by your primary care doctor.   Wash the laceration with soap and water.  If you have any signs of infection please return.  You  can take Tylenol and Motrin as needed for the pain.  Use the bacitracin ointment whenever you apply new dressings or bandaid.

## 2021-01-25 NOTE — ED Triage Notes (Signed)
PT cut L thumb with kitchen knife.  1 inch lac.  Bleeding controlled.

## 2021-01-25 NOTE — ED Provider Notes (Signed)
Emergency Medicine Provider Triage Evaluation Note  Amanda Lozano , a 50 y.o. female  was evaluated in triage.  Pt complains of laceration to left thumb.  Just prior to arrival in emergency department patient was cooking when she excellently cut her left thumb with a knife.  Patient reports numbness laceration site.  Patient is unsure when her last tetanus shot was.  Patient is right-hand dominant  Review of Systems  Positive: Laceration, numbness Negative: Weakness, pain  Physical Exam  There were no vitals taken for this visit. Gen:   Awake, no distress   Resp:  Normal effort  MSK:   Moves extremities without difficulty; laceration to left thumb no nail involvement.  Patient has full range of motion to affected digit.  No tenderness to palpation to affected digit. Other:  +2 left radial pulse  Medical Decision Making  Medically screening exam initiated at 6:32 PM.  Appropriate orders placed.  Amanda Lozano was informed that the remainder of the evaluation will be completed by another provider, this initial triage assessment does not replace that evaluation, and the importance of remaining in the ED until their evaluation is complete.  The patient appears stable so that the remainder of the work up may be completed by another provider.      Amanda Schroeder, PA-C 01/25/21 Amanda Lozano    Amanda Hong, MD 01/29/21 [phone]

## 2021-02-08 ENCOUNTER — Ambulatory Visit (HOSPITAL_COMMUNITY)
Admission: EM | Admit: 2021-02-08 | Discharge: 2021-02-08 | Disposition: A | Discharge status: Home / Self Care | Payer: Medicaid Other | Attending: Medical Oncology | Admitting: Medical Oncology

## 2021-02-08 ENCOUNTER — Other Ambulatory Visit: Payer: Self-pay

## 2021-02-08 ENCOUNTER — Encounter (HOSPITAL_COMMUNITY): Payer: Self-pay

## 2021-02-08 DIAGNOSIS — J029 Acute pharyngitis, unspecified: Secondary | ICD-10-CM | POA: Insufficient documentation

## 2021-02-08 DIAGNOSIS — J069 Acute upper respiratory infection, unspecified: Secondary | ICD-10-CM | POA: Diagnosis present

## 2021-02-08 LAB — POCT RAPID STREP A, ED / UC: Streptococcus, Group A Screen (Direct): NEGATIVE

## 2021-02-08 MED ORDER — BENZONATATE 100 MG PO CAPS: 100.0000 mg | ORAL_CAPSULE | Freq: Three times a day (TID) | ORAL | 0 refills | Status: DC

## 2021-02-08 MED ORDER — FLUTICASONE PROPIONATE 50 MCG/ACT NA SUSP: 2.0000 | Freq: Every day | NASAL | 0 refills | Status: DC

## 2021-02-08 NOTE — ED Triage Notes (Signed)
Pt presents with productive cough and sore throat X 4 days. 

## 2021-02-08 NOTE — ED Provider Notes (Signed)
[facility]    CSN: [id number] Arrival date & time: 02/08/21  4098      History   Chief Complaint Chief Complaint  Patient presents with   Cough   Sore Throat    HPI Amanda Lozano is a 50 y.o. female.   HPI  Sore throat: Pt presents with a sore throat for the last 4 days along with a cough. Cough is occasionally productive with yellow sputum but never bloody. Daughters have similar symptoms and she reports a negative COVID-19 test. She has tried OTC medications for cough and congestion with some improvement. She denies known fever, SOB, chest pain. She also presents for suture removal from a finger laceration that occurred 14 days ago. Healing well without bleeding, discharge or edema.   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) 08/03/2014   Depression 11/17/2013   Staring spell 08/04/2013   Convulsions/seizures (HCC) 11/10/2012   Elderly multigravida with antepartum condition or complication 03/15/2011    Past Surgical History:  Procedure Laterality Date   CESAREAN SECTION  09/11/2011   Procedure: CESAREAN SECTION;  Surgeon: Brock Bad, MD;  Location: [facility];  Service: Gynecology;  Laterality: N/A;   IR THORACENTESIS ASP PLEURAL SPACE W/IMG GUIDE  12/18/2016    OB History     Gravida  6   Para  4   Term  4   Preterm  0   AB  1   Living  4      SAB  1   IAB  0   Ectopic  0   Multiple  0   Live Births  4        Obstetric Comments  Previous CS for breech           Home  Medications    Prior to Admission medications   Medication Sig Start Date End Date Taking? Authorizing Provider  benzonatate (TESSALON) 100 MG capsule Take 1 capsule (100 mg total) by mouth every 8 (eight) hours. 02/08/21  Yes Blessin Kanno M, PA-C  fluticasone [facility]) 50 MCG/ACT nasal spray Place 2 sprays into both nostrils daily. 02/08/21  Yes Jaeli Grubb, Maralyn Sago M, PA-C  amLODipine (NORVASC) 5 MG tablet Take 1 tablet (5 mg total) by mouth daily. 11/18/16   Katrinka Blazing, [place], CNM  aspirin EC 81 MG tablet Take 81 mg by mouth every 4 (four) hours as needed (HA).    [provider]  atenolol (TENORMIN) 50 MG tablet Take 3 tablets (150 mg total) by mouth daily. 02/10/17   Vassie Loll, MD  carbamazepine (TEGRETOL) 100 MG chewable tablet Chew 3 tablets twice daily 01/18/21   Drema Dallas, DO  ferrous sulfate 325 (65 FE) MG tablet Take 325 mg by mouth daily with breakfast.    [provider]    Family History Family History  Problem Relation Age of Onset   Hypertension Father    Seizures Paternal Grandmother     Social History Social History   Tobacco Use   Smoking status:  Never   Smokeless tobacco: Never  Vaping Use   Vaping Use: Never used  Substance Use Topics   Alcohol use: No   Drug use: No     Allergies   Patient has no known allergies.   Review of Systems Review of Systems  As stated above in HPI Physical Exam Triage Vital Signs ED Triage Vitals  Enc Vitals Group     BP 02/08/21 0838 (!) 171/98     Pulse Rate 02/08/21 0838 85     Resp 02/08/21 0838 17     Temp 02/08/21 0838 99 F (37.2 C)     Temp Source 02/08/21 0838 Oral     SpO2 02/08/21 0838 98 %     Weight --      Height --      Head Circumference --      Peak Flow --      Pain Score 02/08/21 0842 6     Pain Loc --      Pain Edu? --      Excl. in GC? --    No data found.  Updated Vital Signs BP (!) 171/98 (BP Location: Left Arm)   Pulse 85   Temp 99 F (37.2 C) (Oral)   Resp  17   LMP 01/29/2021   SpO2 98%   Physical Exam Vitals and nursing note reviewed.  Constitutional:      General: She is not in acute distress.    Appearance: She is well-developed. She is not ill-appearing, toxic-appearing or diaphoretic.  HENT:     Head: Normocephalic and atraumatic.     Right Ear: Tympanic membrane normal. No middle ear effusion. Tympanic membrane is not erythematous.     Left Ear: Tympanic membrane normal.  No middle ear effusion. Tympanic membrane is not erythematous.     Nose: No congestion or rhinorrhea.     Mouth/Throat:     Mouth: Mucous membranes are moist.     Pharynx: Oropharynx is clear. Uvula midline. Posterior oropharyngeal erythema (some mild petichia) present. No oropharyngeal exudate.     Tonsils: No tonsillar exudate.  Eyes:     Conjunctiva/sclera: Conjunctivae normal.     Pupils: Pupils are equal, round, and reactive to light.  Cardiovascular:     Rate and Rhythm: Normal rate and regular rhythm.     Heart sounds: Normal heart sounds.  Pulmonary:     Effort: Pulmonary effort is normal.     Breath sounds: Normal breath sounds.  Musculoskeletal:     Cervical back: Normal range of motion and neck supple.  Lymphadenopathy:     Cervical: No cervical adenopathy.  Skin:    General: Skin is warm.     Comments: Laceration appears well healed without erythema, edema or discharge  Neurological:     Mental Status: She is alert and oriented to person, place, and time.     UC Treatments / Results  Labs (all labs ordered are listed, but only abnormal results are displayed) Labs Reviewed  CULTURE, GROUP A STREP [facility])  POCT RAPID STREP A, ED / UC    EKG   Radiology No results found.  Procedures Procedures (including critical care time)  Medications Ordered in UC Medications - No data to display  Initial Impression / Assessment and Plan / UC Course  I have reviewed the triage vital signs and the nursing notes.  Pertinent labs & imaging  results that were available during my care of the patient were reviewed by me  and considered in my medical decision making (see chart for details).     New. Likely viral in nature but given her sore throat, 99 F temp and petechia we will run a strep test. Sending in tessalon and flonase to help as well. Suture removal and discussion of wound care. Discussed red flag signs and symptoms.    Final Clinical Impressions(s) / UC Diagnoses   Final diagnoses:  Sore throat  Viral URI with cough   Discharge Instructions   None    ED Prescriptions     Medication Sig Dispense Auth. Provider   benzonatate (TESSALON) 100 MG capsule Take 1 capsule (100 mg total) by mouth every 8 (eight) hours. 21 capsule Draiden Mirsky M, PA-C   fluticasone [facility]) 50 MCG/ACT nasal spray Place 2 sprays into both nostrils daily. 16 mL Rushie Chestnut, [place]      PDMP not reviewed this encounter.   Rushie Chestnut, [place] 02/08/21 [phone]

## 2021-02-08 NOTE — ED Notes (Signed)
Pt had 6 sutures removed from left thumb

## 2021-02-11 LAB — CULTURE, GROUP A STREP (THRC)

## 2021-04-07 ENCOUNTER — Telehealth: Payer: Self-pay | Admitting: Neurology

## 2021-04-07 DIAGNOSIS — G40219 Localization-related (focal) (partial) symptomatic epilepsy and epileptic syndromes with complex partial seizures, intractable, without status epilepticus: Secondary | ICD-10-CM

## 2021-04-07 DIAGNOSIS — Z79899 Other long term (current) drug therapy: Secondary | ICD-10-CM

## 2021-04-07 NOTE — Telephone Encounter (Signed)
Pt advised of Dr.Jaffe note, If there is an opening, then she can take it.  She has an upcoming appointment already.  At this time, I would like to check CBC, CMP and carbamazepine level.  She cannot drive.     Pt will come and do labs Monday and wait til her appt. To see jaffe so he can go over the results of the labs.   Labs added.

## 2021-04-07 NOTE — Telephone Encounter (Signed)
Pt said she had a bad seizure last night. Paramedics were called, blood pressure was high. Did not go to ER. She is very tired. Head and whole hurts, feels like shes been hit by a bus. Would like to see be seen asap if can. Has an appt 12/12

## 2021-04-07 NOTE — Telephone Encounter (Signed)
Pt c/o: seizure Missed medications?  No. Sleep deprived?  No. Alcohol intake?  No. Back to their usual baseline self?  No.. If no, advise go to ER Current medications prescribed by Dr. Everlena Cooper: Carbamazepine.  Pt c/o body aches after the seizure. Pt states her husband found her in the floor of her kitchen. Pt states it started with a sharp head pain. Pt states she told her husband felt weird. Tried to get some where to sit and she went down. Pt staying at her daughter house right now. Pt states she tired and been sleeping all day.    Pt wanted to know if she could see Dr.Jaffe on Monday. She did state she had more stress with her job lately.

## 2021-04-10 ENCOUNTER — Other Ambulatory Visit: Payer: Self-pay

## 2021-04-10 ENCOUNTER — Other Ambulatory Visit (INDEPENDENT_AMBULATORY_CARE_PROVIDER_SITE_OTHER): Payer: Medicaid Other

## 2021-04-10 DIAGNOSIS — Z79899 Other long term (current) drug therapy: Secondary | ICD-10-CM

## 2021-04-10 DIAGNOSIS — G40219 Localization-related (focal) (partial) symptomatic epilepsy and epileptic syndromes with complex partial seizures, intractable, without status epilepticus: Secondary | ICD-10-CM

## 2021-04-10 LAB — COMPREHENSIVE METABOLIC PANEL
ALT: 15 U/L (ref 0–35)
AST: 24 U/L (ref 0–37)
Albumin: 4.2 g/dL (ref 3.5–5.2)
Alkaline Phosphatase: 151 U/L — ABNORMAL HIGH (ref 39–117)
BUN: 11 mg/dL (ref 6–23)
CO2: 28 mEq/L (ref 19–32)
Calcium: 9.5 mg/dL (ref 8.4–10.5)
Chloride: 99 mEq/L (ref 96–112)
Creatinine, Ser: 0.62 mg/dL (ref 0.40–1.20)
GFR: 105.02 mL/min (ref >60.00)
Glucose, Bld: 85 mg/dL (ref 70–99)
Potassium: 4.2 mEq/L (ref 3.5–5.1)
Sodium: 135 mEq/L (ref 135–145)
Total Bilirubin: 0.3 mg/dL (ref 0.2–1.2)
Total Protein: 9.3 g/dL — ABNORMAL HIGH (ref 6.0–8.3)

## 2021-04-10 LAB — CBC
HCT: 39.8 % (ref 36.0–46.0)
Hemoglobin: 12.6 g/dL (ref 12.0–15.0)
MCHC: 31.7 g/dL (ref 30.0–36.0)
MCV: 88.2 fl (ref 78.0–100.0)
Platelets: 208 10*3/uL (ref 150.0–400.0)
RBC: 4.51 Mil/uL (ref 3.87–5.11)
RDW: 14.3 % (ref 11.5–15.5)
WBC: 3.5 10*3/uL — ABNORMAL LOW (ref 4.0–10.5)

## 2021-04-11 LAB — CARBAMAZEPINE LEVEL, TOTAL: Carbamazepine Lvl: 9.4 mg/L (ref 4.0–12.0)

## 2021-04-14 NOTE — Progress Notes (Signed)
NEUROLOGY FOLLOW UP OFFICE NOTE  Amanda Lozano [id number]  Assessment/Plan:   Seizure disorder - breakthrough seizure.  Possibly triggered by stress.  Due to elevated alk phos, would not increase carbamazepine.  Start zonisamide 139m daily.  Continue carbamazepine 3033mtwice daily for now.   Repeat CBC and CMP in 4 months.  She will follow up about a week later Discussed [place] law stating no driving for 6 months after a seizure Patient may return to work.    Subjective:  [name] a [age]ear old woman with hypertension and depression who follows up for seizure disorder.   UPDATE: Current medication:  carbamazepine 30053mwice daily  On 04/06/2021, she felt strange that morning.  She later was in her kitchen when she collapsed and convulsed for a couple of minutes.  She was unconscious.  She was unresponsive for a few minutes.  When she finally opened her eyes, she was confused and had trouble answering questions.  She may have bit the side of her tongue but denied incontinence.  She denies missing dose of her carbamazepine.  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  Labs from 04/10/2021 revealed carbamazepine level 9.4, CBC with low WBC 3.5, normal electrolytes, elevated alk phos 151 but normal t bili, AST and ALT.    HISTORY: Patient has a history of seizure disorder, diagnosed in 199[address]However, she has experienced multiple brief "staring spells" since childhood, looking like she was daydreaming. It would only last a few seconds. In 1997, 3 months after having her second child, she had her first generalized tonic-clonic seizure. At first, it was thought to be a reaction to the Depo shot.  She denies any aura but will sometimes no fatigue. Usually they occurred at night time out of sleep. Often, she would wake up feeling fatigued. She has had some witnessed seizures and  was noted to have generalized shaking with eyes rolled back. There would be a tongue laceration but no incontinence.  She was initially followed for several years by a neurologist, Dr. BoyDerrel Nipn HamLaskerReportedly, MRIs of the brain were unremarkable. She also said she had several EEGs which were unremarkable.     She had been on carbamazepine 100m9m morning and 200mg15mevening for many years.  High doses caused increased lethargy.  She was seizure free from generalized tonic-clonic seizures for about 7 years up until May 2014, presumably due to having missed some doses of her medication.  She will have episodes about every 6 months or so.  She has disturbed sleep with vivid dreams.  She wakes up with the same symptoms of headache, depression and sleepiness, suggesting another seizure.  This would last a couple of days.  Sometimes, she has bitten her tongue or lost bladder control.  It should be noted that she still occasionally has "silent" seizures, namely the staring spells. She does not drive.  24 hour ambulatory EEG was performed 12/03/13 to 12/04/13, which captured several staring spells and was a normal awake and asleep study.  Stress and heavy menses are triggers.   She does have a family history of seizures on her father's side, namely her grandmother and her cousin. She had a normal birth. She has no history of meningitis or significant head trauma.    Past AEDs:  Dilantin (elevated LFTs), Topamax (fogginess), Lamictal (could not function)  MRI of brain without contrast from 12/17/16 was normal.   PAST MEDICAL HISTORY: Past Medical History:  Diagnosis Date   High cholesterol    History of carpal tunnel syndrome    Hypertension    Seizures ([place])    Last seizure "years ago"    MEDICATIONS: Current Outpatient Medications on File Prior to Visit  Medication Sig Dispense Refill   amLODipine (NORVASC) 5 MG tablet Take 1 tablet (5 mg total) by mouth daily. 30 tablet 1   aspirin  EC 81 MG tablet Take 81 mg by mouth every 4 (four) hours as needed (HA).     atenolol (TENORMIN) 50 MG tablet Take 3 tablets (150 mg total) by mouth daily. 90 tablet 1   benzonatate (TESSALON) 100 MG capsule Take 1 capsule (100 mg total) by mouth every 8 (eight) hours. 21 capsule 0   carbamazepine (TEGRETOL) 100 MG chewable tablet Chew 3 tablets twice daily 540 tablet 0   ferrous sulfate 325 (65 FE) MG tablet Take 325 mg by mouth daily with breakfast.     fluticasone (FLONASE) 50 MCG/ACT nasal spray Place 2 sprays into both nostrils daily. 16 mL 0   No current facility-administered medications on file prior to visit.    ALLERGIES: No Known Allergies  FAMILY HISTORY: Family History  Problem Relation Age of Onset   Hypertension Father    Seizures Paternal Grandmother       Objective:  Blood pressure (!) 164/83, pulse 95, height '5\' 2"'  (1.575 m), weight 195 lb 3.2 oz (88.5 kg), SpO2 100 %, unknown if currently breastfeeding. General: No acute distress.  Patient appears well-groomed.   Head:  Normocephalic/atraumatic Eyes:  Fundi examined but not visualized Neck: supple, no paraspinal tenderness, full range of motion Heart:  Regular rate and rhythm Lungs:  Clear to auscultation bilaterally Back: No paraspinal tenderness Neurological Exam: alert and oriented to person, place, and time.  Speech fluent and not dysarthric, language intact.  CN II-XII intact. Bulk and tone normal, muscle strength 5/5 throughout.  Sensation to light touch intact.  Deep tendon reflexes 2+ throughout, toes downgoing.  Finger to nose testing intact.  Gait normal, Romberg negative.   Metta Clines, DO  CC: Benito Mccreedy, MD

## 2021-04-17 ENCOUNTER — Encounter: Payer: Self-pay | Admitting: Neurology

## 2021-04-17 ENCOUNTER — Ambulatory Visit: Payer: Medicaid Other | Admitting: Neurology

## 2021-04-17 ENCOUNTER — Other Ambulatory Visit: Payer: Self-pay

## 2021-04-17 VITALS — BP 164/83 | HR 95 | Ht 62.0 in | Wt 195.2 lb

## 2021-04-17 DIAGNOSIS — G40001 Localization-related (focal) (partial) idiopathic epilepsy and epileptic syndromes with seizures of localized onset, not intractable, with status epilepticus: Secondary | ICD-10-CM

## 2021-04-17 MED ORDER — ZONISAMIDE 100 MG PO CAPS: 100.0000 mg | ORAL_CAPSULE | Freq: Every day | ORAL | 5 refills | Status: DC

## 2021-04-17 NOTE — Patient Instructions (Signed)
1. Start zonisamide 100mg  daily.  Continue carbamazepine 300mg  twice daily  2. Avoid activities in which a seizure would cause danger to yourself or to others.  Don't operate dangerous machinery, swim alone, or climb in high or dangerous places, such as on ladders, roofs, or girders.  Do not drive unless your doctor says you may.  3. If you have any warning that you may have a seizure, lay down in a safe place where you can't hurt yourself.    4.  No driving for 6 months from last seizure, as per [facility].   Please refer to the following link on the Epilepsy Foundation of America's website for more information: http://www.epilepsyfoundation.org/answerplace/Social/driving/drivingu.cfm   5.  Maintain good sleep hygiene.  6.  Notify your neurology if you are planning pregnancy or if you become pregnant.  7.  Contact your doctor if you have any problems that may be related to the medicine you are taking.  8.  Call 911 and bring the patient back to the ED if:        A.  The seizure lasts longer than 5 minutes.       B.  The patient doesn't awaken shortly after the seizure  C.  The patient has new problems such as difficulty seeing, speaking or moving  D.  The patient was injured during the seizure  E.  The patient has a temperature over 102 F (39C)  F.  The patient vomited and now is having trouble breathing 9.  Check CBC and CMP in 4 months.  Follow up a week after repeat labs.

## 2021-07-20 ENCOUNTER — Telehealth: Payer: Self-pay | Admitting: Neurology

## 2021-07-20 DIAGNOSIS — G40219 Localization-related (focal) (partial) symptomatic epilepsy and epileptic syndromes with complex partial seizures, intractable, without status epilepticus: Secondary | ICD-10-CM

## 2021-07-20 NOTE — Telephone Encounter (Signed)
1. Which medications need refilled? (List name and dosage, if known) carbamazepine ? ?2. Which pharmacy/location is medication to be sent to? (include street and city if local pharmacy) [facility] [address]  ? ? ?

## 2021-07-24 NOTE — Telephone Encounter (Signed)
Pt called back in stating the pharmacy didn't get the prescription from when she called in on Friday. She now also needs Zonisamide sent in. ?

## 2021-07-25 ENCOUNTER — Other Ambulatory Visit: Payer: Self-pay | Admitting: Neurology

## 2021-07-25 ENCOUNTER — Telehealth: Payer: Self-pay | Admitting: Neurology

## 2021-07-25 MED ORDER — ZONISAMIDE 100 MG PO CAPS: 100.0000 mg | ORAL_CAPSULE | Freq: Every day | ORAL | 0 refills | Status: DC

## 2021-07-25 MED ORDER — CARBAMAZEPINE 100 MG PO CHEW
CHEWABLE_TABLET | ORAL | 0 refills | Status: DC
Start: 2021-07-25 — End: 2021-10-16

## 2021-07-25 NOTE — Telephone Encounter (Signed)
Pt called in and left a message wanting to find out if her prescriptions had been sent to the pharmacy yet? ?

## 2021-08-15 NOTE — Progress Notes (Signed)
? ?NEUROLOGY FOLLOW UP OFFICE NOTE ? ?Amanda Lozano ?[id number] ? ?Assessment/Plan:  ? ?Seizure disorder ?  ?Carbamazepine 377m twice daily and zonisamide 10105mdaily ?As per  law stating no driving for 6 months after a seizure ?Follow up 6 months. ?  ?Subjective:  ?Amanda Lozano a [age]ear old woman with hypertension and depression who follows up for seizure disorder. ?  ?UPDATE: ?Current medication:  carbamazepine 30073mwice daily, zonisamide 100m63mily ?  ?No seizures.  Left her job due to stress.  Now feeling much better. ?  ?  ?HISTORY: ?Patient has a history of seizure disorder, diagnosed in [address]owever, she has experienced multiple brief "staring spells" since childhood, looking like she was daydreaming. It would only last a few seconds. In 1997, 3 months after having her second child, she had her first generalized tonic-clonic seizure. At first, it was thought to be a reaction to the Depo shot.  She denies any aura but will sometimes no fatigue. Usually they occurred at night time out of sleep. Often, she would wake up feeling fatigued. She has had some witnessed seizures and was noted to have generalized shaking with eyes rolled back. There would be a tongue laceration but no incontinence.  She was initially followed for several years by a neurologist, Dr. BoylDerrel Nip HampKlickitateportedly, MRIs of the brain were unremarkable. She also said she had several EEGs which were unremarkable.   ?  ?She had been on carbamazepine 100mg64mmorning and 200mg 39mvening for many years.  High doses caused increased lethargy.  She was seizure free from generalized tonic-clonic seizures for about 7 years up until May 2014, presumably due to having missed some doses of her medication.  She will have episodes about every 6 months or so.  She has disturbed sleep with vivid dreams.  She wakes up with the same symptoms of headache, depression and sleepiness, suggesting another seizure.  This would last a  couple of days.  Sometimes, she has bitten her tongue or lost bladder control.  It should be noted that she still occasionally has "silent" seizures, namely the staring spells. She does not drive.  24 hour ambulatory EEG was performed 12/03/13 to 12/04/13, which captured several staring spells and was a normal awake and asleep study.  Stress and heavy menses are triggers. ? ?She had done well for many years.  On 04/06/2021, she felt strange that morning.  She later was in her kitchen when she collapsed and convulsed for a couple of minutes.  She was unconscious.  She was unresponsive for a few minutes.  When she finally opened her eyes, she was confused and had trouble answering questions.  She may have bit the side of her tongue but denied incontinence.  She denies missing dose of her carbamazepine.  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 ?  ?Labs from 04/10/2021 revealed carbamazepine level 9.4, CBC with low WBC 3.5, normal electrolytes, elevated alk phos 151 but normal t bili, AST and ALT. ?  ?She does have a family history of seizures on her father's side, namely her grandmother and her cousin. She had a normal birth. She has no history of meningitis or significant head trauma.  ?  ?Past AEDs:  Dilantin (elevated LFTs), Topamax (fogginess), Lamictal (could not function) ?  ?MRI of brain without contrast from 12/17/16 was normal.  ? ?  PAST MEDICAL HISTORY: ?Past Medical History:  ?Diagnosis Date  ? High cholesterol   ? History of carpal tunnel syndrome   ? Hypertension   ? Seizures ([place])   ? Last seizure "years ago"  ? ? ?MEDICATIONS: ?Current Outpatient Medications on File Prior to Visit  ?Medication Sig Dispense Refill  ? amLODipine (NORVASC) 5 MG tablet Take 1 tablet (5 mg total) by mouth daily. 30 tablet 1  ? aspirin EC 81 MG tablet Take 81 mg by mouth every 4 (four) hours as needed (HA).    ? atenolol  (TENORMIN) 50 MG tablet Take 3 tablets (150 mg total) by mouth daily. 90 tablet 1  ? carbamazepine (TEGRETOL) 100 MG chewable tablet Chew 3 tablets twice daily 180 tablet 0  ? cholecalciferol (VITAMIN D) 25 MCG (1000 UNIT) tablet Take 1,000 Units by mouth daily.    ? ferrous sulfate 325 (65 FE) MG tablet Take 325 mg by mouth daily with breakfast.    ? fluticasone (FLONASE) 50 MCG/ACT nasal spray Place 2 sprays into both nostrils daily. 16 mL 0  ? vitamin B-12 (CYANOCOBALAMIN) 1000 MCG tablet 1 tab(s)    ? zonisamide (ZONEGRAN) 100 MG capsule Take 1 capsule (100 mg total) by mouth daily. 30 capsule 0  ? ?No current facility-administered medications on file prior to visit.  ? ? ?ALLERGIES: ?No Known Allergies ? ?FAMILY HISTORY: ?Family History  ?Problem Relation Age of Onset  ? Hypertension Father   ? Seizures Paternal Grandmother   ? ? ?  ?Objective:  ?Blood pressure 135/82, pulse 86, height '5\' 2"'  (1.575 m), weight 181 lb 9.6 oz (82.4 kg), SpO2 99 %, unknown if currently breastfeeding. ?General: No acute distress.  Patient appears well-groomed.   ?Head:  Normocephalic/atraumatic ? ? ? ?Amanda Clines, DO ? ?CC: Amanda Mccreedy, MD ? ? ? ? ? ? ?

## 2021-08-16 ENCOUNTER — Encounter: Payer: Self-pay | Admitting: Neurology

## 2021-08-16 ENCOUNTER — Ambulatory Visit: Payer: Medicaid Other | Admitting: Neurology

## 2021-08-16 VITALS — BP 135/82 | HR 86 | Ht 62.0 in | Wt 181.6 lb

## 2021-08-16 DIAGNOSIS — G40219 Localization-related (focal) (partial) symptomatic epilepsy and epileptic syndromes with complex partial seizures, intractable, without status epilepticus: Secondary | ICD-10-CM

## 2021-08-16 NOTE — Patient Instructions (Addendum)
Carbamazepine 300mg  twice daily and zonisamide 100mg  daily ?If medication has been prescribed for you to prevent seizures, take it exactly as directed.  Do not stop taking the medicine without talking to your doctor first, even if you have not had a seizure in a long time.  ?Avoid activities in which a seizure would cause danger to yourself or to others.  Don't operate dangerous machinery, swim alone, or climb in high or dangerous places, such as on ladders, roofs, or girders.  Do not drive unless your doctor says you may. ?If you have any warning that you may have a seizure, lay down in a safe place where you can't hurt yourself.   ? No driving for 6 months from last seizure, as per [facility].   Please refer to the following link on the [employer]'s website for more information: http://www.epilepsyfoundation.org/answerplace/Social/driving/drivingu.cfm  ?Maintain good sleep hygiene. ?Notify your neurology if you are planning pregnancy or if you become pregnant. ? Contact your doctor if you have any problems that may be related to the medicine you are taking. ?Call 911 and bring the patient back to the ED if: ?  ?A.  The seizure lasts longer than 5 minutes.      ? B.  The patient doesn't awaken shortly after the seizure ?C.  The patient has new problems such as difficulty seeing, speaking or moving ?D.  The patient was injured during the seizure ?E.  The patient has a temperature over 102 F (39C) ?F.  The patient vomited and now is having trouble breathing ?Follow up 6 months. ?

## 2021-10-15 ENCOUNTER — Other Ambulatory Visit: Payer: Self-pay | Admitting: Neurology

## 2021-10-15 DIAGNOSIS — G40219 Localization-related (focal) (partial) symptomatic epilepsy and epileptic syndromes with complex partial seizures, intractable, without status epilepticus: Secondary | ICD-10-CM

## 2022-02-13 NOTE — Progress Notes (Signed)
NEUROLOGY FOLLOW UP OFFICE NOTE  Amanda Lozano [id number]  Assessment/Plan:   1  Seizure disorder 2  Hypertension - BP elevated today - she states that it is always this elevated during the week of her period.    Carbamazepine 331m twice daily and zonisamide 1024mdaily She says she is going to have labs checked soon at her PCP's office (CBC, CMP) Follow up with PCP and possibly OBGYN regarding blood pressure. Follow up in 9 months.   Subjective:  [name] a 89ear old woman with hypertension and depression who follows up for seizure disorder.  She is accompanied by her husband.   UPDATE: Current medication:  carbamazepine 30068mwice daily, zonisamide 100m55mily   No seizures.  Last event was 04/06/2021.  Left her job due to stress.  Now feeling much better.     HISTORY: Patient has a history of seizure disorder, diagnosed in [id number], she has experienced multiple brief "staring spells" since childhood, looking like she was daydreaming. It would only last a few seconds. In 1997, 3 months after having her second child, she had her first generalized tonic-clonic seizure. At first, it was thought to be a reaction to the Depo shot.  She denies any aura but will sometimes no fatigue. Usually they occurred at night time out of sleep. Often, she would wake up feeling fatigued. She has had some witnessed seizures and was noted to have generalized shaking with eyes rolled back. There would be a tongue laceration but no incontinence.  She was initially followed for several years by a neurologist, Dr. BoylDerrel Nip HampRolling Fieldseportedly, MRIs of the brain were unremarkable. She also said she had several EEGs which were unremarkable.     She had been on carbamazepine 100mg32mmorning and 200mg 15mvening for many years.  High doses caused increased lethargy.  She was seizure free from generalized tonic-clonic seizures for about 7 years up until May 2014, presumably due to  having missed some doses of her medication.  She will have episodes about every 6 months or so.  She has disturbed sleep with vivid dreams.  She wakes up with the same symptoms of headache, depression and sleepiness, suggesting another seizure.  This would last a couple of days.  Sometimes, she has bitten her tongue or lost bladder control.  It should be noted that she still occasionally has "silent" seizures, namely the staring spells. She does not drive.  24 hour ambulatory EEG was performed 12/03/13 to 12/04/13, which captured several staring spells and was a normal awake and asleep study.  Stress and heavy menses are triggers.  She had done well for many years.  On 04/06/2021, she felt strange that morning.  She later was in her kitchen when she collapsed and convulsed for a couple of minutes.  She was unconscious.  She was unresponsive for a few minutes.  When she finally opened her eyes, she was confused and had trouble answering questions.  She may have bit the side of her tongue but denied incontinence.  She denies missing dose of her carbamazepine.  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   Labs from 04/10/2021 revealed carbamazepine level 9.4, CBC with low WBC 3.5, normal electrolytes, elevated alk phos 151 but normal t bili, AST and ALT.   She does have a family history of seizures on her  father's side, namely her grandmother and her cousin. She had a normal birth. She has no history of meningitis or significant head trauma.    Past AEDs:  Dilantin (elevated LFTs), Topamax (fogginess), Lamictal (could not function)   MRI of brain without contrast from 12/17/16 was normal.   PAST MEDICAL HISTORY: Past Medical History:  Diagnosis Date   High cholesterol    History of carpal tunnel syndrome    Hypertension    Seizures ([place])    Last seizure "years ago"    MEDICATIONS: Current  Outpatient Medications on File Prior to Visit  Medication Sig Dispense Refill   amLODipine (NORVASC) 5 MG tablet Take 1 tablet (5 mg total) by mouth daily. 30 tablet 1   aspirin EC 81 MG tablet Take 81 mg by mouth every 4 (four) hours as needed (HA).     atenolol (TENORMIN) 50 MG tablet Take 3 tablets (150 mg total) by mouth daily. 90 tablet 1   carbamazepine (TEGRETOL) 100 MG chewable tablet CHEW 3 TABLETS TWICE DAILY 540 tablet 1   cholecalciferol (VITAMIN D) 25 MCG (1000 UNIT) tablet Take 1,000 Units by mouth daily.     ferrous sulfate 325 (65 FE) MG tablet Take 325 mg by mouth daily with breakfast.     fluticasone (FLONASE) 50 MCG/ACT nasal spray Place 2 sprays into both nostrils daily. 16 mL 0   vitamin B-12 (CYANOCOBALAMIN) 1000 MCG tablet 1 tab(s)     zonisamide (ZONEGRAN) 100 MG capsule Take 1 capsule (100 mg total) by mouth daily. 30 capsule 0   No current facility-administered medications on file prior to visit.    ALLERGIES: No Known Allergies  FAMILY HISTORY: Family History  Problem Relation Age of Onset   Hypertension Father    Seizures Paternal Grandmother       Objective:  Blood pressure (!) 178/110, pulse 89, height '5\' 2"'  (1.575 m), weight 194 lb 6.4 oz (88.2 kg), SpO2 100 %, unknown if currently breastfeeding. General: No acute distress.  Patient appears well-groomed.   Head:  Normocephalic/atraumatic Heart:  RRR Neuro:  alert and oriented to person, place, and time.  Speech fluent and not dysarthric, language intact.  CN II-XII intact. Bulk and tone normal, muscle strength 5/5 throughout.  Sensation to light touch intact.  Deep tendon reflexes 2+ throughout.  Finger to nose testing intact.  Gait normal, Romberg negative.     Amanda Clines, DO  CC: Amanda Mccreedy, MD

## 2022-02-16 ENCOUNTER — Encounter: Payer: Self-pay | Admitting: Neurology

## 2022-02-16 ENCOUNTER — Ambulatory Visit: Payer: Medicaid Other | Admitting: Neurology

## 2022-02-16 VITALS — BP 162/88 | HR 89 | Ht 62.0 in | Wt 194.4 lb

## 2022-02-16 DIAGNOSIS — G40219 Localization-related (focal) (partial) symptomatic epilepsy and epileptic syndromes with complex partial seizures, intractable, without status epilepticus: Secondary | ICD-10-CM | POA: Diagnosis not present

## 2022-02-16 DIAGNOSIS — G40909 Epilepsy, unspecified, not intractable, without status epilepticus: Secondary | ICD-10-CM

## 2022-02-16 MED ORDER — ZONISAMIDE 100 MG PO CAPS: 100.0000 mg | ORAL_CAPSULE | Freq: Every day | ORAL | 1 refills | Status: DC

## 2022-02-16 MED ORDER — CARBAMAZEPINE 100 MG PO CHEW: 300.0000 mg | CHEWABLE_TABLET | Freq: Two times a day (BID) | ORAL | 1 refills | Status: DC

## 2022-02-16 NOTE — Patient Instructions (Signed)
Refilled carbamazepine and zonisamide Follow up 9 months.

## 2022-11-15 NOTE — Progress Notes (Deleted)
NEUROLOGY FOLLOW UP OFFICE NOTE  Amanda Lozano [id number]  Assessment/Plan:   Seizure disorder    Carbamazepine 300mg  twice daily and zonisamide 100mg  daily *** ***   Subjective:  Amanda Lozano is a 52 year old woman with hypertension and depression who follows up for seizure disorder.  She is accompanied by her husband.   UPDATE: Current medication:  carbamazepine 300mg  twice daily, zonisamide 100mg  daily   No seizures.  Last event was 04/06/2021.  Left her job due to stress.  Now feeling much better.     HISTORY: Patient has a history of seizure disorder, diagnosed in 31.  However, she has experienced multiple brief "staring spells" since childhood, looking like she was daydreaming. It would only last a few seconds. In 1997, 3 months after having her second child, she had her first generalized tonic-clonic seizure. At first, it was thought to be a reaction to the Depo shot.  She denies any aura but will sometimes no fatigue. Usually they occurred at night time out of sleep. Often, she would wake up feeling fatigued. She has had some witnessed seizures and was noted to have generalized shaking with eyes rolled back. There would be a tongue laceration but no incontinence.  She was initially followed for several years by a neurologist, Dr. Micheline Maze, in [place].  Reportedly, MRIs of the brain were unremarkable. She also said she had several EEGs which were unremarkable.     She had been on carbamazepine 100mg  in morning and 200mg  in evening for many years.  High doses caused increased lethargy.  She was seizure free from generalized tonic-clonic seizures for about 7 years up until May 2014, presumably due to having missed some doses of her medication.  She will have episodes about every 6 months or so.  She has disturbed sleep with vivid dreams.  She wakes up with the same symptoms of headache, depression and sleepiness, suggesting another seizure.  This would last a couple of  days.  Sometimes, she has bitten her tongue or lost bladder control.  It should be noted that she still occasionally has "silent" seizures, namely the staring spells. She does not drive.  24 hour ambulatory EEG was performed 12/03/13 to 12/04/13, which captured several staring spells and was a normal awake and asleep study.  Stress and heavy menses are triggers.  She had done well for many years.  On 04/06/2021, she felt strange that morning.  She later was in her kitchen when she collapsed and convulsed for a couple of minutes.  She was unconscious.  She was unresponsive for a few minutes.  When she finally opened her eyes, she was confused and had trouble answering questions.  She may have bit the side of her tongue but denied incontinence.  She denies missing dose of her carbamazepine.  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   Labs from 04/10/2021 revealed carbamazepine level 9.4, CBC with low WBC 3.5, normal electrolytes, elevated alk phos 151 but normal t bili, AST and ALT.   She does have a family history of seizures on her father's side, namely her grandmother and her cousin. She had a normal birth. She has no history of meningitis or significant head trauma.    Past AEDs:  Dilantin (elevated LFTs), Topamax (fogginess), Lamictal (could not function)   MRI of brain without contrast from 12/17/16 was normal.   PAST MEDICAL  HISTORY: Past Medical History:  Diagnosis Date   High cholesterol    History of carpal tunnel syndrome    Hypertension    Seizures ([facility])    Last seizure "years ago"    MEDICATIONS: Current Outpatient Medications on File Prior to Visit  Medication Sig Dispense Refill   amLODipine (NORVASC) 5 MG tablet Take 1 tablet (5 mg total) by mouth daily. 30 tablet 1   aspirin EC 81 MG tablet Take 81 mg by mouth daily.     atenolol (TENORMIN) 50 MG tablet Take 3 tablets  (150 mg total) by mouth daily. 90 tablet 1   carbamazepine (TEGRETOL) 100 MG chewable tablet Chew 3 tablets (300 mg total) by mouth in the morning and at bedtime. 540 tablet 1   cholecalciferol (VITAMIN D) 25 MCG (1000 UNIT) tablet Take 1,000 Units by mouth daily.     ferrous sulfate 325 (65 FE) MG tablet Take 325 mg by mouth daily with breakfast.     vitamin B-12 (CYANOCOBALAMIN) 1000 MCG tablet 1 tab(s)     zonisamide (ZONEGRAN) 100 MG capsule Take 1 capsule (100 mg total) by mouth daily. 90 capsule 1   No current facility-administered medications on file prior to visit.    ALLERGIES: No Known Allergies  FAMILY HISTORY: Family History  Problem Relation Age of Onset   Hypertension Father    Seizures Paternal Grandmother       Objective:  *** General: No acute distress.  Patient appears well-groomed.   Head:  Normocephalic/atraumatic Heart:  RRR Neuro:  ***     Shon Millet, DO  CC: [name], MD

## 2022-11-19 ENCOUNTER — Ambulatory Visit: Payer: Medicaid Other | Admitting: Neurology

## 2022-11-21 NOTE — Progress Notes (Deleted)
 NEUROLOGY FOLLOW UP OFFICE NOTE  Amanda Lozano [id number]  Assessment/Plan:   Seizure disorder    Carbamazepine 300mg  twice daily and zonisamide 100mg  daily *** ***   Subjective:  Amanda Lozano is a 52 year old woman with hypertension and depression who follows up for seizure disorder.  She is accompanied by her husband.   UPDATE: Current medication:  carbamazepine 300mg  twice daily, zonisamide 100mg  daily   No seizures.  Last event was 04/06/2021.  Left her job due to stress.  Now feeling much better.     HISTORY: Patient has a history of seizure disorder, diagnosed in 31.  However, she has experienced multiple brief "staring spells" since childhood, looking like she was daydreaming. It would only last a few seconds. In 1997, 3 months after having her second child, she had her first generalized tonic-clonic seizure. At first, it was thought to be a reaction to the Depo shot.  She denies any aura but will sometimes no fatigue. Usually they occurred at night time out of sleep. Often, she would wake up feeling fatigued. She has had some witnessed seizures and was noted to have generalized shaking with eyes rolled back. There would be a tongue laceration but no incontinence.  She was initially followed for several years by a neurologist, Dr. Micheline Maze, in [place].  Reportedly, MRIs of the brain were unremarkable. She also said she had several EEGs which were unremarkable.     She had been on carbamazepine 100mg  in morning and 200mg  in evening for many years.  High doses caused increased lethargy.  She was seizure free from generalized tonic-clonic seizures for about 7 years up until May 2014, presumably due to having missed some doses of her medication.  She will have episodes about every 6 months or so.  She has disturbed sleep with vivid dreams.  She wakes up with the same symptoms of headache, depression and sleepiness, suggesting another seizure.  This would last a couple of  days.  Sometimes, she has bitten her tongue or lost bladder control.  It should be noted that she still occasionally has "silent" seizures, namely the staring spells. She does not drive.  24 hour ambulatory EEG was performed 12/03/13 to 12/04/13, which captured several staring spells and was a normal awake and asleep study.  Stress and heavy menses are triggers.  She had done well for many years.  On 04/06/2021, she felt strange that morning.  She later was in her kitchen when she collapsed and convulsed for a couple of minutes.  She was unconscious.  She was unresponsive for a few minutes.  When she finally opened her eyes, she was confused and had trouble answering questions.  She may have bit the side of her tongue but denied incontinence.  She denies missing dose of her carbamazepine.  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   Labs from 04/10/2021 revealed carbamazepine level 9.4, CBC with low WBC 3.5, normal electrolytes, elevated alk phos 151 but normal t bili, AST and ALT.   She does have a family history of seizures on her father's side, namely her grandmother and her cousin. She had a normal birth. She has no history of meningitis or significant head trauma.    Past AEDs:  Dilantin (elevated LFTs), Topamax (fogginess), Lamictal (could not function)   MRI of brain without contrast from 12/17/16 was normal.   PAST MEDICAL  HISTORY: Past Medical History:  Diagnosis Date   High cholesterol    History of carpal tunnel syndrome    Hypertension    Seizures ([facility])    Last seizure "years ago"    MEDICATIONS: Current Outpatient Medications on File Prior to Visit  Medication Sig Dispense Refill   amLODipine (NORVASC) 5 MG tablet Take 1 tablet (5 mg total) by mouth daily. 30 tablet 1   aspirin EC 81 MG tablet Take 81 mg by mouth daily.     atenolol (TENORMIN) 50 MG tablet Take 3 tablets  (150 mg total) by mouth daily. 90 tablet 1   carbamazepine (TEGRETOL) 100 MG chewable tablet Chew 3 tablets (300 mg total) by mouth in the morning and at bedtime. 540 tablet 1   cholecalciferol (VITAMIN D) 25 MCG (1000 UNIT) tablet Take 1,000 Units by mouth daily.     ferrous sulfate 325 (65 FE) MG tablet Take 325 mg by mouth daily with breakfast.     vitamin B-12 (CYANOCOBALAMIN) 1000 MCG tablet 1 tab(s)     zonisamide (ZONEGRAN) 100 MG capsule Take 1 capsule (100 mg total) by mouth daily. 90 capsule 1   No current facility-administered medications on file prior to visit.    ALLERGIES: No Known Allergies  FAMILY HISTORY: Family History  Problem Relation Age of Onset   Hypertension Father    Seizures Paternal Grandmother       Objective:  *** General: No acute distress.  Patient appears well-groomed.   Head:  Normocephalic/atraumatic Heart:  RRR Neuro:  ***     Shon Millet, DO  CC: [name], MD

## 2022-11-22 ENCOUNTER — Ambulatory Visit: Payer: Medicaid Other | Admitting: Neurology

## 2022-11-23 ENCOUNTER — Ambulatory Visit: Payer: Medicaid Other | Admitting: Neurology

## 2022-11-23 ENCOUNTER — Other Ambulatory Visit: Payer: Self-pay

## 2022-11-23 ENCOUNTER — Other Ambulatory Visit: Payer: Self-pay | Admitting: Neurology

## 2022-11-23 ENCOUNTER — Telehealth: Payer: Self-pay | Admitting: Neurology

## 2022-11-23 DIAGNOSIS — G40219 Localization-related (focal) (partial) symptomatic epilepsy and epileptic syndromes with complex partial seizures, intractable, without status epilepticus: Secondary | ICD-10-CM

## 2022-11-23 MED ORDER — CARBAMAZEPINE 100 MG PO CHEW: 300.0000 mg | CHEWABLE_TABLET | Freq: Two times a day (BID) | ORAL | 0 refills | Status: DC

## 2022-11-23 NOTE — Telephone Encounter (Signed)
Pt is not able to keep her appt for today due to a family emergency  so she resch to 12-28-22. Sje need a refill on the carbamazepine  called into the [facility] [address]   She also wanted to let you know that her blood work  would be done next week  at PCP so she will have them sent to [place]

## 2022-11-23 NOTE — Telephone Encounter (Signed)
Called patient and let her know that meds have been sent in for her to [facility] [address]

## 2022-12-27 NOTE — Progress Notes (Deleted)
NEUROLOGY FOLLOW UP OFFICE NOTE  Amanda Lozano [id number]  Assessment/Plan:   Seizure disorder  Carbamazepine 300mg  twice daily and zonisamide 100mg  daily Labs *** Follow up ***  Subjective:  Amanda Lozano is a 52 year old woman with hypertension and depression who follows up for seizure disorder.  She is accompanied by her husband.   UPDATE: Current medication:  carbamazepine 300mg  twice daily, zonisamide 100mg  daily   No seizures.  Last event was 04/06/2021.  ***     HISTORY: Patient has a history of seizure disorder, diagnosed in 41.  However, she has experienced multiple brief "staring spells" since childhood, looking like she was daydreaming. It would only last a few seconds. In 1997, 3 months after having her second child, she had her first generalized tonic-clonic seizure. At first, it was thought to be a reaction to the Depo shot.  She denies any aura but will sometimes no fatigue. Usually they occurred at night time out of sleep. Often, she would wake up feeling fatigued. She has had some witnessed seizures and was noted to have generalized shaking with eyes rolled back. There would be a tongue laceration but no incontinence.  She was initially followed for several years by a neurologist, Dr. Micheline Maze, in [place].  Reportedly, MRIs of the brain were unremarkable. She also said she had several EEGs which were unremarkable.     She had been on carbamazepine 100mg  in morning and 200mg  in evening for many years.  High doses caused increased lethargy.  She was seizure free from generalized tonic-clonic seizures for about 7 years up until May 2014, presumably due to having missed some doses of her medication.  She will have episodes about every 6 months or so.  She has disturbed sleep with vivid dreams.  She wakes up with the same symptoms of headache, depression and sleepiness, suggesting another seizure.  This would last a couple of days.  Sometimes, she has bitten her  tongue or lost bladder control.  It should be noted that she still occasionally has "silent" seizures, namely the staring spells. She does not drive.  24 hour ambulatory EEG was performed 12/03/13 to 12/04/13, which captured several staring spells and was a normal awake and asleep study.  Stress and heavy menses are triggers.  She had done well for many years.  On 04/06/2021, she felt strange that morning.  She later was in her kitchen when she collapsed and convulsed for a couple of minutes.  She was unconscious.  She was unresponsive for a few minutes.  When she finally opened her eyes, she was confused and had trouble answering questions.  She may have bit the side of her tongue but denied incontinence.  She denies missing dose of her carbamazepine.  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   Labs from 04/10/2021 revealed carbamazepine level 9.4, CBC with low WBC 3.5, normal electrolytes, elevated alk phos 151 but normal t bili, AST and ALT.   She does have a family history of seizures on her father's side, namely her grandmother and her cousin. She had a normal birth. She has no history of meningitis or significant head trauma.    Past AEDs:  Dilantin (elevated LFTs), Topamax (fogginess), Lamictal (could not function)   MRI of brain without contrast from 12/17/16 was normal.   PAST MEDICAL HISTORY: Past Medical History:  Diagnosis Date   High  cholesterol    History of carpal tunnel syndrome    Hypertension    Seizures ([facility])    Last seizure "years ago"    MEDICATIONS: Current Outpatient Medications on File Prior to Visit  Medication Sig Dispense Refill   amLODipine (NORVASC) 5 MG tablet Take 1 tablet (5 mg total) by mouth daily. 30 tablet 1   aspirin EC 81 MG tablet Take 81 mg by mouth daily.     atenolol (TENORMIN) 50 MG tablet Take 3 tablets (150 mg total) by mouth daily. 90  tablet 1   carbamazepine (TEGRETOL) 100 MG chewable tablet Chew 3 tablets (300 mg total) by mouth in the morning and at bedtime. 540 tablet 0   cholecalciferol (VITAMIN D) 25 MCG (1000 UNIT) tablet Take 1,000 Units by mouth daily.     ferrous sulfate 325 (65 FE) MG tablet Take 325 mg by mouth daily with breakfast.     vitamin B-12 (CYANOCOBALAMIN) 1000 MCG tablet 1 tab(s)     zonisamide (ZONEGRAN) 100 MG capsule Take 1 capsule (100 mg total) by mouth daily. 90 capsule 1   No current facility-administered medications on file prior to visit.    ALLERGIES: No Known Allergies  FAMILY HISTORY: Family History  Problem Relation Age of Onset   Hypertension Father    Seizures Paternal Grandmother       Objective:  *** General: No acute distress.  Patient appears well-groomed.   Head:  Normocephalic/atraumatic Neck:  Supple.  No paraspinal tenderness.  Full range of motion. Heart:  Regular rate and rhythm. Neuro:  Alert and oriented.  Speech fluent and not dysarthric.  Language intact.  CN II-XII intact.  Bulk and tone normal.  Muscle strength 5/5 throughout.  Deep tendon reflexes 2+ throughout.  Gait normal.  Romberg negative.      Shon Millet, DO  CC: [name], MD

## 2022-12-28 ENCOUNTER — Ambulatory Visit: Payer: Medicaid Other | Admitting: Neurology

## 2023-02-06 ENCOUNTER — Other Ambulatory Visit: Payer: Self-pay

## 2023-02-06 ENCOUNTER — Emergency Department (HOSPITAL_COMMUNITY)
Admission: EM | Admit: 2023-02-06 | Discharge: 2023-02-06 | Disposition: A | Discharge status: Home / Self Care | Payer: Medicaid Other | Attending: Emergency Medicine | Admitting: Emergency Medicine

## 2023-02-06 DIAGNOSIS — Z79899 Other long term (current) drug therapy: Secondary | ICD-10-CM | POA: Diagnosis not present

## 2023-02-06 DIAGNOSIS — R1030 Lower abdominal pain, unspecified: Secondary | ICD-10-CM | POA: Insufficient documentation

## 2023-02-06 DIAGNOSIS — R109 Unspecified abdominal pain: Secondary | ICD-10-CM

## 2023-02-06 DIAGNOSIS — Z7982 Long term (current) use of aspirin: Secondary | ICD-10-CM | POA: Insufficient documentation

## 2023-02-06 DIAGNOSIS — E876 Hypokalemia: Secondary | ICD-10-CM

## 2023-02-06 LAB — COMPREHENSIVE METABOLIC PANEL
ALT: 23 U/L (ref 0–44)
AST: 34 U/L (ref 15–41)
Albumin: 3.2 g/dL — ABNORMAL LOW (ref 3.5–5.0)
Alkaline Phosphatase: 142 U/L — ABNORMAL HIGH (ref 38–126)
Anion gap: 10 (ref 5–15)
BUN: 8 mg/dL (ref 6–20)
CO2: 23 mmol/L (ref 22–32)
Calcium: 8.5 mg/dL — ABNORMAL LOW (ref 8.9–10.3)
Chloride: 103 mmol/L (ref 98–111)
Creatinine, Ser: 0.56 mg/dL (ref 0.44–1.00)
GFR, Estimated: >60 mL/min (ref >60)
Glucose, Bld: 94 mg/dL (ref 70–99)
Potassium: 3.1 mmol/L — ABNORMAL LOW (ref 3.5–5.1)
Sodium: 136 mmol/L (ref 135–145)
Total Bilirubin: 0.5 mg/dL (ref 0.3–1.2)
Total Protein: 8.4 g/dL — ABNORMAL HIGH (ref 6.5–8.1)

## 2023-02-06 LAB — URINALYSIS, ROUTINE W REFLEX MICROSCOPIC
Bilirubin Urine: NEGATIVE
Glucose, UA: NEGATIVE mg/dL
Hgb urine dipstick: NEGATIVE
Ketones, ur: NEGATIVE mg/dL
Leukocytes,Ua: NEGATIVE
Nitrite: NEGATIVE
Protein, ur: NEGATIVE mg/dL
Specific Gravity, Urine: 1.013 (ref 1.005–1.030)
pH: 6 (ref 5.0–8.0)

## 2023-02-06 LAB — CBC
HCT: 37.3 % (ref 36.0–46.0)
Hemoglobin: 11.7 g/dL — ABNORMAL LOW (ref 12.0–15.0)
MCH: 27.8 pg (ref 26.0–34.0)
MCHC: 31.4 g/dL (ref 30.0–36.0)
MCV: 88.6 fL (ref 80.0–100.0)
Platelets: 173 10*3/uL (ref 150–400)
RBC: 4.21 MIL/uL (ref 3.87–5.11)
RDW: 13.3 % (ref 11.5–15.5)
WBC: 2.5 10*3/uL — ABNORMAL LOW (ref 4.0–10.5)
nRBC: 0 % (ref 0.0–0.2)

## 2023-02-06 LAB — LIPASE, BLOOD: Lipase: 25 U/L (ref 11–51)

## 2023-02-06 LAB — HCG, SERUM, QUALITATIVE: Preg, Serum: NEGATIVE

## 2023-02-06 MED ORDER — POTASSIUM CHLORIDE CRYS ER 20 MEQ PO TBCR
40.0000 meq | EXTENDED_RELEASE_TABLET | Freq: Once | ORAL | Status: AC
  Administered 2023-02-06: 40 meq via ORAL
  Filled 2023-02-06: qty 2

## 2023-02-06 NOTE — Discharge Instructions (Signed)
Have your potassium repeated sometime next week.

## 2023-02-06 NOTE — ED Provider Notes (Signed)
[address] EMERGENCY DEPARTMENT AT [facility] Provider Note   CSN: [id number] Arrival date & time: 02/06/23  4098     History  Chief Complaint  Patient presents with   Abdominal Pain    Amanda Lozano is a 52 y.o. female.  52 year old female presents with abdominal pain x 48 hours.  States has been lower abdominal region and associated with crampiness.  Denies any vaginal bleeding or discharge.  Denies any urinary symptoms.  No fever chills pain or nausea or vomiting.  Denies any diarrhea.  Has been using ibuprofen with complete relief.  No prior history of same.  No change to her bowel movements       Home Medications Prior to Admission medications   Medication Sig Start Date End Date Taking? Authorizing Provider  amLODipine (NORVASC) 5 MG tablet Take 1 tablet (5 mg total) by mouth daily. 11/18/16   Katrinka Blazing, [place], CNM  aspirin EC 81 MG tablet Take 81 mg by mouth daily.    [provider]  atenolol (TENORMIN) 50 MG tablet Take 3 tablets (150 mg total) by mouth daily. 02/10/17   Vassie Loll, MD  carbamazepine (TEGRETOL) 100 MG chewable tablet Chew 3 tablets (300 mg total) by mouth in the morning and at bedtime. 11/23/22   Tat, Octaviano Batty, DO  cholecalciferol (VITAMIN D) 25 MCG (1000 UNIT) tablet Take 1,000 Units by mouth daily. 01/24/21   [provider]  ferrous sulfate 325 (65 FE) MG tablet Take 325 mg by mouth daily with breakfast.    [provider]  vitamin B-12 (CYANOCOBALAMIN) 1000 MCG tablet 1 tab(s) 05/29/19   [provider]  zonisamide (ZONEGRAN) 100 MG capsule Take 1 capsule (100 mg total) by mouth daily. 02/16/22   Drema Dallas, DO      Allergies    Patient has no known allergies.    Review of Systems   Review of Systems  All other systems reviewed and are negative.   Physical Exam Updated Vital Signs BP (!) 183/99 (BP Location: Right Arm)   Pulse 86   Temp 98.3 F (36.8 C) (Oral)   Resp 16   Ht 1.575 m (5'  2")   Wt 77.1 kg   SpO2 100%   BMI 31.09 kg/m  Physical Exam Vitals and nursing note reviewed.  Constitutional:      General: She is not in acute distress.    Appearance: Normal appearance. She is well-developed. She is not toxic-appearing.  HENT:     Head: Normocephalic and atraumatic.  Eyes:     General: Lids are normal.     Conjunctiva/sclera: Conjunctivae normal.     Pupils: Pupils are equal, round, and reactive to light.  Neck:     Thyroid: No thyroid mass.     Trachea: No tracheal deviation.  Cardiovascular:     Rate and Rhythm: Normal rate and regular rhythm.     Heart sounds: Normal heart sounds. No murmur heard.    No gallop.  Pulmonary:     Effort: Pulmonary effort is normal. No respiratory distress.     Breath sounds: Normal breath sounds. No stridor. No decreased breath sounds, wheezing, rhonchi or rales.  Abdominal:     General: There is no distension.     Palpations: Abdomen is soft.     Tenderness: There is no abdominal tenderness. There is no rebound.  Musculoskeletal:        General: No tenderness. Normal range of motion.  Cervical back: Normal range of motion and neck supple.  Skin:    General: Skin is warm and dry.     Findings: No abrasion or rash.  Neurological:     Mental Status: She is alert and oriented to person, place, and time. Mental status is at baseline.     GCS: GCS eye subscore is 4. GCS verbal subscore is 5. GCS motor subscore is 6.     Cranial Nerves: Cranial nerves are intact. No cranial nerve deficit.     Sensory: No sensory deficit.     Motor: Motor function is intact.  Psychiatric:        Attention and Perception: Attention normal.        Speech: Speech normal.        Behavior: Behavior normal.     ED Results / Procedures / Treatments   Labs (all labs ordered are listed, but only abnormal results are displayed) Labs Reviewed  COMPREHENSIVE METABOLIC PANEL - Abnormal; Notable for the following components:      Result Value    Potassium 3.1 (*)    Calcium 8.5 (*)    Total Protein 8.4 (*)    Albumin 3.2 (*)    Alkaline Phosphatase 142 (*)    All other components within normal limits  CBC - Abnormal; Notable for the following components:   WBC 2.5 (*)    Hemoglobin 11.7 (*)    All other components within normal limits  LIPASE, BLOOD  URINALYSIS, ROUTINE W REFLEX MICROSCOPIC  HCG, SERUM, QUALITATIVE    EKG None  Radiology No results found.  Procedures Procedures    Medications Ordered in ED Medications - No data to display  ED Course/ Medical Decision Making/ A&P                                 Medical Decision Making Amount and/or Complexity of Data Reviewed Labs: ordered.   Patient is abdominal exam benign at this time.  She has no tenderness in her right lower quadrant or left lower quadrant.  Low suspicion for appendicitis or ovarian torsion.  Mild hypokalemia noted with K of 3.1 will be given oral potassium.  Her CBC shows no evidence of leukocytosis.  Lipase is normal.  Urinalysis without infection.  Do not feel that she needs emergent abdominal CT at this time.  Plan will be for patient to continue using her ibuprofen and strict return precautions given.  Patient comfortable with this        Final Clinical Impression(s) / ED Diagnoses Final diagnoses:  None    Rx / DC Orders ED Discharge Orders     None         Lorre Nick, MD 02/06/23 [phone]

## 2023-02-06 NOTE — ED Triage Notes (Addendum)
Pt arrives to ED c/o lower abd pain that radiates to lower back x 2 days, Pt endorses chills and slight dysuria. Pt denies N/V

## 2023-03-20 NOTE — Progress Notes (Unsigned)
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 Will discontinue zonisamide. Follow up on labs drawn today (CBC and CMP) Follow up one year   Subjective:  Amanda Lozano is a 52 year old woman with hypertension and depression who follows up for seizure disorder.     UPDATE: Current medication:  carbamazepine 300mg  twice daily, zonisamide 100mg  daily   No seizures.  Last event was 04/06/2021.   Doing well.  She has been able to control her stress.  She reports that the zonisamide is causing diarrhea.     02/06/2023 LABS:  CBC with WBC 2.5, HGB 11.7, HCT 37.3, PLT 173; CMP with Na 136, K 3.1, Cl 103, CO2 23, glucose 94, BUN 8, Cr 0.56, Ca 8.5, t bili 0.5, ALP 142, AST 34, ALT 23.     HISTORY: Patient has a history of seizure disorder, diagnosed in 53.  However, she has experienced multiple brief "staring spells" since childhood, looking like she was daydreaming. It would only last a few seconds. In 1997, 3 months after having her second child, she had her first generalized tonic-clonic seizure. At first, it was thought to be a reaction to the Depo shot.  She denies any aura but will sometimes no fatigue. Usually they occurred at night time out of sleep. Often, she would wake up feeling fatigued. She has had some witnessed seizures and was noted to have generalized shaking with eyes rolled back. There would be a tongue laceration but no incontinence.  She was initially followed for several years by a neurologist, Dr. Micheline Maze, in [employer].  Reportedly, MRIs of the brain were unremarkable. She also said she had several EEGs which were unremarkable.     She had been on carbamazepine 100mg  in morning and 200mg  in evening for many years.  High doses caused increased lethargy.  She was seizure free from generalized tonic-clonic seizures for about 7 years up until May  2014, presumably due to having missed some doses of her medication.  She will have episodes about every 6 months or so.  She has disturbed sleep with vivid dreams.  She wakes up with the same symptoms of headache, depression and sleepiness, suggesting another seizure.  This would last a couple of days.  Sometimes, she has bitten her tongue or lost bladder control.  It should be noted that she still occasionally has "silent" seizures, namely the staring spells. She does not drive.  24 hour ambulatory EEG was performed 12/03/13 to 12/04/13, which captured several staring spells and was a normal awake and asleep study.  Stress and heavy menses are triggers.  She had done well for many years.  On 04/06/2021, she felt strange that morning.  She later was in her kitchen when she collapsed and convulsed for a couple of minutes.  She was unconscious.  She was unresponsive for a few minutes.  When she finally opened her eyes, she was confused and had trouble answering questions.  She may have bit the side of her tongue but denied incontinence.  She denies missing dose of her carbamazepine.  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   Labs from 04/10/2021 revealed carbamazepine level 9.4, CBC with low WBC 3.5, normal electrolytes, elevated alk phos 151 but normal t bili, AST and ALT.   She  does have a family history of seizures on her father's side, namely her grandmother and her cousin. She had a normal birth. She has no history of meningitis or significant head trauma.    Past AEDs:  Dilantin (elevated LFTs), Topamax (fogginess), Lamictal (could not function)   MRI of brain without contrast from 12/17/16 was normal.   PAST MEDICAL HISTORY: Past Medical History:  Diagnosis Date   High cholesterol    History of carpal tunnel syndrome    Hypertension    Seizures (HCC)    Last seizure "years ago"     MEDICATIONS: Current Outpatient Medications on File Prior to Visit  Medication Sig Dispense Refill   amLODipine (NORVASC) 5 MG tablet Take 1 tablet (5 mg total) by mouth daily. 30 tablet 1   aspirin EC 81 MG tablet Take 81 mg by mouth daily.     atenolol (TENORMIN) 50 MG tablet Take 3 tablets (150 mg total) by mouth daily. 90 tablet 1   carbamazepine (TEGRETOL) 100 MG chewable tablet Chew 3 tablets (300 mg total) by mouth in the morning and at bedtime. 540 tablet 0   cholecalciferol (VITAMIN D) 25 MCG (1000 UNIT) tablet Take 1,000 Units by mouth daily.     ferrous sulfate 325 (65 FE) MG tablet Take 325 mg by mouth daily with breakfast.     vitamin B-12 (CYANOCOBALAMIN) 1000 MCG tablet 1 tab(s)     zonisamide (ZONEGRAN) 100 MG capsule Take 1 capsule (100 mg total) by mouth daily. 90 capsule 1   No current facility-administered medications on file prior to visit.    ALLERGIES: No Known Allergies  FAMILY HISTORY: Family History  Problem Relation Age of Onset   Hypertension Father    Seizures Paternal Grandmother       Objective:  Blood pressure (!) 148/86, pulse 81, height 5\' 2"  (1.575 m), weight 190 lb (86.2 kg), SpO2 100%, unknown if currently breastfeeding. General: No acute distress.  Patient appears well-groomed.   Head:  Normocephalic/atraumatic Heart:  RRR Neuro:  alert and oriented.  Speech fluent and not dysarthric, language intact.  CN II-XII intact. Bulk and tone normal, muscle strength 5/5 throughout.  Sensation to light touch intact.  Deep tendon reflexes 2+ throughout.  Finger to nose testing intact.  Gait normal, Romberg negative.     Shon Millet, DO  CC: [name], MD

## 2023-03-21 ENCOUNTER — Telehealth: Payer: Self-pay

## 2023-03-21 ENCOUNTER — Other Ambulatory Visit (INDEPENDENT_AMBULATORY_CARE_PROVIDER_SITE_OTHER): Payer: Medicaid Other

## 2023-03-21 ENCOUNTER — Ambulatory Visit: Payer: Medicaid Other | Admitting: Neurology

## 2023-03-21 ENCOUNTER — Encounter: Payer: Self-pay | Admitting: Neurology

## 2023-03-21 DIAGNOSIS — Z79899 Other long term (current) drug therapy: Secondary | ICD-10-CM | POA: Diagnosis not present

## 2023-03-21 DIAGNOSIS — G40219 Localization-related (focal) (partial) symptomatic epilepsy and epileptic syndromes with complex partial seizures, intractable, without status epilepticus: Secondary | ICD-10-CM | POA: Diagnosis not present

## 2023-03-21 LAB — COMPREHENSIVE METABOLIC PANEL
ALT: 19 U/L (ref 0–35)
AST: 26 U/L (ref 0–37)
Albumin: 3.9 g/dL (ref 3.5–5.2)
Alkaline Phosphatase: 148 U/L — ABNORMAL HIGH (ref 39–117)
BUN: 11 mg/dL (ref 6–23)
CO2: 26 meq/L (ref 19–32)
Calcium: 9.2 mg/dL (ref 8.4–10.5)
Chloride: 101 meq/L (ref 96–112)
Creatinine, Ser: 0.68 mg/dL (ref 0.40–1.20)
GFR: 101.32 mL/min (ref >60.00)
Glucose, Bld: 120 mg/dL — ABNORMAL HIGH (ref 70–99)
Potassium: 3.8 meq/L (ref 3.5–5.1)
Sodium: 135 meq/L (ref 135–145)
Total Bilirubin: 0.3 mg/dL (ref 0.2–1.2)
Total Protein: 9.3 g/dL — ABNORMAL HIGH (ref 6.0–8.3)

## 2023-03-21 LAB — CBC
HCT: 38 % (ref 36.0–46.0)
Hemoglobin: 12.2 g/dL (ref 12.0–15.0)
MCHC: 32.1 g/dL (ref 30.0–36.0)
MCV: 87.2 fL (ref 78.0–100.0)
Platelets: 213 10*3/uL (ref 150.0–400.0)
RBC: 4.36 Mil/uL (ref 3.87–5.11)
RDW: 14.1 % (ref 11.5–15.5)
WBC: 3 10*3/uL — ABNORMAL LOW (ref 4.0–10.5)

## 2023-03-21 MED ORDER — CARBAMAZEPINE 100 MG PO CHEW: 300.0000 mg | CHEWABLE_TABLET | Freq: Two times a day (BID) | ORAL | 3 refills | Status: DC

## 2023-03-21 NOTE — Patient Instructions (Signed)
Continue carbamazepine 100mg :  3 pills in morning and 3 pills at bedtime Stop zonisamide Follow up on labs performed today Follow up in one year

## 2023-06-11 NOTE — Telephone Encounter (Signed)
 labs

## 2024-02-28 ENCOUNTER — Other Ambulatory Visit: Payer: Self-pay | Admitting: Neurology

## 2024-02-28 ENCOUNTER — Telehealth: Payer: Self-pay | Admitting: Neurology

## 2024-02-28 DIAGNOSIS — G40219 Localization-related (focal) (partial) symptomatic epilepsy and epileptic syndromes with complex partial seizures, intractable, without status epilepticus: Secondary | ICD-10-CM

## 2024-02-28 MED ORDER — CARBAMAZEPINE 100 MG PO CHEW: 300.0000 mg | CHEWABLE_TABLET | Freq: Two times a day (BID) | ORAL | 0 refills | Status: DC

## 2024-02-28 NOTE — Telephone Encounter (Signed)
 Patient had a appt on 03-03-24 and we had to move it out until Jan 5,2026 . She needs her medication called into the [facility] on [address]   carbamazepine 

## 2024-03-03 ENCOUNTER — Ambulatory Visit: Admitting: Neurology

## 2024-03-23 ENCOUNTER — Ambulatory Visit: Payer: Medicaid Other | Admitting: Neurology

## 2024-05-05 ENCOUNTER — Telehealth: Payer: Self-pay | Admitting: Neurology

## 2024-05-05 NOTE — Telephone Encounter (Signed)
 Pt would like work note as her employer is trying to make her work nights, Can you provided her a work note that excuses Night time work Currently works to [employer].

## 2024-05-05 NOTE — Telephone Encounter (Signed)
 Patient advised per [facility] letter has to be discussed during her Follow up.    Per Patient her follow appt's were rescheduled due to the office twice. So is wondering since the letter needs to be done by next week if that could be written.   Per patient when she takes her medication at night it make her sleepy. She do not want to risk a seizure.

## 2024-05-11 ENCOUNTER — Ambulatory Visit: Admitting: Neurology

## 2024-05-11 NOTE — Progress Notes (Signed)
 "  NEUROLOGY FOLLOW UP OFFICE NOTE  Amanda Lozano [id number]  Assessment/Plan:   Seizure disorder, unclear if there is a non-epileptic etiology    Carbamazepine  300mg  twice daily. Check CBC and CMP Follow up with PCP regarding blood pressure Follow up one year   Subjective:  Amanda Lozano is a 54 year old woman with hypertension and depression who follows up for seizure disorder.     UPDATE: Current medication:  carbamazepine  300mg  twice daily,    No seizures.  Last event was 04/06/2021.   She is working where she needs to work overnight.  She is going through menopause with poor sleep hygiene and with the extra stress, she is concerned that working overnight will trigger a seizure.      HISTORY: Patient has a history of seizure disorder, diagnosed in 6.  However, she has experienced multiple brief staring spells since childhood, looking like she was daydreaming. It would only last a few seconds. In 1997, 3 months after having her second child, she had her first generalized tonic-clonic seizure. At first, it was thought to be a reaction to the Depo shot.  She denies any aura but will sometimes no fatigue. Usually they occurred at night time out of sleep. Often, she would wake up feeling fatigued. She has had some witnessed seizures and was noted to have generalized shaking with eyes rolled back. There would be a tongue laceration but no incontinence.  She was initially followed for several years by a neurologist, Dr. Jearld, in [place] .  Reportedly, MRIs of the brain were unremarkable. She also said she had several EEGs which were unremarkable.     She had been on carbamazepine  100mg  in morning and 200mg  in evening for many years.  High doses caused increased lethargy.  She was seizure free from generalized tonic-clonic seizures for about 7 years up until May 2014, presumably due to having missed some doses of her medication.  She will have episodes about every 6 months  or so.  She has disturbed sleep with vivid dreams.  She wakes up with the same symptoms of headache, depression and sleepiness, suggesting another seizure.  This would last a couple of days.  Sometimes, she has bitten her tongue or lost bladder control.  It should be noted that she still occasionally has silent seizures, namely the staring spells. She does not drive.  24 hour ambulatory EEG was performed 12/03/13 to 12/04/13, which captured several staring spells and was a normal awake and asleep study.  Stress and heavy menses are triggers.  She had done well for many years.  On 04/06/2021, she felt strange that morning.  She later was in her kitchen when she collapsed and convulsed for a couple of minutes.  She was unconscious.  She was unresponsive for a few minutes.  When she finally opened her eyes, she was confused and had trouble answering questions.  She may have bit the side of her tongue but denied incontinence.  She denies missing dose of her carbamazepine .  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  She hasn't returned to work since the seizure as she has been feeling drained.   Labs from 04/10/2021 revealed carbamazepine  level 9.4, CBC with low WBC 3.5, normal electrolytes, elevated alk phos 151 but normal t bili, AST and ALT.   She does have a family history of seizures on her father's side, namely her grandmother and her cousin. She had a normal birth. She has no history of  meningitis or significant head trauma.    Past AEDs:  Dilantin (elevated LFTs), Topamax (fogginess), Lamictal  (could not function), zonisamide  (diarrhea)   MRI of brain without contrast from 12/17/16 was normal.   PAST MEDICAL HISTORY: Past Medical History:  Diagnosis Date   High cholesterol    History of carpal tunnel syndrome    Hypertension    Seizures (HCC)    Last seizure years ago    MEDICATIONS: Current Outpatient Medications on File Prior to Visit  Medication Sig Dispense  Refill   amLODipine  (NORVASC ) 5 MG tablet Take 1 tablet (5 mg total) by mouth daily. 30 tablet 1   aspirin EC 81 MG tablet Take 81 mg by mouth daily.     atenolol  (TENORMIN ) 50 MG tablet Take 3 tablets (150 mg total) by mouth daily. 90 tablet 1   carbamazepine  (TEGRETOL ) 100 MG chewable tablet Chew 3 tablets (300 mg total) by mouth in the morning and at bedtime. 540 tablet 0   cholecalciferol (VITAMIN D) 25 MCG (1000 UNIT) tablet Take 1,000 Units by mouth daily.     ferrous sulfate  325 (65 FE) MG tablet Take 325 mg by mouth daily with breakfast.     vitamin B-12 (CYANOCOBALAMIN) 1000 MCG tablet 1 tab(s)     No current facility-administered medications on file prior to visit.    ALLERGIES: No Known Allergies  FAMILY HISTORY: Family History  Problem Relation Age of Onset   Hypertension Father    Seizures Paternal Grandmother       Objective:  Blood pressure (!) 160/100, pulse 80, height 5' 3 (1.6 m), weight 182 lb 6.4 oz (82.7 kg), SpO2 99%, unknown if currently breastfeeding. General: No acute distress.  Patient appears well-groomed.   Head:  Normocephalic/atraumatic Neck:  Supple.  No paraspinal tenderness.  Full range of motion. Heart:  Regular rate and rhythm. Neuro:  Alert and oriented.  Speech fluent and not dysarthric.  Language intact.  CN II-XII intact.  Bulk and tone normal.  Muscle strength 5/5 throughout.  Sensation to light touch intact.  Deep tendon reflexes 2+ throughout, toes downgoing.  Gait normal.  Romberg negative.      Juliene Dunnings, DO  CC: Zachary Conger, MD      "

## 2024-05-12 ENCOUNTER — Ambulatory Visit: Admitting: Neurology

## 2024-05-12 ENCOUNTER — Encounter: Payer: Self-pay | Admitting: Neurology

## 2024-05-12 ENCOUNTER — Other Ambulatory Visit

## 2024-05-12 VITALS — BP 160/100 | HR 80 | Ht 63.0 in | Wt 182.4 lb

## 2024-05-12 DIAGNOSIS — G40219 Localization-related (focal) (partial) symptomatic epilepsy and epileptic syndromes with complex partial seizures, intractable, without status epilepticus: Secondary | ICD-10-CM | POA: Diagnosis not present

## 2024-05-12 DIAGNOSIS — Z79899 Other long term (current) drug therapy: Secondary | ICD-10-CM

## 2024-05-12 MED ORDER — CARBAMAZEPINE 100 MG PO CHEW: 300.0000 mg | CHEWABLE_TABLET | Freq: Two times a day (BID) | ORAL | 3 refills | Status: DC

## 2024-05-12 NOTE — Patient Instructions (Signed)
 Carbamazepine  300mg  twice daily Check CBC and CMP today

## 2024-05-13 ENCOUNTER — Ambulatory Visit: Payer: Self-pay | Admitting: Neurology

## 2024-05-13 LAB — CBC
HCT: 35.2 % — ABNORMAL LOW (ref 35.9–46.0)
Hemoglobin: 11.1 g/dL — ABNORMAL LOW (ref 11.7–15.5)
MCH: 27.2 pg (ref 27.0–33.0)
MCHC: 31.5 g/dL — ABNORMAL LOW (ref 31.6–35.4)
MCV: 86.3 fL (ref 81.4–101.7)
MPV: 11.7 fL (ref 7.5–12.5)
Platelets: 150 Thousand/uL (ref 140–400)
RBC: 4.08 Million/uL (ref 3.80–5.10)
RDW: 13 % (ref 11.0–15.0)
WBC: 2.8 Thousand/uL — ABNORMAL LOW (ref 3.8–10.8)

## 2024-05-13 LAB — COMPREHENSIVE METABOLIC PANEL WITH GFR
AG Ratio: 0.8 (calc) — ABNORMAL LOW (ref 1.0–2.5)
ALT: 23 U/L (ref 6–29)
AST: 36 U/L — ABNORMAL HIGH (ref 10–35)
Albumin: 3.8 g/dL (ref 3.6–5.1)
Alkaline phosphatase (APISO): 155 U/L — ABNORMAL HIGH (ref 37–153)
BUN: 9 mg/dL (ref 7–25)
CO2: 28 mmol/L (ref 20–32)
Calcium: 8.8 mg/dL (ref 8.6–10.4)
Chloride: 105 mmol/L (ref 98–110)
Creat: 0.54 mg/dL (ref 0.50–1.03)
Globulin: 4.7 g/dL — ABNORMAL HIGH (ref 1.9–3.7)
Glucose, Bld: 83 mg/dL (ref 65–99)
Potassium: 3.8 mmol/L (ref 3.5–5.3)
Sodium: 140 mmol/L (ref 135–146)
Total Bilirubin: 0.2 mg/dL (ref 0.2–1.2)
Total Protein: 8.5 g/dL — ABNORMAL HIGH (ref 6.1–8.1)
eGFR: 111 mL/min/1.73m2

## 2024-05-14 NOTE — Progress Notes (Signed)
Tried calling patient no answer. LMOVM to call the office back.

## 2024-05-14 NOTE — Telephone Encounter (Signed)
-----   Message from Juliene Dunnings, DO sent at 05/13/2024 12:32 PM EST ----- One of her liver enzymes is elevated and another is borderline elevated.  This may be due to the carbamazepine .  Therefore, I would like to switch her medication to another anti-seizure medication.   I would like to switch to lacosamide , which is a good medication and, in my experience, usually tolerated.  Would she be okay with this change?

## 2024-05-14 NOTE — Telephone Encounter (Signed)
 Patient advised.  Patient agrees to the medication change.

## 2024-05-15 ENCOUNTER — Telehealth: Payer: Self-pay | Admitting: Neurology

## 2024-05-15 ENCOUNTER — Other Ambulatory Visit: Payer: Self-pay | Admitting: Neurology

## 2024-05-15 MED ORDER — LACOSAMIDE 100 MG PO TABS: 100.0000 mg | ORAL_TABLET | Freq: Two times a day (BID) | ORAL | 5 refills | Status: AC

## 2024-05-15 MED ORDER — LACOSAMIDE 50 MG PO TABS: 50.0000 mg | ORAL_TABLET | Freq: Two times a day (BID) | ORAL | 0 refills | Status: AC

## 2024-05-15 NOTE — Telephone Encounter (Signed)
 error

## 2024-05-15 NOTE — Telephone Encounter (Signed)
 I sent in the prescription for LACOSAMIDE .  I sent in 2 prescriptions for a 50mg  tablet and a 100mg  tablet.  She should take the 50mg  tablet first:  50MG  TWICE DAILY for ONE WEEK.  Then start the 100MG  TWICE DAILY from there. At the same time, I want her to decrease CARBAMAZEPINE  to 2 pills twice daily for one week, then 1 pill twice daily for one week, then STOP. I would like to repeat a hepatic panel in 3 months (medication management)

## 2024-05-15 NOTE — Telephone Encounter (Signed)
 Pt states infusion Rx is out of stock and wants to know if she can take her normal Rx or wait until she is able to get infusion Rx

## 2024-05-25 ENCOUNTER — Ambulatory Visit: Admitting: Neurology

## 2025-05-12 ENCOUNTER — Ambulatory Visit: Payer: Self-pay | Admitting: Neurology
